# Patient Record
Sex: Female | Born: 1955
Health system: Southern US, Community
[De-identification: ages and names within clinical notes are randomized; demographics above are authoritative.]

## PROBLEM LIST (undated history)

## (undated) DIAGNOSIS — F329 Major depressive disorder, single episode, unspecified: Secondary | ICD-10-CM

## (undated) DIAGNOSIS — M797 Fibromyalgia: Secondary | ICD-10-CM

## (undated) DIAGNOSIS — M549 Dorsalgia, unspecified: Secondary | ICD-10-CM

## (undated) DIAGNOSIS — I341 Nonrheumatic mitral (valve) prolapse: Secondary | ICD-10-CM

## (undated) DIAGNOSIS — F32A Depression, unspecified: Secondary | ICD-10-CM

## (undated) DIAGNOSIS — H18509 Unspecified hereditary corneal dystrophies, unspecified eye: Secondary | ICD-10-CM

## (undated) DIAGNOSIS — M1990 Unspecified osteoarthritis, unspecified site: Secondary | ICD-10-CM

## (undated) DIAGNOSIS — K449 Diaphragmatic hernia without obstruction or gangrene: Secondary | ICD-10-CM

## (undated) DIAGNOSIS — K219 Gastro-esophageal reflux disease without esophagitis: Secondary | ICD-10-CM

## (undated) DIAGNOSIS — R011 Cardiac murmur, unspecified: Secondary | ICD-10-CM

## (undated) DIAGNOSIS — N3090 Cystitis, unspecified without hematuria: Secondary | ICD-10-CM

## (undated) DIAGNOSIS — H9191 Unspecified hearing loss, right ear: Secondary | ICD-10-CM

## (undated) HISTORY — DX: Dorsalgia, unspecified: M54.9

## (undated) HISTORY — PX: TONSILLECTOMY: SUR1361

## (undated) HISTORY — PX: WISDOM TOOTH EXTRACTION: SHX21

## (undated) HISTORY — DX: Nonrheumatic mitral (valve) prolapse: I34.1

## (undated) HISTORY — DX: Cystitis, unspecified without hematuria: N30.90

## (undated) HISTORY — DX: Diaphragmatic hernia without obstruction or gangrene: K44.9

## (undated) HISTORY — DX: Gastro-esophageal reflux disease without esophagitis: K21.9

## (undated) HISTORY — DX: Unspecified hereditary corneal dystrophies, unspecified eye: H18.509

## (undated) HISTORY — PX: COLONOSCOPY: SHX174

## (undated) HISTORY — DX: Unspecified hearing loss, right ear: H91.91

## (undated) HISTORY — PX: EXTERNAL EAR SURGERY: SHX627

---

## 1998-12-01 ENCOUNTER — Emergency Department (HOSPITAL_COMMUNITY): Admission: EM | Admit: 1998-12-01 | Discharge: 1998-12-01 | Payer: Self-pay

## 1998-12-01 ENCOUNTER — Encounter: Payer: Self-pay | Admitting: Emergency Medicine

## 1998-12-01 DIAGNOSIS — M549 Dorsalgia, unspecified: Secondary | ICD-10-CM

## 1998-12-01 HISTORY — DX: Dorsalgia, unspecified: M54.9

## 2002-05-31 ENCOUNTER — Encounter: Admission: RE | Admit: 2002-05-31 | Discharge: 2002-05-31 | Payer: Self-pay | Admitting: Sports Medicine

## 2002-06-28 ENCOUNTER — Encounter: Admission: RE | Admit: 2002-06-28 | Discharge: 2002-06-28 | Payer: Self-pay | Admitting: Sports Medicine

## 2002-07-09 ENCOUNTER — Encounter: Admission: RE | Admit: 2002-07-09 | Discharge: 2002-07-09 | Payer: Self-pay | Admitting: Family Medicine

## 2002-08-02 ENCOUNTER — Encounter: Admission: RE | Admit: 2002-08-02 | Discharge: 2002-08-02 | Payer: Self-pay | Admitting: Sports Medicine

## 2002-08-30 ENCOUNTER — Encounter: Payer: Self-pay | Admitting: Sports Medicine

## 2002-08-30 ENCOUNTER — Encounter: Admission: RE | Admit: 2002-08-30 | Discharge: 2002-08-30 | Payer: Self-pay | Admitting: Sports Medicine

## 2002-09-04 ENCOUNTER — Encounter: Payer: Self-pay | Admitting: Sports Medicine

## 2002-09-04 ENCOUNTER — Encounter: Admission: RE | Admit: 2002-09-04 | Discharge: 2002-09-04 | Payer: Self-pay | Admitting: Sports Medicine

## 2002-09-06 ENCOUNTER — Encounter: Admission: RE | Admit: 2002-09-06 | Discharge: 2002-09-06 | Payer: Self-pay | Admitting: Sports Medicine

## 2002-10-11 ENCOUNTER — Encounter: Admission: RE | Admit: 2002-10-11 | Discharge: 2002-10-11 | Payer: Self-pay | Admitting: Sports Medicine

## 2003-02-12 ENCOUNTER — Other Ambulatory Visit: Admission: RE | Admit: 2003-02-12 | Discharge: 2003-02-12 | Payer: Self-pay | Admitting: Obstetrics and Gynecology

## 2003-06-20 ENCOUNTER — Encounter: Admission: RE | Admit: 2003-06-20 | Discharge: 2003-06-20 | Payer: Self-pay | Admitting: Sports Medicine

## 2004-02-19 ENCOUNTER — Other Ambulatory Visit: Admission: RE | Admit: 2004-02-19 | Discharge: 2004-02-19 | Payer: Self-pay | Admitting: *Deleted

## 2005-06-23 ENCOUNTER — Encounter: Admission: RE | Admit: 2005-06-23 | Discharge: 2005-06-23 | Payer: Self-pay | Admitting: *Deleted

## 2005-06-28 ENCOUNTER — Ambulatory Visit: Payer: Self-pay | Admitting: Sports Medicine

## 2005-07-19 ENCOUNTER — Other Ambulatory Visit: Admission: RE | Admit: 2005-07-19 | Discharge: 2005-07-19 | Payer: Self-pay | Admitting: Obstetrics and Gynecology

## 2006-10-19 ENCOUNTER — Other Ambulatory Visit: Admission: RE | Admit: 2006-10-19 | Discharge: 2006-10-19 | Payer: Self-pay | Admitting: Obstetrics & Gynecology

## 2006-11-22 ENCOUNTER — Encounter: Payer: Self-pay | Admitting: Sports Medicine

## 2006-11-22 ENCOUNTER — Encounter: Admission: RE | Admit: 2006-11-22 | Discharge: 2006-11-22 | Payer: Self-pay | Admitting: Obstetrics and Gynecology

## 2007-08-08 ENCOUNTER — Ambulatory Visit: Payer: Self-pay | Admitting: Sports Medicine

## 2007-08-08 ENCOUNTER — Encounter: Admission: RE | Admit: 2007-08-08 | Discharge: 2007-08-08 | Payer: Self-pay | Admitting: Sports Medicine

## 2007-08-08 DIAGNOSIS — M79609 Pain in unspecified limb: Secondary | ICD-10-CM | POA: Insufficient documentation

## 2007-08-08 DIAGNOSIS — M8580 Other specified disorders of bone density and structure, unspecified site: Secondary | ICD-10-CM | POA: Insufficient documentation

## 2007-08-14 ENCOUNTER — Ambulatory Visit: Payer: Self-pay | Admitting: Sports Medicine

## 2007-08-14 DIAGNOSIS — M7750 Other enthesopathy of unspecified foot: Secondary | ICD-10-CM | POA: Insufficient documentation

## 2007-08-14 DIAGNOSIS — M2170 Unequal limb length (acquired), unspecified site: Secondary | ICD-10-CM | POA: Insufficient documentation

## 2007-08-14 DIAGNOSIS — M21619 Bunion of unspecified foot: Secondary | ICD-10-CM | POA: Insufficient documentation

## 2007-08-17 ENCOUNTER — Encounter (INDEPENDENT_AMBULATORY_CARE_PROVIDER_SITE_OTHER): Payer: Self-pay | Admitting: *Deleted

## 2007-08-28 ENCOUNTER — Encounter: Payer: Self-pay | Admitting: Sports Medicine

## 2007-10-25 ENCOUNTER — Other Ambulatory Visit: Admission: RE | Admit: 2007-10-25 | Discharge: 2007-10-25 | Payer: Self-pay | Admitting: Obstetrics and Gynecology

## 2007-11-28 ENCOUNTER — Encounter: Admission: RE | Admit: 2007-11-28 | Discharge: 2007-11-28 | Payer: Self-pay | Admitting: Obstetrics and Gynecology

## 2008-10-27 ENCOUNTER — Other Ambulatory Visit: Admission: RE | Admit: 2008-10-27 | Discharge: 2008-10-27 | Payer: Self-pay | Admitting: Obstetrics & Gynecology

## 2008-12-16 ENCOUNTER — Encounter: Admission: RE | Admit: 2008-12-16 | Discharge: 2008-12-16 | Payer: Self-pay | Admitting: Obstetrics & Gynecology

## 2008-12-19 ENCOUNTER — Encounter: Admission: RE | Admit: 2008-12-19 | Discharge: 2008-12-19 | Payer: Self-pay | Admitting: Obstetrics & Gynecology

## 2009-10-07 ENCOUNTER — Ambulatory Visit: Payer: Self-pay | Admitting: Cardiology

## 2009-10-07 ENCOUNTER — Observation Stay (HOSPITAL_COMMUNITY): Admission: EM | Admit: 2009-10-07 | Discharge: 2009-10-08 | Payer: Self-pay | Admitting: Emergency Medicine

## 2009-10-08 ENCOUNTER — Encounter (INDEPENDENT_AMBULATORY_CARE_PROVIDER_SITE_OTHER): Payer: Self-pay | Admitting: Emergency Medicine

## 2010-11-16 ENCOUNTER — Encounter
Admission: RE | Admit: 2010-11-16 | Discharge: 2010-11-16 | Payer: Self-pay | Source: Home / Self Care | Attending: Obstetrics & Gynecology | Admitting: Obstetrics & Gynecology

## 2010-11-28 ENCOUNTER — Encounter: Payer: Self-pay | Admitting: Obstetrics & Gynecology

## 2011-02-08 LAB — BASIC METABOLIC PANEL WITH GFR
BUN: 10 mg/dL (ref 6–23)
CO2: 24 meq/L (ref 19–32)
Calcium: 8.5 mg/dL (ref 8.4–10.5)
Chloride: 110 meq/L (ref 96–112)
Creatinine, Ser: 1.03 mg/dL (ref 0.4–1.2)
GFR calc non Af Amer: 56 mL/min — ABNORMAL LOW
Glucose, Bld: 107 mg/dL — ABNORMAL HIGH (ref 70–99)
Potassium: 3.8 meq/L (ref 3.5–5.1)
Sodium: 141 meq/L (ref 135–145)

## 2011-02-08 LAB — POCT CARDIAC MARKERS
CKMB, poc: 1 ng/mL (ref 1.0–8.0)
CKMB, poc: 1.2 ng/mL (ref 1.0–8.0)
CKMB, poc: <1 ng/mL — ABNORMAL LOW (ref 1.0–8.0)
Myoglobin, poc: 36.8 ng/mL (ref 12–200)
Myoglobin, poc: 43.9 ng/mL (ref 12–200)
Myoglobin, poc: 44.1 ng/mL (ref 12–200)
Troponin i, poc: <0.05 ng/mL (ref 0.00–0.09)
Troponin i, poc: <0.05 ng/mL (ref 0.00–0.09)

## 2011-02-08 LAB — CBC
Hemoglobin: 11.7 g/dL — ABNORMAL LOW (ref 12.0–15.0)
MCHC: 33.7 g/dL (ref 30.0–36.0)

## 2011-02-08 LAB — CK TOTAL AND CKMB (NOT AT ARMC)
CK, MB: 1 ng/mL (ref 0.3–4.0)
Total CK: 83 U/L (ref 7–177)

## 2011-02-08 LAB — TROPONIN I

## 2011-02-08 LAB — D-DIMER, QUANTITATIVE

## 2011-11-11 ENCOUNTER — Other Ambulatory Visit: Payer: Self-pay | Admitting: Obstetrics & Gynecology

## 2011-11-11 DIAGNOSIS — Z1231 Encounter for screening mammogram for malignant neoplasm of breast: Secondary | ICD-10-CM

## 2011-11-29 ENCOUNTER — Ambulatory Visit
Admission: RE | Admit: 2011-11-29 | Discharge: 2011-11-29 | Disposition: A | Discharge status: Home / Self Care | Payer: BC Managed Care – PPO | Source: Ambulatory Visit | Attending: Obstetrics & Gynecology | Admitting: Obstetrics & Gynecology

## 2011-11-29 DIAGNOSIS — Z1231 Encounter for screening mammogram for malignant neoplasm of breast: Secondary | ICD-10-CM

## 2012-12-13 ENCOUNTER — Other Ambulatory Visit: Payer: Self-pay | Admitting: Nurse Practitioner

## 2012-12-13 DIAGNOSIS — Z1231 Encounter for screening mammogram for malignant neoplasm of breast: Secondary | ICD-10-CM

## 2012-12-13 DIAGNOSIS — M8580 Other specified disorders of bone density and structure, unspecified site: Secondary | ICD-10-CM

## 2013-01-17 ENCOUNTER — Ambulatory Visit
Admission: RE | Admit: 2013-01-17 | Discharge: 2013-01-17 | Disposition: A | Discharge status: Home / Self Care | Payer: BC Managed Care – PPO | Source: Ambulatory Visit | Attending: Nurse Practitioner | Admitting: Nurse Practitioner

## 2013-01-17 ENCOUNTER — Other Ambulatory Visit: Payer: Self-pay | Admitting: Nurse Practitioner

## 2013-01-17 DIAGNOSIS — M8580 Other specified disorders of bone density and structure, unspecified site: Secondary | ICD-10-CM

## 2013-01-17 DIAGNOSIS — Z1231 Encounter for screening mammogram for malignant neoplasm of breast: Secondary | ICD-10-CM

## 2013-12-10 ENCOUNTER — Other Ambulatory Visit: Payer: Self-pay

## 2013-12-10 DIAGNOSIS — Z1231 Encounter for screening mammogram for malignant neoplasm of breast: Secondary | ICD-10-CM

## 2013-12-19 ENCOUNTER — Encounter: Payer: Self-pay | Admitting: Nurse Practitioner

## 2013-12-19 ENCOUNTER — Ambulatory Visit (INDEPENDENT_AMBULATORY_CARE_PROVIDER_SITE_OTHER): Payer: BC Managed Care – PPO | Admitting: Nurse Practitioner

## 2013-12-19 VITALS — BP 124/76 | HR 84 | Ht 67.25 in | Wt 147.0 lb

## 2013-12-19 DIAGNOSIS — Z01419 Encounter for gynecological examination (general) (routine) without abnormal findings: Secondary | ICD-10-CM

## 2013-12-19 DIAGNOSIS — E559 Vitamin D deficiency, unspecified: Secondary | ICD-10-CM

## 2013-12-19 DIAGNOSIS — Z0000 Encounter for general adult medical examination without abnormal findings: Secondary | ICD-10-CM

## 2013-12-19 LAB — HEMOGLOBIN, FINGERSTICK: Hemoglobin, fingerstick: 12.4 g/dL (ref 12.0–16.0)

## 2013-12-19 MED ORDER — ESTRADIOL-NORETHINDRONE ACET 1-0.5 MG PO TABS: 1.0000 | ORAL_TABLET | Freq: Every day | ORAL | Status: DC

## 2013-12-19 NOTE — Progress Notes (Signed)
Patient ID: Courtney Waters, female   DOB: Oct 19, 1956, 58 y.o.   MRN: [id number] 58 y.o. [id number] Married Caucasian Fe here for annual exam.  Doing well on 1/2 tablet of HRT will now try to decrease dosage even more by the summer.  Mother age 72 with recent fall and in Rehab for a fracture of the cervical neck.  No LMP recorded. Patient is postmenopausal.          Sexually active: yes  The current method of family planning is post menopausal status.    Exercising: yes  [employer] class @ [employer] three times per week Smoker:  no  Health Maintenance: Pap:  12/13/11, WNL, neg HR HPV MMG:  01/17/13, normal Colonoscopy:  10/05/05 will schedule BMD:   01/2013, -0.6/1.0 TDaP:  01/24/07 Labs: HB:  12.4 Urine:  Negative   reports that she has never smoked. She has never used smokeless tobacco. She reports that she drinks about 1.0 ounces of alcohol per week. She reports that she does not use illicit drugs.  Past Medical History  Diagnosis Date  . Hearing loss in right ear   . MVP (mitral valve prolapse)   . Back pain 12/01/1998    compression fracture L1 from sledding accident    Past Surgical History  Procedure Laterality Date  . Tonsillectomy  age 30  . Wisdom tooth extraction  age 57  . 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 Biotin 5000 MCG CAPS Take 1 capsule by mouth daily.      . Cholecalciferol (VITAMIN D) 2000 UNITS CAPS Take 1 capsule by mouth daily.      . DULoxetine (CYMBALTA) 60 MG capsule Take 1 capsule by mouth daily.      Marland Kitchen estradiol-norethindrone (MIMVEY) 1-0.5 MG per tablet Take 1 tablet by mouth daily.  90 tablet  1  . methocarbamol (ROBAXIN) 500 MG tablet Take 1 tablet by mouth 3 (three) times daily.      Marland Kitchen oxyCODONE-acetaminophen (PERCOCET/ROXICET) 5-325 MG per tablet Take 1 tablet by mouth as needed.      . Turmeric 500 MG  CAPS Take 1 capsule by mouth daily.       No current facility-administered medications for this visit.    Family History  Problem Relation Age of Onset  . Diabetes Mother   . Hearing loss Mother   . Cancer Father     GI  . Breast cancer Maternal Aunt   . Heart disease Sister   . Hearing loss Brother     ROS:  Pertinent items are noted in HPI.  Otherwise, a comprehensive ROS was negative.  Exam:   BP 124/76  Pulse 84  Ht 5' 7.25" (1.708 m)  Wt 147 lb (66.679 kg)  BMI 22.86 kg/m2 Height: 5' 7.25" (170.8 cm)  Ht Readings from Last 3 Encounters:  12/19/13 5' 7.25" (1.708 m)    General appearance: alert, cooperative and appears stated age Head: Normocephalic, without obvious abnormality, atraumatic Neck: no adenopathy, supple, symmetrical, trachea midline and thyroid normal to inspection and palpation Lungs: clear to auscultation bilaterally Breasts: normal appearance, no masses or tenderness Heart: regular rate and rhythm Abdomen: soft, non-tender; no masses,  no organomegaly Extremities: extremities normal, atraumatic, no cyanosis or edema Skin: Skin color, texture, turgor normal. No rashes or lesions Lymph nodes: Cervical, supraclavicular, and  axillary nodes normal. No abnormal inguinal nodes palpated Neurologic: Grossly normal   Pelvic: External genitalia:  no lesions              Urethra:  normal appearing urethra with no masses, tenderness or lesions              Bartholin's and Skene's: normal                 Vagina: normal appearing vagina with normal color and discharge, no lesions              Cervix: anteverted              Pap taken: no Bimanual Exam:  Uterus:  normal size, contour, position, consistency, mobility, non-tender              Adnexa: no mass, fullness, tenderness               Rectovaginal: Confirms               Anus:  normal sphincter tone, no lesions  A:  Well Woman with normal exam  Postmenopausal on HRT  Tapering dose of HRT  History of  fibromyalgia and chronic back pain  Vit D deficiency  P:   Pap smear as per guidelines   Mammogram due 01/2014  Refill on HRT for a year, will hopefully be tapering off by the summer if tolerated.  Counseled on breast self exam, mammography screening, adequate intake of calcium and vitamin D, diet and exercise return annually or prn  An After Visit Summary was printed and given to the patient.

## 2013-12-19 NOTE — Patient Instructions (Signed)

## 2013-12-20 LAB — VITAMIN D 25 HYDROXY (VIT D DEFICIENCY, FRACTURES): VIT D 25 HYDROXY: 49 ng/mL (ref 30–89)

## 2013-12-24 ENCOUNTER — Telehealth: Payer: Self-pay | Admitting: *Deleted

## 2013-12-24 NOTE — Telephone Encounter (Signed)
Message copied by Graylon Good on Tue Dec 24, 2013 12:20 PM ------      Message from: Kem Boroughs R      Created: Mon Dec 23, 2013 10:16 AM       Let patient know that Vit D is in a good range @ her OTC dosing of VIt D ------

## 2013-12-24 NOTE — Progress Notes (Signed)
Encounter reviewed by Dr. Brook Silva.  

## 2013-12-24 NOTE — Telephone Encounter (Signed)
I have attempted to contact this patient by phone with the following results: no answer.

## 2014-01-09 NOTE — Telephone Encounter (Signed)
Pt notified in result note on 12/31/13.

## 2014-01-21 ENCOUNTER — Ambulatory Visit
Admission: RE | Admit: 2014-01-21 | Discharge: 2014-01-21 | Disposition: A | Discharge status: Home / Self Care | Payer: BC Managed Care – PPO | Source: Ambulatory Visit

## 2014-01-21 DIAGNOSIS — Z1231 Encounter for screening mammogram for malignant neoplasm of breast: Secondary | ICD-10-CM

## 2014-04-15 ENCOUNTER — Other Ambulatory Visit: Payer: Self-pay | Admitting: Internal Medicine

## 2014-04-15 DIAGNOSIS — IMO0002 Reserved for concepts with insufficient information to code with codable children: Secondary | ICD-10-CM

## 2014-04-15 DIAGNOSIS — M545 Low back pain, unspecified: Secondary | ICD-10-CM

## 2014-04-17 ENCOUNTER — Ambulatory Visit
Admission: RE | Admit: 2014-04-17 | Discharge: 2014-04-17 | Disposition: A | Discharge status: Home / Self Care | Payer: BC Managed Care – PPO | Source: Ambulatory Visit | Attending: Internal Medicine | Admitting: Internal Medicine

## 2014-04-17 DIAGNOSIS — M545 Low back pain, unspecified: Secondary | ICD-10-CM

## 2014-04-17 DIAGNOSIS — IMO0002 Reserved for concepts with insufficient information to code with codable children: Secondary | ICD-10-CM

## 2014-04-24 ENCOUNTER — Telehealth: Payer: Self-pay | Admitting: Obstetrics & Gynecology

## 2014-04-24 DIAGNOSIS — N83209 Unspecified ovarian cyst, unspecified side: Secondary | ICD-10-CM

## 2014-04-24 NOTE — Telephone Encounter (Signed)
Patient had MR lumbar spine wo contrast on 6/11. Results in EPIC. Was advised that PUS is recommended.   Routing to [place] for review. Okay to schedule PUS at this time?  CC: Milford Cage, [place]

## 2014-04-24 NOTE — Telephone Encounter (Signed)
Pt had a MRI done and was told she had an ovarian cyst. Pt was told she should have an ultrasound sound done.

## 2014-04-25 NOTE — Telephone Encounter (Signed)
Spoke with patient. Advised of message as seen below from [place]. Patient agreeable. PUS scheduled for 6/25 at 3:00pm and consult at 3:30 with Dr.Miller. Patient agreeable to date and time. Advised would be contacted by billing regarding benefits if anything more than office copay is required for visit. Patient is agreeable.  PUS order placed for precert.  Routing to provider for final review. Patient agreeable to disposition. Will close encounter

## 2014-04-25 NOTE — Telephone Encounter (Signed)
Yes.  Please schedule PUS.  This will most likely need removal so needs to be on my or Dr. Elza Rafter schedule.

## 2014-05-01 ENCOUNTER — Ambulatory Visit (INDEPENDENT_AMBULATORY_CARE_PROVIDER_SITE_OTHER): Payer: BC Managed Care – PPO

## 2014-05-01 ENCOUNTER — Other Ambulatory Visit: Payer: Self-pay | Admitting: Obstetrics & Gynecology

## 2014-05-01 ENCOUNTER — Ambulatory Visit (INDEPENDENT_AMBULATORY_CARE_PROVIDER_SITE_OTHER): Payer: BC Managed Care – PPO | Admitting: Obstetrics & Gynecology

## 2014-05-01 VITALS — BP 120/78 | Ht 67.25 in | Wt 150.4 lb

## 2014-05-01 DIAGNOSIS — N839 Noninflammatory disorder of ovary, fallopian tube and broad ligament, unspecified: Secondary | ICD-10-CM

## 2014-05-01 DIAGNOSIS — N83209 Unspecified ovarian cyst, unspecified side: Secondary | ICD-10-CM

## 2014-05-01 DIAGNOSIS — N838 Other noninflammatory disorders of ovary, fallopian tube and broad ligament: Secondary | ICD-10-CM

## 2014-05-01 NOTE — Progress Notes (Signed)
58 y.o.Marriedfemale G2P2 her for PUS to further evaluate an enlarged cystic ovarian mass found on MRI of the spine.  This was done due to increased pian of the back.  Pt has fibromyalgia and lives with chronic pain, however, the back has been much worse lately.  MRI was done 04/17/14.  No LMP recorded. Patient is postmenopausal.  Sexually active:  yes  Contraception: PMP status  FINDINGS: UTERUS: 5.9 x 3.2 x 2.8cm without fibroids EMS: 2.4 mm ADNEXA:   Left ovary cannot be clearly seen but a 12.5 x 8.9 x 7.5cm avascular, cystic, lesion is seen.  A few thin septations are noted. These are avascular as well.   Right ovary 1.7 x 1.0 x 1.0cm CUL DE SAC: no free fluid in upper or lower abdomen  Images reviewed with pt.  Feel this is either a serous or mucinous cystadenoma.  Will obtain a ca-125 first.  If normal, plan to proceed with laparoscopic LSO vs BSO.  I have encouraged pt to consider having both ovaries removed as this minimally increases risks of surgery and length of surgery.  She is agreeable.   Will want to come back wit spouse for discussion before surgery.  Reviewed all parts of ultrasound with pt, specifically what makes me think this is benign.  Surgery reviewed.  Hospital stay discussed.  Risks and benefits discussed including infection, bleeding, transfusion, bowel/bladder/ureteral injury, DVT.  Pt voices understanding.  All questions answered.  Assessment:  Large 12.5cm left cystic adnexal mass Plan: ca-125.  If normal, plan to proceed with surgical removal.  ~25 minutes spent with patient >50% of time was in face to face discussion of above.

## 2014-05-02 LAB — CA 125: CA 125: 19.3 U/mL (ref 0.0–30.2)

## 2014-05-05 ENCOUNTER — Telehealth: Payer: Self-pay | Admitting: Obstetrics & Gynecology

## 2014-05-05 NOTE — Telephone Encounter (Signed)
Spoke with pt personally.  Will try to proceed with getting surgery scheduled next week, if possible.

## 2014-05-05 NOTE — Telephone Encounter (Signed)
Routing to [place] for review and advise of lab from 6/25.

## 2014-05-05 NOTE — Telephone Encounter (Signed)
Patient is calling saying kelly was supposed to call her Friday with results from blood work and she hasn't gotten a call yet.

## 2014-05-05 NOTE — Telephone Encounter (Signed)
Patient calling regarding results. Advised result information is still pending Dr.Miller's review. Advised would call back as soon as Dr.Miller has had a chance to review. Patient would like call back before the end of the day today.

## 2014-05-06 ENCOUNTER — Telehealth: Payer: Self-pay | Admitting: *Deleted

## 2014-05-06 NOTE — Telephone Encounter (Signed)
Call to patient, notified of surgery scheudled for Tuesday 05-13-14 at 1300 at [facility]. Surgery instruction sheet reviewed and will mail.  Consult with Dr Sabra Heck scheduled for 05-12-14.  Routing to provider for final review. Patient agreeable to disposition. Will close encounter

## 2014-05-06 NOTE — Telephone Encounter (Signed)
Surgery is scheduled.  Encounter closed.

## 2014-05-07 ENCOUNTER — Telehealth: Payer: Self-pay | Admitting: Obstetrics & Gynecology

## 2014-05-07 NOTE — Telephone Encounter (Signed)
Spoke with patient. Advised that per benefit quote received, she will be responsible for $1610.70 for the surgeons portion of her surgery. Patient requested that I call her back this evening because she has fibromyalgia and starts kind of slow in the morning. Agreed to call patient back at 1530.

## 2014-05-08 ENCOUNTER — Encounter (HOSPITAL_COMMUNITY): Payer: Self-pay | Admitting: *Deleted

## 2014-05-08 NOTE — Telephone Encounter (Signed)
Spoke with patient. Reiterated out of pocket amount due for the surgeons portion of her surgery. Advised that payment is due in full prior to scheduled surgery. Patient states that her husband would like to speak with someone in the office about setting up a payment arrangement for this amount. Passed call to [place].

## 2014-05-08 NOTE — Telephone Encounter (Signed)
Per notes in EPIC Account Notes: Patient asked that I speak with husband to set up payment arrangement for surgery. Paid $610.72 today and will pay remainder balance of $1,000.00 within 2 months. [place]

## 2014-05-08 NOTE — Telephone Encounter (Signed)
Promissory note drafted for patient to sign at pre-op appt 05/12/2014:: PROMISSORY NOTE  This Promissory Note is entered into by:  Shelda Pal  of [place]. [place], [place] [address].  [place], [address], [place], [place], [place]  [address]  The patient or responsible party agrees to pay the sum of $1000.00  to [facility].  Payments are to be made on a monthly basis beginning: June 08, 2014 continuing (monthly payments will be $500.00 ) until the balance is paid in full.  Patient and/or responsible party understands that payment must be made every thirty (30) days regardless of receipt of a statement.  Patient and/or responsible party understands that any time payments are not made as required, account may be turned over to a collection agency with no further notice.  Any and all fees assessed due to collection agency referral will be the responsibility of the patient and/or responsible party.  Patient and/or responsible party understands that any Medicaid benefits are not a permissible form of payment now or in the future.

## 2014-05-08 NOTE — Patient Instructions (Addendum)
   Your procedure is scheduled on:  Tuesday, July 7   Enter through the [employer] [facility] at:  [place] up the phone at the desk and dial [phone] and inform us of your arrival.  Please call this number if you have any problems the morning of surgery: [phone]  Remember: Do not eat food after midnight:  Monday Do not drink clear liquids after:  9 AM Tuesday, day of surgery Take these medicines the morning of surgery with a SIP OF WATER:  Cymbalta, percocet and robaxin if needed.  Do not wear jewelry, make-up, or FINGER nail polish No metal in your hair or on your body. Do not wear lotions, powders, perfumes.  You may wear deodorant.  Do not bring valuables to the hospital. Contacts, dentures or bridgework may not be worn into surgery.  Leave suitcase in the car. After Surgery it may be brought to your room. For patients being admitted to the hospital, checkout time is 11:00am the day of discharge.    Patients discharged on the day of surgery will not be allowed to drive home.  Home with husband Rob cell [phone].

## 2014-05-09 ENCOUNTER — Encounter (HOSPITAL_COMMUNITY): Payer: Self-pay | Admitting: Pharmacist

## 2014-05-12 ENCOUNTER — Encounter (HOSPITAL_COMMUNITY)
Admission: RE | Admit: 2014-05-12 | Discharge: 2014-05-12 | Disposition: A | Discharge status: Home / Self Care | Payer: BC Managed Care – PPO | Source: Ambulatory Visit | Attending: Obstetrics & Gynecology | Admitting: Obstetrics & Gynecology

## 2014-05-12 ENCOUNTER — Encounter (HOSPITAL_COMMUNITY): Payer: Self-pay

## 2014-05-12 ENCOUNTER — Encounter: Payer: Self-pay | Admitting: Obstetrics & Gynecology

## 2014-05-12 ENCOUNTER — Ambulatory Visit (INDEPENDENT_AMBULATORY_CARE_PROVIDER_SITE_OTHER): Payer: BC Managed Care – PPO | Admitting: Obstetrics & Gynecology

## 2014-05-12 VITALS — BP 102/64 | HR 64 | Resp 16 | Wt 149.4 lb

## 2014-05-12 DIAGNOSIS — N838 Other noninflammatory disorders of ovary, fallopian tube and broad ligament: Secondary | ICD-10-CM

## 2014-05-12 DIAGNOSIS — N839 Noninflammatory disorder of ovary, fallopian tube and broad ligament, unspecified: Secondary | ICD-10-CM

## 2014-05-12 HISTORY — DX: Major depressive disorder, single episode, unspecified: F32.9

## 2014-05-12 HISTORY — DX: Unspecified osteoarthritis, unspecified site: M19.90

## 2014-05-12 HISTORY — DX: Depression, unspecified: F32.A

## 2014-05-12 LAB — CBC
HEMATOCRIT: 36.7 % (ref 36.0–46.0)
HEMOGLOBIN: 11.9 g/dL — AB (ref 12.0–15.0)
MCH: 27.9 pg (ref 26.0–34.0)
MCHC: 32.4 g/dL (ref 30.0–36.0)
MCV: 85.9 fL (ref 78.0–100.0)
Platelets: 312 10*3/uL (ref 150–400)
RBC: 4.27 MIL/uL (ref 3.87–5.11)
RDW: 12.6 % (ref 11.5–15.5)
WBC: 5.9 10*3/uL (ref 4.0–10.5)

## 2014-05-12 MED ORDER — OXYCODONE-ACETAMINOPHEN 5-325 MG PO TABS: 1.0000 | ORAL_TABLET | ORAL | Status: DC | PRN

## 2014-05-12 NOTE — Addendum Note (Signed)
Addended by: Megan Salon on: 05/12/2014 04:58 PM   Modules accepted: Orders

## 2014-05-12 NOTE — Progress Notes (Signed)
58 y.o. [id number] MarriedCaucasian female here for discussion of upcoming procedure.  Laparoscopic bilateral salpingo oophorectomy planned due to 12 cm cystic lesion seen initially on MRI done due to low back pain.  Lesion was not completely seen.  She was advised to follow up with gynecologist.  Ultrasound performed last week showed 12.5 x 9 x 7.5cm lesion with septations.  Lesion is avascular and smooth walled.  No free fluid was noted.  Pt wishes for quick removal due to some plans at the end of the month.  Procedure discussed with patient.  Hospital stay, recovery and pain management all discussed.  Risks discussed including but not limited to bleeding, 1% risk of receiving a  transfusion, infection, 1-2% risk of bowel/bladder/ureteral/vascular injury discussed as well as possible need for additional surgery if injury does occur discussed.  DVT/PE and rare risk of death discussed.  My actual complications with prior surgeries discussed.  Hernia formation discussed.  Positioning and incision locations discussed.  Patient aware if pathology abnormal she may need additional treatment.  All questions answered.    Spouse accompanied patient and also had many questions which were addressed.  Ob Hx:   No LMP recorded. Patient is postmenopausal.          Sexually active: Yes.   Birth control: PMP state Last pap: 12/13/11 WNL/negative HR HPV Last MMG: 01/21/14 3D-normal  Tobacco: none  Past Surgical History  Procedure Laterality Date  . Tonsillectomy  age 38  . Wisdom tooth extraction  age 58  . 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 Colonoscopy      Past Medical History  Diagnosis Date  . Hearing loss in right ear   . MVP (mitral valve prolapse)     Hx  . Back pain 12/01/1998    compression fracture L1 from sledding accident  . SVD (spontaneous vaginal delivery)     x 2  . Depression   . Arthritis     hands  and neck  . Fibromyalgia     Allergies: Review of patient's allergies indicates no known allergies.  Current Outpatient Prescriptions  Medication Sig Dispense Refill  . Biotin 5000 MCG CAPS Take 1 capsule by mouth daily.      . Calcium Citrate-Vitamin D (CALCIUM CITRATE + D3 MAXIMUM) 315-250 MG-UNIT TABS Take 1 tablet by mouth daily.      . Cholecalciferol (VITAMIN D) 2000 UNITS CAPS Take 1 capsule by mouth daily.      . DULoxetine (CYMBALTA) 60 MG capsule Take 1 capsule by mouth daily.      Marland Kitchen estradiol-norethindrone (ACTIVELLA) 1-0.5 MG per tablet Take 0.5 tablets by mouth every other day. Takes on even days      . methocarbamol (ROBAXIN) 500 MG tablet Take 1 tablet by mouth 3 (three) times daily as needed for muscle spasms.       Marland Kitchen oxyCODONE-acetaminophen (PERCOCET/ROXICET) 5-325 MG per tablet Take 1 tablet by mouth every 6 (six) hours as needed for moderate pain.        No current facility-administered medications for this visit.    ROS: A comprehensive review of systems was negative.  Exam:    BP 102/64  Pulse 64  Resp 16  Wt 149 lb 6.4 oz (67.767 kg)  General appearance: alert and cooperative Head: Normocephalic, without obvious abnormality, atraumatic Neck: no adenopathy, supple, symmetrical, trachea midline and thyroid not enlarged, symmetric,  no tenderness/mass/nodules Lungs: clear to auscultation bilaterally Heart: regular rate and rhythm, S1, S2 normal, no murmur, click, rub or gallop Abdomen: soft, non-tender; bowel sounds normal; no masses,  no organomegaly Extremities: extremities normal, atraumatic, no cyanosis or edema Skin: Skin color, texture, turgor normal. No rashes or lesions Lymph nodes: Cervical, supraclavicular, and axillary nodes normal. no inguinal nodes palpated Neurologic: Grossly normal  Pelvic: External genitalia:  no lesions              Urethra: normal appearing urethra with no masses, tenderness or lesions              Bartholins and Skenes:  normal                 Vagina: normal appearing vagina with normal color and discharge, no lesions              Cervix: normal appearance              Pap taken: No.        Bimanual Exam:  Uterus:  uterus is normal size, shape, consistency and nontender                                      Adnexa:    Left fullness, non tender, no right sided mass                                      Rectovaginal: Deferred                                      Anus:  defer exam  A: Left adnexal mass, 12 cm    P:  Bilateral salpingoophrectomy planned as pt wishes both ovaries to be removed Rx for Percocet given. Medications/Vitamins reviewed. Pt has stopped her vitamins. Orders placed.  ~30 minutes spent with patient >50% of time was in face to face discussion of above.

## 2014-05-13 ENCOUNTER — Encounter (HOSPITAL_COMMUNITY): Payer: Self-pay | Admitting: Anesthesiology

## 2014-05-13 ENCOUNTER — Ambulatory Visit (HOSPITAL_COMMUNITY)
Admission: RE | Admit: 2014-05-13 | Discharge: 2014-05-13 | Disposition: A | Discharge status: Home / Self Care | Payer: BC Managed Care – PPO | Source: Ambulatory Visit | Attending: Obstetrics & Gynecology | Admitting: Obstetrics & Gynecology

## 2014-05-13 ENCOUNTER — Encounter (HOSPITAL_COMMUNITY)
Admission: RE | Disposition: A | Discharge status: Home / Self Care | Payer: Self-pay | Source: Ambulatory Visit | Attending: Obstetrics & Gynecology

## 2014-05-13 ENCOUNTER — Ambulatory Visit (HOSPITAL_COMMUNITY): Payer: BC Managed Care – PPO | Admitting: Anesthesiology

## 2014-05-13 ENCOUNTER — Encounter (HOSPITAL_COMMUNITY): Payer: BC Managed Care – PPO | Admitting: Anesthesiology

## 2014-05-13 DIAGNOSIS — N838 Other noninflammatory disorders of ovary, fallopian tube and broad ligament: Secondary | ICD-10-CM

## 2014-05-13 DIAGNOSIS — F3289 Other specified depressive episodes: Secondary | ICD-10-CM | POA: Insufficient documentation

## 2014-05-13 DIAGNOSIS — M545 Low back pain, unspecified: Secondary | ICD-10-CM

## 2014-05-13 DIAGNOSIS — Z833 Family history of diabetes mellitus: Secondary | ICD-10-CM | POA: Insufficient documentation

## 2014-05-13 DIAGNOSIS — I059 Rheumatic mitral valve disease, unspecified: Secondary | ICD-10-CM | POA: Insufficient documentation

## 2014-05-13 DIAGNOSIS — D3910 Neoplasm of uncertain behavior of unspecified ovary: Secondary | ICD-10-CM

## 2014-05-13 DIAGNOSIS — F329 Major depressive disorder, single episode, unspecified: Secondary | ICD-10-CM | POA: Insufficient documentation

## 2014-05-13 DIAGNOSIS — IMO0001 Reserved for inherently not codable concepts without codable children: Secondary | ICD-10-CM | POA: Insufficient documentation

## 2014-05-13 DIAGNOSIS — D279 Benign neoplasm of unspecified ovary: Secondary | ICD-10-CM | POA: Insufficient documentation

## 2014-05-13 HISTORY — DX: Fibromyalgia: M79.7

## 2014-05-13 HISTORY — PX: LAPAROSCOPIC BILATERAL SALPINGO OOPHERECTOMY: SHX5890

## 2014-05-13 SURGERY — SALPINGO-OOPHORECTOMY, BILATERAL, LAPAROSCOPIC
Anesthesia: General | Site: Abdomen | Laterality: Bilateral

## 2014-05-13 MED ORDER — ROCURONIUM BROMIDE 100 MG/10ML IV SOLN
INTRAVENOUS | Status: AC
  Filled 2014-05-13: qty 1

## 2014-05-13 MED ORDER — ONDANSETRON HCL 4 MG/2ML IJ SOLN
INTRAMUSCULAR | Status: AC
  Filled 2014-05-13: qty 2

## 2014-05-13 MED ORDER — OXYCODONE HCL 5 MG PO TABS: 5.0000 mg | ORAL_TABLET | Freq: Once | ORAL | Status: DC | PRN

## 2014-05-13 MED ORDER — GLYCOPYRROLATE 0.2 MG/ML IJ SOLN
INTRAMUSCULAR | Status: AC
  Filled 2014-05-13: qty 1

## 2014-05-13 MED ORDER — FENTANYL CITRATE 0.05 MG/ML IJ SOLN
INTRAMUSCULAR | Status: DC | PRN
  Administered 2014-05-13: 100 ug via INTRAVENOUS
  Administered 2014-05-13: 50 ug via INTRAVENOUS
  Administered 2014-05-13 (×2): 25 ug via INTRAVENOUS

## 2014-05-13 MED ORDER — MIDAZOLAM HCL 2 MG/2ML IJ SOLN
INTRAMUSCULAR | Status: AC
  Filled 2014-05-13: qty 2

## 2014-05-13 MED ORDER — DEXAMETHASONE SODIUM PHOSPHATE 10 MG/ML IJ SOLN
INTRAMUSCULAR | Status: AC
  Filled 2014-05-13: qty 1

## 2014-05-13 MED ORDER — LIDOCAINE HCL (CARDIAC) 20 MG/ML IV SOLN
INTRAVENOUS | Status: DC | PRN
  Administered 2014-05-13: 80 mg via INTRAVENOUS

## 2014-05-13 MED ORDER — DIPHENHYDRAMINE HCL 50 MG/ML IJ SOLN
INTRAMUSCULAR | Status: AC
Start: 2014-05-13 — End: 2014-05-13
  Filled 2014-05-13: qty 1

## 2014-05-13 MED ORDER — EPHEDRINE SULFATE 50 MG/ML IJ SOLN
INTRAMUSCULAR | Status: DC | PRN
  Administered 2014-05-13: 5 mg via INTRAVENOUS
  Administered 2014-05-13: 10 mg via INTRAVENOUS

## 2014-05-13 MED ORDER — NEOSTIGMINE METHYLSULFATE 10 MG/10ML IV SOLN
INTRAVENOUS | Status: AC
  Filled 2014-05-13: qty 1

## 2014-05-13 MED ORDER — DIPHENHYDRAMINE HCL 50 MG/ML IJ SOLN
INTRAMUSCULAR | Status: DC | PRN
  Administered 2014-05-13: 12.5 mg via INTRAVENOUS

## 2014-05-13 MED ORDER — BUPIVACAINE HCL (PF) 0.25 % IJ SOLN
INTRAMUSCULAR | Status: AC
  Filled 2014-05-13: qty 30

## 2014-05-13 MED ORDER — LACTATED RINGERS IR SOLN
Status: DC | PRN
  Administered 2014-05-13: 3000 mL

## 2014-05-13 MED ORDER — PROPOFOL 10 MG/ML IV BOLUS
INTRAVENOUS | Status: DC | PRN
  Administered 2014-05-13: 200 mg via INTRAVENOUS

## 2014-05-13 MED ORDER — EPHEDRINE 5 MG/ML INJ
INTRAVENOUS | Status: AC
  Filled 2014-05-13: qty 10

## 2014-05-13 MED ORDER — GLYCOPYRROLATE 0.2 MG/ML IJ SOLN
INTRAMUSCULAR | Status: AC
  Filled 2014-05-13: qty 3

## 2014-05-13 MED ORDER — ROCURONIUM BROMIDE 100 MG/10ML IV SOLN
INTRAVENOUS | Status: DC | PRN
  Administered 2014-05-13: 30 mg via INTRAVENOUS

## 2014-05-13 MED ORDER — BUPIVACAINE HCL (PF) 0.25 % IJ SOLN
INTRAMUSCULAR | Status: DC | PRN
  Administered 2014-05-13: 9 mL

## 2014-05-13 MED ORDER — LIDOCAINE HCL (CARDIAC) 20 MG/ML IV SOLN
INTRAVENOUS | Status: AC
Start: 2014-05-13 — End: 2014-05-13
  Filled 2014-05-13: qty 5

## 2014-05-13 MED ORDER — FENTANYL CITRATE 0.05 MG/ML IJ SOLN
25.0000 ug | INTRAMUSCULAR | Status: DC | PRN
  Administered 2014-05-13 (×4): 25 ug via INTRAVENOUS

## 2014-05-13 MED ORDER — DEXTROSE 5 % IV SOLN
2.0000 g | INTRAVENOUS | Status: AC
  Administered 2014-05-13: 2 g via INTRAVENOUS
  Filled 2014-05-13: qty 2

## 2014-05-13 MED ORDER — KETOROLAC TROMETHAMINE 30 MG/ML IJ SOLN
INTRAMUSCULAR | Status: AC
  Filled 2014-05-13: qty 1

## 2014-05-13 MED ORDER — LACTATED RINGERS IV SOLN
INTRAVENOUS | Status: DC
  Administered 2014-05-13 (×2): via INTRAVENOUS

## 2014-05-13 MED ORDER — FENTANYL CITRATE 0.05 MG/ML IJ SOLN
INTRAMUSCULAR | Status: AC
  Filled 2014-05-13: qty 5

## 2014-05-13 MED ORDER — NEOSTIGMINE METHYLSULFATE 10 MG/10ML IV SOLN
INTRAVENOUS | Status: DC | PRN
  Administered 2014-05-13: 1 mg via INTRAVENOUS

## 2014-05-13 MED ORDER — OXYCODONE HCL 5 MG/5ML PO SOLN: 5.0000 mg | Freq: Once | ORAL | Status: DC | PRN

## 2014-05-13 MED ORDER — MIDAZOLAM HCL 2 MG/2ML IJ SOLN
INTRAMUSCULAR | Status: DC | PRN
  Administered 2014-05-13: 2 mg via INTRAVENOUS

## 2014-05-13 MED ORDER — FENTANYL CITRATE 0.05 MG/ML IJ SOLN
INTRAMUSCULAR | Status: AC
  Administered 2014-05-13: 25 ug via INTRAVENOUS
  Filled 2014-05-13: qty 2

## 2014-05-13 MED ORDER — METOCLOPRAMIDE HCL 5 MG/ML IJ SOLN: 10.0000 mg | Freq: Once | INTRAMUSCULAR | Status: DC | PRN

## 2014-05-13 MED ORDER — MEPERIDINE HCL 25 MG/ML IJ SOLN: 6.2500 mg | INTRAMUSCULAR | Status: DC | PRN

## 2014-05-13 MED ORDER — KETOROLAC TROMETHAMINE 30 MG/ML IJ SOLN
INTRAMUSCULAR | Status: DC | PRN
  Administered 2014-05-13: 15 mg via INTRAVENOUS

## 2014-05-13 MED ORDER — ONDANSETRON HCL 4 MG/2ML IJ SOLN
INTRAMUSCULAR | Status: DC | PRN
  Administered 2014-05-13: 4 mg via INTRAVENOUS

## 2014-05-13 MED ORDER — GLYCOPYRROLATE 0.2 MG/ML IJ SOLN
INTRAMUSCULAR | Status: DC | PRN
  Administered 2014-05-13 (×2): 0.2 mg via INTRAVENOUS

## 2014-05-13 MED ORDER — PROPOFOL 10 MG/ML IV EMUL
INTRAVENOUS | Status: AC
  Filled 2014-05-13: qty 20

## 2014-05-13 MED ORDER — DEXAMETHASONE SODIUM PHOSPHATE 10 MG/ML IJ SOLN
INTRAMUSCULAR | Status: DC | PRN
  Administered 2014-05-13: 10 mg via INTRAVENOUS

## 2014-05-13 SURGICAL SUPPLY — 40 items
ADH SKN CLS APL DERMABOND .7 (GAUZE/BANDAGES/DRESSINGS) ×1
APL SKNCLS STERI-STRIP NONHPOA (GAUZE/BANDAGES/DRESSINGS)
BAG SPEC RTRVL LRG 6X4 10 (ENDOMECHANICALS)
BENZOIN TINCTURE PRP APPL 2/3 (GAUZE/BANDAGES/DRESSINGS) IMPLANT
CABLE HIGH FREQUENCY MONO STRZ (ELECTRODE) ×3 IMPLANT
CATH ROBINSON RED A/P 16FR (CATHETERS) IMPLANT
CHLORAPREP W/TINT 26ML (MISCELLANEOUS) ×2 IMPLANT
CLOTH BEACON ORANGE TIMEOUT ST (SAFETY) ×2 IMPLANT
DERMABOND ADVANCED (GAUZE/BANDAGES/DRESSINGS) ×1
DERMABOND ADVANCED .7 DNX12 (GAUZE/BANDAGES/DRESSINGS) IMPLANT
DILATOR CANAL MILEX (MISCELLANEOUS) ×1 IMPLANT
DRSG COVADERM PLUS 2X2 (GAUZE/BANDAGES/DRESSINGS) ×3 IMPLANT
DRSG OPSITE POSTOP 3X4 (GAUZE/BANDAGES/DRESSINGS) ×1 IMPLANT
EVACUATOR SMOKE 8.L (FILTER) ×1 IMPLANT
FORCEPS CUTTING 33CM 5MM (CUTTING FORCEPS) ×1 IMPLANT
GLOVE BIOGEL PI IND STRL 7.0 (GLOVE) ×2 IMPLANT
GLOVE BIOGEL PI INDICATOR 7.0 (GLOVE) ×2
GLOVE ECLIPSE 6.5 STRL STRAW (GLOVE) ×4 IMPLANT
GOWN STRL REUS W/TWL LRG LVL3 (GOWN DISPOSABLE) ×2 IMPLANT
NEEDLE INSUFFLATION 120MM (ENDOMECHANICALS) ×2 IMPLANT
PACK LAPAROSCOPY BASIN (CUSTOM PROCEDURE TRAY) ×2 IMPLANT
POUCH ENDO CATCH II 15MM (MISCELLANEOUS) ×1 IMPLANT
POUCH SPECIMEN RETRIEVAL 10MM (ENDOMECHANICALS) IMPLANT
PROTECTOR NERVE ULNAR (MISCELLANEOUS) ×2 IMPLANT
SEALER TISSUE G2 CVD JAW 35 (ENDOMECHANICALS) IMPLANT
SEALER TISSUE G2 CVD JAW 45CM (ENDOMECHANICALS)
SET IRRIG TUBING LAPAROSCOPIC (IRRIGATION / IRRIGATOR) ×1 IMPLANT
STRIP CLOSURE SKIN 1/2X4 (GAUZE/BANDAGES/DRESSINGS) IMPLANT
STRIP CLOSURE SKIN 1/4X4 (GAUZE/BANDAGES/DRESSINGS) ×2 IMPLANT
SUT VICRYL 0 UR6 27IN ABS (SUTURE) IMPLANT
SUT VICRYL 4-0 PS2 18IN ABS (SUTURE) ×2 IMPLANT
SYR 30ML LL (SYRINGE) IMPLANT
TOWEL OR 17X24 6PK STRL BLUE (TOWEL DISPOSABLE) ×4 IMPLANT
TRAY FOLEY CATH 14FR (SET/KITS/TRAYS/PACK) ×2 IMPLANT
TROCAR BALLN 12MMX100 BLUNT (TROCAR) IMPLANT
TROCAR BLADELESS 15MM (ENDOMECHANICALS) ×1 IMPLANT
TROCAR XCEL NON-BLD 11X100MML (ENDOMECHANICALS) ×1 IMPLANT
TROCAR XCEL NON-BLD 5MMX100MML (ENDOMECHANICALS) ×3 IMPLANT
WARMER LAPAROSCOPE (MISCELLANEOUS) ×2 IMPLANT
WATER STERILE IRR 1000ML POUR (IV SOLUTION) ×2 IMPLANT

## 2014-05-13 NOTE — Op Note (Signed)
05/13/2014  3:42 PM  PATIENT:  Courtney Waters  58 y.o. female  PRE-OPERATIVE DIAGNOSIS:  Left ovarian cyst, 12cm on ultrasound, low back pain  POST-OPERATIVE DIAGNOSIS:  Same  PROCEDURE:  Procedure(s): LAPAROSCOPIC BILATERAL SALPINGO OOPHORECTOMY  SURGEON:  Lindey Renzulli SUZANNE  ASSISTANTS: Josefa Half   ANESTHESIA:   general  ESTIMATED BLOOD LOSS: 5cc  BLOOD ADMINISTERED:none   FLUIDS: 1500ccLR  UOP: 100cc clear  SPECIMEN:  Bilateral tubes and ovaries  DISPOSITION OF SPECIMEN:  PATHOLOGY  FINDINGS: large 12cm right ovarian cyst, mobile and smooth, normal right atrophic ovary, normal upper abdomen, normal pelvis once pathology was removed  DESCRIPTION OF OPERATION: Patient is taken to the operating room. She is placed in the supine position. She is a running IV in place. Informed consent was present on the chart. SCDs on her lower extremities and functioning properly. General endotracheal anesthesia was administered by the anesthesia staff without difficulty.  Legs are placed in the low lithotomy position in [place] before anesthesia was administered as well. . Her arms were tucked by her side.    Chlor prep was then used to prep the abdomen and Betadine was used to prep the inner thighs, perineum and vagina. Once 3 minutes had past the patient was draped in a normal standard fashion. The legs were lifted to the high lithotomy position. A bivalved speculum was placed in the vagina.  Anterior lip of the cervix was grasped with a single toothed tenaculum.  Os finder was used to dilate the cervix.  Then a Hulka clamp was advanced through the cervical os and attached to the anterior lip of the uterus.  The tenaculum was removed.  Foley was placed to straight drain. Clear urine was noted. Legs were lowered to the low lithotomy position and attention was turned the abdomen.   0.25% Marcaine was used anesthetize the skin in the the midclavicular line, two cm beneath the costal  margin on the left.  A 81mm non bladed trochar and port were passed through direct entry and without difficulty.  Low flow CO2 gas was attached the needle and the pneumoperitoneum was achieved without difficulty. Once 3.5 liters of gas was in the abdomen the pt was placed in Trendelenburg positioning to see the ovaries adequately.  The left ovary was large and but was mobile and smooth.  Decision was made to place to 16mm ports in the right and left quadrants.  Due to the size of the ovary, these were placed more lateral to the umbilicus than inferiorly.  Skin was transilluminated before skin was anesthetized with 0.25% Marcaine.  46mm skin incisions were made and 38mm non bladed trochar and ports were placed under direct visualization.  The upper abdomen was surveyed. Liver, gall bladder, and stomach edge appeared normal. T  Ureters were noted bilaterally. Attention was turned to the left side. With the ovary on stretch the left IP ligament was serially clamped, cauterized, and incised with the Gyrus. Once the IP was incised, the utero-ovarian pedicle was clamped, cauterized and incised.  Finally the mesosalpinx was clamped, cauterized and incised freeing the left ovary.    Attention was than turned the right side and in a similar fashion, the IP ligament was serially clamped, cauterized, and incised. The utero-ovarian pedicle and mesosalpinx were clamped, cauterized, and incised in a similar fashion freeing the right ovary and tube.   The 24mm port on the right side was removed and the skin incision was extended.  A 57mm endocatch bag  was passed into the pelvis.  The ovary was placed in the bag and the port removed.  The bag was brought to the surface.  The ovary was opened in the bag and the fluid was suctioned out with the suction irrigator.  No spill of fluid occurred.  Once the ovary was small enough, the ovary was delivered through the incision.  Then the right ovary was grasped and brought out as well.   Ovaries were labeled and sent to pathology.    Pelvis was irrigated. No bleeding was noted. Pedicles were hemostatic. Instruments were removed.  Pt was taken out of Trendelenburg positioning.  The 52mm ports were removed under direct visualization.  The pneumoperitoneum was relieved and the 15 mm port was removed.  This incision was closed at the fascial layer with a running, interlocking stitch of #0 Vicyrl.   Procedure was ended.   The skin was closed with a subcuticular stitch of #3.0 Vicryl. Dermabond was applied to each incision.   There was an erythematous rash that occurred around each dermabond site so 12.5mg  of IV benadryl was given.  This reversed the reaction.    The foley catheter and vaginal instruments were removed. Pt was back in supine position at this point. Sponge, lap, needle, and instrument counts were correct x 2. Pt tolerated procedure well and was taken to the recovery room in stable condition.   COUNTS:  YES  PLAN OF CARE: Transfer to PACU

## 2014-05-13 NOTE — H&P (Signed)
Courtney Waters is an 58 y.o. femaleG2P2 here for laparoscopic removal of left 12 cm ovarian mass.  This is simple in appearance and smooth and avascular.  Ca-125 is normal.  She has a few thin simple septations within the cyst.  Otherwise, it is completely benign in appearance.  Pt has decided to have both ovaries removed with this surgery.  Risks and benefits have been dicussed with pt and spouse yesterday when they were in my office.  She is here and ready to proceed.  Pertinent Gynecological History: Menses: none Bleeding: none Contraception: post menopausal status DES exposure: denies Blood transfusions: none Sexually transmitted diseases: no past history Previous GYN Procedures: none  Last mammogram: normal Date: 3/14 Last pap: normal Date: 2/13 OB History: G2, P2   Menstrual History: No LMP recorded. Patient is postmenopausal.    Past Medical History  Diagnosis Date  . Hearing loss in right ear   . MVP (mitral valve prolapse)     Hx  . Back pain 12/01/1998    compression fracture L1 from sledding accident  . SVD (spontaneous vaginal delivery)     x 2  . Depression   . Arthritis     hands and neck  . Fibromyalgia     Past Surgical History  Procedure Laterality Date  . Tonsillectomy  age 43  . Wisdom tooth extraction  age 67  . 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 Colonoscopy      Family History  Problem Relation Age of Onset  . Diabetes Mother   . Hearing loss Mother   . Cancer Father     GI  . Breast cancer Maternal Aunt   . Heart disease Sister   . Hearing loss Brother   . Ovarian cancer Other     MGM, mid 57's    Social History:  reports that she has never smoked. She has never used smokeless tobacco. She reports that she drinks about 1 - 1.5 ounces of alcohol per week. She reports that she does not use illicit drugs.  Allergies: No Known Allergies  Prescriptions  prior to admission  Medication Sig Dispense Refill  . Biotin 5000 MCG CAPS Take 1 capsule by mouth daily.      . Calcium Citrate-Vitamin D (CALCIUM CITRATE + D3 MAXIMUM) 315-250 MG-UNIT TABS Take 1 tablet by mouth daily.      . Cholecalciferol (VITAMIN D) 2000 UNITS CAPS Take 1 capsule by mouth daily.      . DULoxetine (CYMBALTA) 60 MG capsule Take 1 capsule by mouth daily.      Marland Kitchen estradiol-norethindrone (ACTIVELLA) 1-0.5 MG per tablet Take 0.5 tablets by mouth every other day. Takes on even days      . methocarbamol (ROBAXIN) 500 MG tablet Take 1 tablet by mouth 3 (three) times daily as needed for muscle spasms.       Marland Kitchen oxyCODONE-acetaminophen (PERCOCET/ROXICET) 5-325 MG per tablet Take 1-2 tablets by mouth every 4 (four) hours as needed for moderate pain.  30 tablet  0    Review of Systems  All other systems reviewed and are negative.   Blood pressure 159/84, pulse 71, temperature 98.1 F (36.7 C), temperature source Oral, resp. rate 18, SpO2 100.00%. Physical Exam  Constitutional: She is oriented to person, place, and time. She appears well-developed and well-nourished.  Cardiovascular: Normal rate and regular rhythm.   Respiratory: Effort  normal and breath sounds normal.  Neurological: She is alert and oriented to person, place, and time.  Skin: Skin is warm and dry.  Psychiatric: She has a normal mood and affect.    No results found for this or any previous visit (from the past 24 hour(s)).  No results found.  Assessment/Plan: 58 yo G2P2 with 12 cm left ovarian mass here for laparoscopic BSO.  All questions answered.  She is ready to proceed.  Courtney Waters SUZANNE 05/13/2014, 12:40 PM

## 2014-05-13 NOTE — Anesthesia Preprocedure Evaluation (Signed)
Anesthesia Evaluation  Patient identified by MRN, date of birth, ID band Patient awake    Reviewed: Allergy & Precautions, H&P , NPO status , Patient's Chart, lab work & pertinent test results  Airway Mallampati: II TM Distance: >3 FB Neck ROM: Full    Dental no notable dental hx. (+) Teeth Intact   Pulmonary neg pulmonary ROS,  breath sounds clear to auscultation  Pulmonary exam normal       Cardiovascular negative cardio ROS  + Valvular Problems/Murmurs MVP Rhythm:Regular Rate:Normal     Neuro/Psych PSYCHIATRIC DISORDERS Depression  Neuromuscular disease    GI/Hepatic negative GI ROS, Neg liver ROS,   Endo/Other  negative endocrine ROS  Renal/GU negative Renal ROS  negative genitourinary   Musculoskeletal  (+) Fibromyalgia -, narcotic dependent  Abdominal (+) - obese,   Peds  Hematology negative hematology ROS (+)   Anesthesia Other Findings   Reproductive/Obstetrics Left ovarian cyst                           Anesthesia Physical Anesthesia Plan  ASA: II  Anesthesia Plan: General   Post-op Pain Management:    Induction: Intravenous  Airway Management Planned: Oral ETT  Additional Equipment:   Intra-op Plan:   Post-operative Plan: Extubation in OR  Informed Consent: I have reviewed the patients History and Physical, chart, labs and discussed the procedure including the risks, benefits and alternatives for the proposed anesthesia with the patient or authorized representative who has indicated his/her understanding and acceptance.   Dental advisory given  Plan Discussed with: CRNA, Anesthesiologist and Surgeon  Anesthesia Plan Comments:         Anesthesia Quick Evaluation

## 2014-05-13 NOTE — Anesthesia Postprocedure Evaluation (Signed)
  Anesthesia Post-op Note  Patient: Courtney Waters  Procedure(s) Performed: Procedure(s): LAPAROSCOPIC BILATERAL SALPINGO OOPHORECTOMY (Bilateral)  Patient Location: PACU  Anesthesia Type:General  Level of Consciousness: awake, alert  and oriented  Airway and Oxygen Therapy: Patient Spontanous Breathing  Post-op Pain: mild  Post-op Assessment: Post-op Vital signs reviewed, Patient's Cardiovascular Status Stable, Respiratory Function Stable, Patent Airway, No signs of Nausea or vomiting and Pain level controlled  Post-op Vital Signs: Reviewed and stable  Last Vitals:  Filed Vitals:   05/13/14 1600  BP: 120/68  Pulse: 70  Temp:   Resp: 20    Complications: No apparent anesthesia complications

## 2014-05-13 NOTE — Transfer of Care (Signed)
Immediate Anesthesia Transfer of Care Note  Patient: Courtney Waters  Procedure(s) Performed: Procedure(s): LAPAROSCOPIC BILATERAL SALPINGO OOPHORECTOMY (Bilateral)  Patient Location: PACU  Anesthesia Type:General  Level of Consciousness: awake  Airway & Oxygen Therapy: Patient Spontanous Breathing  Post-op Assessment: Report given to PACU RN  Post vital signs: stable  Filed Vitals:   05/13/14 1138  BP: 159/84  Pulse: 71  Temp: 36.7 C  Resp: 18    Complications: No apparent anesthesia complications

## 2014-05-13 NOTE — Discharge Instructions (Signed)
Post-surgical Instructions, Outpatient Surgery  You may expect to feel dizzy, weak, and drowsy for as long as 24 hours after receiving the medicine that made you sleep (anesthetic). For the first 24 hours after your surgery:    Do not drive a car, ride a bicycle, participate in physical activities, or take public transportation until you are done taking narcotic pain medicines or as directed by Dr. Sabra Heck.   Do not drink alcohol or take tranquilizers.   Do not take medicine that has not been prescribed by your physicians.   Do not sign important papers or make important decisions while on narcotic pain medicines.   Have a responsible person with you.   CARE OF INCISION  If you have a bandage, you may remove it in one day.  If there are steri-strips or dermabond, just let this loosen on its own.   You may shower on the first day after your surgery.  Do not sit in a tub bath for one week.  Avoid heavy lifting (more than 10 pounds/4.5 kilograms), pushing, or pulling.   Avoid activities that may risk injury to your incisions.   PAIN MANAGEMENT  Motrin 800mg .  (This is the same as 4-200mg  over the counter tablets of Motrin or ibuprofen.)  You may take this every eight hours or as needed for mild pain.    Percocet 5/235.  For more severe pain, take one or two tablets every four to six hours as needed for pain control.  (Remember that narcotic pain medications increase your risk of constipation.  If this becomes a problem, you may take an over the counter stool softener like Colace 100mg  up to four times a day.)  DO'S AND DON'T'S  Do not take a tub bath for one week.  You may shower on the first day after your surgery  Do not do any heavy lifting for one to two weeks.  This increases the chance of bleeding.  Do move around as you feel able.  Stairs are fine.  You may begin to exercise again as you feel able.  Do not lift any weights for two weeks.  Do not put anything in the vagina for  two weeks--no tampons, intercourse, or douching.    REGULAR MEDIATIONS/VITAMINS:  You may restart all of your regular medications as prescribed.  You may restart all of your vitamins as you normally take them.    PLEASE CALL OR SEEK MEDICAL CARE IF:  You have persistent nausea and vomiting.   You have trouble eating or drinking.   You have an oral temperature above 100.5.   You have constipation that is not helped by adjusting diet or increasing fluid intake. Pain medicines are a common cause of constipation.   You have heavy vaginal bleeding  You have redness or drainage from your incision(s) or there is increasing pain or tenderness near or in the surgical site.

## 2014-05-14 ENCOUNTER — Encounter (HOSPITAL_COMMUNITY): Payer: Self-pay | Admitting: Obstetrics & Gynecology

## 2014-05-16 ENCOUNTER — Encounter: Payer: Self-pay | Admitting: Obstetrics & Gynecology

## 2014-06-03 ENCOUNTER — Encounter: Payer: Self-pay | Admitting: Obstetrics & Gynecology

## 2014-06-03 ENCOUNTER — Ambulatory Visit (INDEPENDENT_AMBULATORY_CARE_PROVIDER_SITE_OTHER): Payer: BC Managed Care – PPO | Admitting: Obstetrics & Gynecology

## 2014-06-03 VITALS — BP 106/60 | HR 64 | Resp 16 | Ht 67.25 in | Wt 146.2 lb

## 2014-06-03 DIAGNOSIS — N83209 Unspecified ovarian cyst, unspecified side: Secondary | ICD-10-CM

## 2014-06-03 NOTE — Progress Notes (Signed)
Post Operative Visit  Procedure:Laparoscopic BSO Days Post-op:  22 days  Subjective: Doing well from surgery.  Reports she has had more hot flashes since having the surgery.  This was a surprise to her.  Bowel movements were harder and more constipation when on the pain medication.  Still using some for her back but not having any "post operative" pain.  She is back on her "normal" one Percocet in the morning.  Now she reports her bowel movements are more "paste like" in consistency.  Never had diarrhea.  No fevers.    Reviewed pathology and pictures with pt.  Serous cystadenoma present without any evidence of malignant changes.    Objective: BP 106/60  Pulse 64  Resp 16  Ht 5' 7.25" (1.708 m)  Wt 146 lb 3.2 oz (66.316 kg)  BMI 22.73 kg/m2  EXAM General: alert and cooperative GI: soft, non-tender; bowel sounds normal; no masses,  no organomegaly and incision: clean, dry and intact Extremities: extremities normal, atraumatic, no cyanosis or edema Vaginal Bleeding: none  Assessment: s/p laparoscopic BSO  Plan: Pt will try Align and give me an update in 2 weeks O/W AEX scheduled for 2/16

## 2014-06-16 ENCOUNTER — Telehealth: Payer: Self-pay | Admitting: Obstetrics & Gynecology

## 2014-06-16 DIAGNOSIS — R197 Diarrhea, unspecified: Secondary | ICD-10-CM

## 2014-06-16 NOTE — Telephone Encounter (Signed)
Spoke with patient. Patient states that she started taking the align on 7/28. Took it the entire week of 7/28 minus one day. Patient states that her stomach started "gurgling and popping." States that her bowel movements used to be sticky and pastey but are now diarrhea. Patient has not had a formed bowel movement in two weeks. Patient has had a decrease in appetite as well with low energy. Had Giardia 25 years ago and was hospitalized. Patient's husband is worried this might be what is causing this. Has been nauseated Thursday-Saturday of last week but has been "a little better" since Sunday. "I can feel my incision places a little more." Patient denies fevers or vomiting. Patient stopped taking the align 4 days ago and states she feels "a little better but still not good." Patient has not had a formed bowel movement since stopping medication. Advised would send a message over to [place] with update and give patient a call back with further instructions and recommendations. Patient agreeable.

## 2014-06-16 NOTE — Telephone Encounter (Signed)
Spoke with patient. Advised patient that I spoke with Dr.Miller and she recommends that patient drop off stool sample at office for testing. Advised patient will leave two specimen cups at the front with a biohazard bag. Advised patient to have someone pick up for stool collection. Advised to return to office with stool in the biohazard bag and let the front know she is dropping off a stool sample. Patient is agreeable and verbalizes understanding. Bag and cups left at front with patient's name and DOB.  Routing to Barnett Elzey for final review. Patient agreeable to disposition. Will close encounter

## 2014-06-16 NOTE — Telephone Encounter (Signed)
Attempted to reach patient at number provided [phone]. Phone rang and rang with no answer or voicemail. Will try to reach patient at other number listed. Left message at other number provided [phone] to return call to office and ask for [facility].

## 2014-06-16 NOTE — Telephone Encounter (Signed)
Have patient come and get sterile cup/cups to give stool sample.  I will test for Giardia and several other things.  Orders placed.

## 2014-06-16 NOTE — Telephone Encounter (Signed)
Two weeks after taking Align, patient is calling to report she is not having regular stools but "mostly liquid." Patient thinks the Align may be the cause and she has stopped taking it. Patient states she was told to call in with a report.

## 2014-06-17 ENCOUNTER — Other Ambulatory Visit: Payer: Self-pay | Admitting: Obstetrics & Gynecology

## 2014-06-17 ENCOUNTER — Other Ambulatory Visit: Payer: BC Managed Care – PPO

## 2014-06-17 DIAGNOSIS — R197 Diarrhea, unspecified: Secondary | ICD-10-CM

## 2014-06-18 ENCOUNTER — Ambulatory Visit: Payer: BC Managed Care – PPO

## 2014-06-18 ENCOUNTER — Other Ambulatory Visit: Payer: Self-pay | Admitting: Obstetrics & Gynecology

## 2014-06-18 ENCOUNTER — Other Ambulatory Visit: Payer: BC Managed Care – PPO

## 2014-06-19 LAB — GIARDIA/CRYPTOSPORIDIUM (EIA)
CRYPTOSPORIDIUM SCREEN (EIA) (SOL): NEGATIVE
Giardia Screen (EIA): NEGATIVE

## 2014-06-19 LAB — C. DIFFICILE GDH AND TOXIN A/B
C. difficile GDH: NOT DETECTED
C. difficile Toxin A/B: NOT DETECTED

## 2014-06-19 LAB — FECAL LACTOFERRIN, QUANT: Lactoferrin: NEGATIVE

## 2014-06-20 ENCOUNTER — Telehealth: Payer: Self-pay | Admitting: Obstetrics & Gynecology

## 2014-06-20 NOTE — Telephone Encounter (Signed)
Spoke with patient. Patient states that she received results on mychart and would like to know if that is all she is expecting. Advised that it looks like they have all come back and are negative. Patient states that she still has no appetite. Patient is trying to stay hydrate with drinking a lot of water but states that she has no interest in eating. "I haven't felt right." Patient's bowel movements are still diarrhea. States that she has lost 8 pounds. "I am uncomfortable when I go to the restroom."  Patient denies nausea, fever or vomiting. Patient is overdue for colonoscopy. Advised patient would send a message to Dr.Miller and give her a call back with further instructions and recommendations. Patient agreeable and would like a call back before the end of today.

## 2014-06-20 NOTE — Telephone Encounter (Signed)
Restart probiotic.  All tests negative except culture "preliminarily negative".  Will get final read next week.  She should plan to see her GI.  Who is that so all records can be faxed.

## 2014-06-20 NOTE — Telephone Encounter (Signed)
Spoke with patient. Advised of message from [place]. Patient is agreeable and will begin to take align again. Patient states that she is hesitant because that is what started her stomach gurgling but she will do it. Patient also states that she was going to go see Dr.Mann again but her previous insurance was not going to cover her colonoscopy so they put it off. Patient will check with new coverage and see what will be covered and get scheduled. Advised patient to call back with scheduling information so that we can fax over results from cultures to the office for her visit. Patient is agreeable.  Routing to provider for final review. Patient agreeable to disposition. Will close encounter

## 2014-06-20 NOTE — Telephone Encounter (Signed)
Patient is calling about her results from her stool sample she brought in the other day said she got some results from [place] and had some questions and wanted to know if all the results were back. Also wanted to give Courtney Waters and update on what was going on with her

## 2014-06-20 NOTE — Telephone Encounter (Signed)
Left message to call Kaitlyn at [phone]. 

## 2014-06-22 LAB — STOOL CULTURE

## 2014-06-27 ENCOUNTER — Telehealth: Payer: Self-pay | Admitting: Obstetrics & Gynecology

## 2014-06-27 NOTE — Telephone Encounter (Signed)
I recommended over a week ago that she follow up with GI due to the continued loose stool but all tests being negative.  I agree with recommendations made.

## 2014-06-27 NOTE — Telephone Encounter (Signed)
Spoke with patient. Patient states that she has been taking the Align for one week and is still have diarrhea and lack of appetite. States that diarrhea is mainly in the morning. Patient would like to know if she should continue taking the Align and if she is able to take immodium if she needs it. Advised patient to continue taking the Align and that she is able to take immodium if she would like as well. Advised patient most important thing is to be seen with GI. Advised they will be able to really give her answers and get to the bottom of what is going on. Patient is agreeable and states that she will call on Monday to schedule an appointment. Advised patient if she needs any help scheduling to give our office a call. Advised once appointment is made to call so that we can send over the results from the stool cultures we did. Patient is agreeable and verbalizes understanding.  Routing to provider for final review. Patient agreeable to disposition. Will close encounter

## 2014-06-27 NOTE — Telephone Encounter (Signed)
Returning a call Courtney Waters

## 2014-06-27 NOTE — Telephone Encounter (Signed)
Please call Dr. Lorie Apley office and make appt for patient since she hasn't done it yet.

## 2014-06-30 NOTE — Telephone Encounter (Signed)
Spoke with patient. Patient is scheduled to see Dr.Mann tomorrow at 11:15am and plans to schedule colonoscopy after consult. Advised would let Dr.Miller know. Patient agreeable.  Routing to provider for final review. Patient agreeable to disposition. Will close encounter.

## 2014-07-29 ENCOUNTER — Other Ambulatory Visit: Payer: BC Managed Care – PPO | Admitting: Obstetrics & Gynecology

## 2014-07-29 ENCOUNTER — Telehealth: Payer: Self-pay | Admitting: Obstetrics & Gynecology

## 2014-07-29 DIAGNOSIS — N950 Postmenopausal bleeding: Secondary | ICD-10-CM

## 2014-07-29 NOTE — Telephone Encounter (Signed)
Spoke with patient. She has had one episode this morning of noticing blood in toilet after urinating only. Patient also noticed one darker piece of blood in toilet (clot?) as well. Did not notice with wiping.  Patient post menopausal, on Activella 1/2 tablet daily, has not missed any doses.  Patient is unsure if blood came from vagina or rectum.  S/P Laparoscopic bilateral salpingo oophorectomy with Dr. Sabra Heck on 05/13/14, patient states she did well post op other than diarrhea. Patient has not been taking align as she felt it caused gas and appetite suppression. Patient feels she has not had a good bowel movement for a long time, advised she should discuss this today with Dr. Collene Mares.  Patient has appointment for follow up from colonsocopy today with Dr. Collene Mares at 1330. Patient reports small ulcer was found on coloncopy. Patient denies any fevers and abdominal or flank pain. Patient reports chronic back pain.  Advised patient to keep appointment today with Dr. Collene Mares.  Advised will need evaluation by Dr. Sabra Heck for potential post menopausal bleeding. Scheduled office visit for 07/30/14 at 1045 and placed order for endometrial bx for precert.  Advised patient to call back or seek immediate medical care if bleeding worsens or soaking through 1 pad/tampon per hour for two hours or if becomes symptomatic with sob, chest pain, fatigued, lightheaded, or weakness.  Patient verbalized understanding.    Advised would discuss with Dr. Sabra Heck and would return her call with any further instructions from Dr. Sabra Heck.Gabriel Cirri can you hold  endometrial biopsy  insurance pre certification for appointment and does not need to be collected.

## 2014-07-29 NOTE — Telephone Encounter (Signed)
Orders from Dr. Sabra Heck for Pelvic ultrasound first to check endometrial lining.  Order placed for Pelvic ultrasound. Spoke with patient. She is agreeable to this plan. Cancelled appointment for 07/30/14 and scheduled Pelvic ultrasound with Dr. Sabra Heck for 07/31/14 at 1230.  She is again agreeable to instructions and will call back with any worsening in symptoms.   Routing to provider for final review. Patient agreeable to disposition. Will close encounter  Gabriel Cirri can you check insurance pre certification for Pelvic ultrasound, link order after precert?

## 2014-07-29 NOTE — Telephone Encounter (Signed)
PR: $15

## 2014-07-29 NOTE — Telephone Encounter (Signed)
Pt says she got up to go to the bathroom and there was a lot of blood when she wiped and in the toliet. Pt says she doesn't think that she is still bleeding.

## 2014-07-29 NOTE — Telephone Encounter (Signed)
Thanks for information.  Encounter closed.

## 2014-07-30 ENCOUNTER — Ambulatory Visit: Payer: BC Managed Care – PPO | Admitting: Obstetrics & Gynecology

## 2014-07-31 ENCOUNTER — Ambulatory Visit (INDEPENDENT_AMBULATORY_CARE_PROVIDER_SITE_OTHER): Payer: BC Managed Care – PPO

## 2014-07-31 ENCOUNTER — Ambulatory Visit (INDEPENDENT_AMBULATORY_CARE_PROVIDER_SITE_OTHER): Payer: BC Managed Care – PPO | Admitting: Obstetrics & Gynecology

## 2014-07-31 ENCOUNTER — Telehealth: Payer: Self-pay | Admitting: Emergency Medicine

## 2014-07-31 ENCOUNTER — Other Ambulatory Visit: Payer: BC Managed Care – PPO

## 2014-07-31 VITALS — BP 122/82 | Ht 67.25 in | Wt 135.0 lb

## 2014-07-31 DIAGNOSIS — R319 Hematuria, unspecified: Secondary | ICD-10-CM

## 2014-07-31 DIAGNOSIS — N950 Postmenopausal bleeding: Secondary | ICD-10-CM

## 2014-07-31 LAB — POCT URINALYSIS DIPSTICK
Bilirubin, UA: NEGATIVE
Glucose, UA: NEGATIVE
Ketones, UA: NEGATIVE
Nitrite, UA: NEGATIVE
Protein, UA: NEGATIVE
UROBILINOGEN UA: NEGATIVE
pH, UA: 5

## 2014-07-31 NOTE — Telephone Encounter (Signed)
Patient is scheduled for Dr. Janice Norrie tomorrow, 08/01/14 at 1245 at [place].  Patient not available at this time, message given to husband, Rob, okay per designated party release form. Records faxed to Referral Coordinator at [facility].  Advised to call our office with any concerns as needed.   cc Felipa Emory, records sent and appointment made.

## 2014-07-31 NOTE — Progress Notes (Signed)
58 y.o. Marriedfemale here for a pelvic ultrasound due to questionable PMP bleeding.  Pt reports with episode of urination, she saw blood in the toilet.  She wasn't sure if this was rectal, vaginal, or urinary.  She has recently had a colonoscopy.  Also, pt had both ovaries and tubes removed this year.  I recommended she be seen for ultrasound to assess endometrium.  I was doubtful this was vaginal.  Denies any cramping or pain.    No LMP recorded. Patient is postmenopausal.  FINDINGS: UTERUS: 6.6 x 3.8 x 2.6cm with no fibroids or masses EMS: symmetric and 3.13mm ADNEXA:   Left ovary surgically absent   Right ovary surgically absent CUL DE SAC: no free fluid  Pelvic exam then performed.  NAEFG.  Vaginal normal.  No evidence of blood.  Cervix normal.  Uterus normal and non tender.   Endometrial biopsy not recommended.  Urine with gross hematuria in it and dip 4+ RBCs.  Feel pt needs urologic evaluation.  Will refer.  Assessment:  Gross hematuria Normal ultrasound with thin endometrium and no evidence of vaginal bleeding on physical exam.  Plan: Refer to urology Urine culture pending.  ~15 minutes spent with patient >50% of time was in face to face discussion of above.

## 2014-08-01 LAB — URINE CULTURE: Colony Count: 30000

## 2014-08-01 NOTE — Telephone Encounter (Signed)
Thank you for the information.  Encounter closed. 

## 2014-08-03 ENCOUNTER — Encounter: Payer: Self-pay | Admitting: Obstetrics & Gynecology

## 2014-08-06 ENCOUNTER — Telehealth: Payer: Self-pay | Admitting: Obstetrics & Gynecology

## 2014-08-06 DIAGNOSIS — N950 Postmenopausal bleeding: Secondary | ICD-10-CM

## 2014-08-06 NOTE — Telephone Encounter (Signed)
Pt says that she had thought she had blood in her urine and was seen by dr Sabra Heck and referred to a urologist. She saw them on Friday and every thing came back okay. Pt states this morning she had a lot of blood when she went to the bathroom. It seems to be only when she goes to the bathroom. She doesn't think it is coming from her urine anymore but from her uterus.

## 2014-08-06 NOTE — Telephone Encounter (Signed)
Spoke with patient at time of incoming call. Patient states that she was seen last Thursday for ultrasound with Dr.Miller and everything came back great. Was seen with Dr.Nessi on Friday and had Ct scan which was negative. Today patient went to the restroom and urine was clear but when she placed tissue up to her vagina states "There was a lot of blood. I do not think it is from my urine. I think it is from my vagina." Patient is not having to wear a pad at this time. "I am worried because it is more than before." Advised patient will speak with Dr.Silva who is covering for Dr.Miller and return call regarding scheduling and further instructions. Patient is agreeable and verbalizes understanding. Requests call to husband if she does not answer. ROI on file to speak with husband.

## 2014-08-06 NOTE — Telephone Encounter (Signed)
Spoke with patient. Advised spoke with Dr.Silva. Will need to see patient for evaluation tomorrow at 1pm for EMB. Patient is agreeable and verbalizes understanding. Advised to take 800mg  of ibuprofen/motrin 1 hours before appointment and to eat a good lunch and drink plenty of fluids. Patient is agreeable. Patient states that she also had surgery on July 7th with Dr.Miller and has one incision on her right side and two on her left. States that bottom left incision has been causing her some discomfort. "Could it be from the staples she had to use and something came undone?" Advised patient will need to let Dr.Silva look at this area tomorrow as well. Patient is agreeable. Will call back if bleeding increases, discomfort increases, or anything changes.   Routing to Dr.Silva as covering Cc: Dr.Miller Cc: Felipa Emory for precert of EMB  Routing to provider for final review. Patient agreeable to disposition. Will close encounter

## 2014-08-07 ENCOUNTER — Ambulatory Visit (INDEPENDENT_AMBULATORY_CARE_PROVIDER_SITE_OTHER): Payer: BC Managed Care – PPO | Admitting: Obstetrics and Gynecology

## 2014-08-07 ENCOUNTER — Encounter: Payer: Self-pay | Admitting: Obstetrics and Gynecology

## 2014-08-07 VITALS — BP 110/60 | HR 80 | Ht 67.25 in | Wt 137.4 lb

## 2014-08-07 DIAGNOSIS — N950 Postmenopausal bleeding: Secondary | ICD-10-CM | POA: Insufficient documentation

## 2014-08-07 DIAGNOSIS — Z780 Asymptomatic menopausal state: Secondary | ICD-10-CM | POA: Insufficient documentation

## 2014-08-07 NOTE — Patient Instructions (Signed)
Endometrial Biopsy, Care After Refer to this sheet in the next few weeks. These instructions provide you with information on caring for yourself after your procedure. Your health care provider may also give you more specific instructions. Your treatment has been planned according to current medical practices, but problems sometimes occur. Call your health care provider if you have any problems or questions after your procedure. WHAT TO EXPECT AFTER THE PROCEDURE After your procedure, it is typical to have the following:  You may have mild cramping and a small amount of vaginal bleeding for a few days after the procedure. This is normal. HOME CARE INSTRUCTIONS  Only take over-the-counter or prescription medicine as directed by your health care provider.  Do not douche, use tampons, or have sexual intercourse until your health care provider approves.  Follow your health care provider's instructions regarding any activity restrictions, such as strenuous exercise or heavy lifting. SEEK MEDICAL CARE IF:  You have heavy bleeding or bleeding longer than 2 days after the procedure.  You have bad smelling drainage from your vagina.  You have a fever and chills.  Youhave severe lower stomach (abdominal) pain. SEEK IMMEDIATE MEDICAL CARE IF:  You have severe cramps in your stomach or back.  You pass large blood clots.  Your bleeding increases.  You become weak or lightheaded, or you pass out. Document Released: 08/14/2013 Document Reviewed: 08/14/2013 ExitCare Patient Information 2015 [employer]. This information is not intended to replace advice given to you by your health care provider. Make sure you discuss any questions you have with your health care provider.  

## 2014-08-07 NOTE — Progress Notes (Signed)
Patient ID: Courtney Waters, female   DOB: 1956-06-04, 58 y.o.   MRN: [id number] GYNECOLOGY VISIT  PCP:   Referring provider:   HPI: 58 y.o.   Married  Caucasian  female   G2P2002 with Patient's last menstrual period was 11/07/2009.   here for  Postmenopausal bleeding.  Dr. Collene Mares did colonoscopy end of August and had follow up last Tuesday and had blood in the urine that same day.  Bleeding was not rectal at that visit.   Dr. Sabra Heck saw patient one week ago and had a normal pelvic ultrasound.  Uterus - 6.6 x 3.8 x 2.6cm with no fibroids or masses. Absent ovaries.  Was noted to have gross blood on urine specimen at that time.  Referred to Dr. Kellie Simmering at [facility].  This week had a CT scan which was normal per reports by phone to patient's husband. Particulate matter noted in ?bladder but otherwise was normal.  No cystoscopy performed.  Has follow up with him next week.   Yesterday had blood on tissue paper and patient believes it is uterine in source.  On Activella 1/2 tablet every other day.  Has been doing for over one year.  None today.   No vaginal treatments of any kind.  No recent intercourse.  None in over 1 month.   Has some left lower quadrant discomfort following laparoscopic BSO.  Also has chronic back pain.   GYNECOLOGIC HISTORY: Patient's last menstrual period was 11/07/2009. Sexually active:  yes Partner preference: female Contraception:   postmenopausal Menopausal hormone therapy: Activella1/0.5mg --takes 1/2 tab qod. DES exposure:  no  Blood transfusions:  no  Sexually transmitted diseases:  no  GYN procedures and prior surgeries:  Laparoscopic BSO 05-13-14, Last mammogram: 01-21-14 dense/normal: [facility]               Last pap and high risk HPV testing:  12-13-11 wnl:neg HR HPV  History of abnormal pap smear: no    OB History   Grav Para Term Preterm Abortions TAB SAB Ect Mult Living   2 2 2       2        LIFESTYLE: Exercise:    swim            Tobacco:   no Alcohol:     Occ.  Drug use:   no  Patient Active Problem List   Diagnosis Date Noted  . METATARSALGIA 08/14/2007  . BUNIONS, BILATERAL 08/14/2007  . UNEQUAL LEG LENGTH, ACQUIRED 08/14/2007  . HEEL PAIN, LEFT 08/08/2007  . OSTEOPENIA 08/08/2007    Past Medical History  Diagnosis Date  . Hearing loss in right ear   . MVP (mitral valve prolapse)     Hx  . Back pain 12/01/1998    compression fracture L1 from sledding accident  . SVD (spontaneous vaginal delivery)     x 2  . Depression   . Arthritis     hands and neck  . Fibromyalgia     Past Surgical History  Procedure Laterality Date  . Tonsillectomy  age 67  . Wisdom tooth extraction  age 51  . 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 Colonoscopy    . Laparoscopic bilateral salpingo oopherectomy Bilateral 05/13/2014    Procedure: LAPAROSCOPIC BILATERAL SALPINGO OOPHORECTOMY;  Surgeon: Lyman Speller, MD;  Location: [facility];  Service: Gynecology;  Laterality: Bilateral;  Current Outpatient Prescriptions  Medication Sig Dispense Refill  . Biotin 5000 MCG CAPS Take 1 capsule by mouth daily.      . Calcium Citrate-Vitamin D (CALCIUM CITRATE + D3 MAXIMUM) 315-250 MG-UNIT TABS Take 1 tablet by mouth daily.      . Cholecalciferol (VITAMIN D) 2000 UNITS CAPS Take 1 capsule by mouth daily.      Marland Kitchen dexlansoprazole (DEXILANT) 60 MG capsule Take 60 mg by mouth daily.      . DULoxetine (CYMBALTA) 60 MG capsule Take 1 capsule by mouth daily.      Marland Kitchen estradiol-norethindrone (ACTIVELLA) 1-0.5 MG per tablet Take 0.5 tablets by mouth every other day. Takes on even days      . methocarbamol (ROBAXIN) 500 MG tablet Take 1 tablet by mouth 3 (three) times daily as needed for muscle spasms.       Marland Kitchen oxyCODONE-acetaminophen (PERCOCET/ROXICET) 5-325 MG per tablet Take 1-2 tablets by mouth every 4 (four) hours as needed for moderate pain.  30  tablet  0   No current facility-administered medications for this visit.     ALLERGIES: Review of patient's allergies indicates no known allergies.  Family History  Problem Relation Age of Onset  . Diabetes Mother   . Hearing loss Mother   . Cancer Father     GI  . Breast cancer Maternal Aunt   . Heart disease Sister   . Hearing loss Brother   . Ovarian cancer Other     MGM, mid 99's    History   Social History  . Marital Status: Married    Spouse Name: N/A    Number of Children: 2  . Years of Education: N/A   Occupational History  . Not on file.   Social History Main Topics  . Smoking status: Never Smoker   . Smokeless tobacco: Never Used  . Alcohol Use: 1.0 - 1.5 oz/week    2-3 drink(s) per week     Comment: socially  . Drug Use: No  . Sexual Activity: Yes    Partners: Male    Birth Control/ Protection: Post-menopausal   Other Topics Concern  . Not on file   Social History Narrative  . No narrative on file    ROS:  Pertinent items are noted in HPI.  PHYSICAL EXAMINATION:    BP 110/60  Pulse 80  Ht 5' 7.25" (1.708 m)  Wt 137 lb 6.4 oz (62.324 kg)  BMI 21.36 kg/m2  LMP 11/07/2009   Wt Readings from Last 3 Encounters:  08/07/14 137 lb 6.4 oz (62.324 kg)  07/31/14 135 lb (61.236 kg)  06/03/14 146 lb 3.2 oz (66.316 kg)     Ht Readings from Last 3 Encounters:  08/07/14 5' 7.25" (1.708 m)  07/31/14 5' 7.25" (1.708 m)  06/03/14 5' 7.25" (1.708 m)    General appearance: alert, cooperative and appears stated age  Pelvic: External genitalia:  no lesions              Urethra:  normal appearing urethra with no masses, tenderness or lesions              Bartholins and Skenes: normal                 Vagina: normal appearing vagina with normal color and discharge, no lesions              Cervix: normal appearance  Bimanual Exam:  Uterus:  uterus is normal size, shape, consistency and nontender                                      Adnexa:  normal adnexa in size, nontender and no masses                               Procedure - endometrial biopsy Consent performed. Speculum place in vagina.  Sterile prep of cervix with Hibiclens.  Paracervical block with 10 cc 1% lidocaine - lot number [id number], Expiration 04/08/15. Pipelle placed to    6   cm without difficulty twice. Tissue obtained and sent to pathology. Speculum removed.  No complications.  ASSESSMENT  Postmenopausal bleeding versus urologic source. Patient taking Activella 1/2 tab every other day.   PLAN  Follow up endometrial biopsy.  Return to Dr. Kellie Simmering to complete evaluation and cystoscopy.  Discussed potential discontinuation of HRT regimen to remove all possible etiologies for the bleeding episodes.  An After Visit Summary was printed and given to the patient.  15 minutes face to face time of which over 50% was spent in counseling.

## 2014-08-11 ENCOUNTER — Telehealth: Payer: Self-pay | Admitting: Obstetrics and Gynecology

## 2014-08-11 NOTE — Telephone Encounter (Signed)
Phone call to patient regarding endometrial biopsy result - benign endometrium.   Discussion regarding Activella HRT.  Has been on 1/2 tablet every other day.   I recommended that patient take either 1/2 tablet every day or stop all together.  Patient will plan to take Activella 1/2 tab daily for the next 3 months and then stop.  Has AEX in February.   Will see urology in follow up for cystoscopy.

## 2014-09-08 ENCOUNTER — Encounter: Payer: Self-pay | Admitting: Obstetrics and Gynecology

## 2014-10-16 HISTORY — PX: BUNIONECTOMY: SHX129

## 2014-12-23 ENCOUNTER — Ambulatory Visit: Payer: BC Managed Care – PPO | Admitting: Nurse Practitioner

## 2014-12-30 ENCOUNTER — Ambulatory Visit: Payer: BC Managed Care – PPO | Admitting: Obstetrics & Gynecology

## 2015-01-19 ENCOUNTER — Ambulatory Visit (INDEPENDENT_AMBULATORY_CARE_PROVIDER_SITE_OTHER): Payer: BLUE CROSS/BLUE SHIELD | Admitting: Obstetrics & Gynecology

## 2015-01-19 ENCOUNTER — Encounter: Payer: Self-pay | Admitting: Obstetrics & Gynecology

## 2015-01-19 VITALS — BP 134/60 | HR 68 | Resp 16 | Ht 67.25 in | Wt 134.4 lb

## 2015-01-19 DIAGNOSIS — N950 Postmenopausal bleeding: Secondary | ICD-10-CM

## 2015-01-19 DIAGNOSIS — Z0000 Encounter for general adult medical examination without abnormal findings: Secondary | ICD-10-CM

## 2015-01-19 DIAGNOSIS — Z01419 Encounter for gynecological examination (general) (routine) without abnormal findings: Secondary | ICD-10-CM

## 2015-01-19 DIAGNOSIS — Z124 Encounter for screening for malignant neoplasm of cervix: Secondary | ICD-10-CM

## 2015-01-19 LAB — POCT URINALYSIS DIPSTICK
Bilirubin, UA: NEGATIVE
Blood, UA: NEGATIVE
GLUCOSE UA: NEGATIVE
Ketones, UA: NEGATIVE
NITRITE UA: NEGATIVE
Protein, UA: NEGATIVE
UROBILINOGEN UA: NEGATIVE
pH, UA: 5

## 2015-01-19 MED ORDER — ESTRADIOL-NORETHINDRONE ACET 1-0.5 MG PO TABS: 1.0000 | ORAL_TABLET | Freq: Every day | ORAL | Status: DC

## 2015-01-19 NOTE — Progress Notes (Signed)
59 y.o. Courtney Waters MarriedCaucasianF here for annual exam.  Pt reports she is still spotting some.  This is only with wiping.  Denies cramping.  Is using her Activella every other day now.  Reports bleeding is minimal but still these.  Also saw Dr. Janice Norrie and cystoscopy was negative.    Reports also had bunionectomy done with Dr. Durward Fortes in December.  PCP:  Dr. Sharlett Iles.  Hasn't seen him in several years.  Patient's last menstrual period was 11/07/2009.          Sexually active: Yes.    The current method of family planning is post menopausal status.    Exercising: Yes.    water aerobics Smoker:  no  Health Maintenance: Pap:  12/13/11 WNL/negative HR HPV  History of abnormal Pap:  no MMG:  01/21/14 3D-normal Colonoscopy:  8/15-Dr Collene Mares BMD:   3/14, 1.0, -0.6 TDaP:  01/24/07  Screening Labs: today, Hb today: Recent surgery, Urine today: WBC-trace   reports that she has never smoked. She has never used smokeless tobacco. She reports that she drinks about 0.6 - 1.2 oz of alcohol per week. She reports that she does not use illicit drugs.  Past Medical History  Diagnosis Date  . Hearing loss in right ear   . MVP (mitral valve prolapse)     Hx  . Back pain 12/01/1998    compression fracture L1 from sledding accident  . SVD (spontaneous vaginal delivery)     x 2  . Depression   . Arthritis     hands and neck  . Fibromyalgia     Past Surgical History  Procedure Laterality Date  . Tonsillectomy  age 66  . Wisdom tooth extraction  age 1  . 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 Colonoscopy    . Laparoscopic bilateral salpingo oopherectomy Bilateral 05/13/2014    Procedure: LAPAROSCOPIC BILATERAL SALPINGO OOPHORECTOMY;  Surgeon: Lyman Speller, MD;  Location: [facility];  Service: Gynecology;  Laterality: Bilateral;  . Bunionectomy  10/16/14    left foot    Current Outpatient Prescriptions   Medication Sig Dispense Refill  . Biotin 5000 MCG CAPS Take 1 capsule by mouth daily.    Marland Kitchen CALCIUM PO Take by mouth. With magnesium and zinc    . Cholecalciferol (VITAMIN D) 2000 UNITS CAPS Take 1 capsule by mouth daily.    Marland Kitchen dexlansoprazole (DEXILANT) 60 MG capsule Take 60 mg by mouth daily.    . DULoxetine (CYMBALTA) 60 MG capsule Take 1 capsule by mouth daily.    Marland Kitchen estradiol-norethindrone (ACTIVELLA) 1-0.5 MG per tablet Take 0.5 tablets by mouth every other day. Takes on even days    . methocarbamol (ROBAXIN) 500 MG tablet Take 1 tablet by mouth 3 (three) times daily as needed for muscle spasms.     Marland Kitchen oxyCODONE-acetaminophen (PERCOCET/ROXICET) 5-325 MG per tablet Take 1-2 tablets by mouth every 4 (four) hours as needed for moderate pain. 30 tablet 0   No current facility-administered medications for this visit.    Family History  Problem Relation Age of Onset  . Diabetes Mother   . Hearing loss Mother   . Cancer Father     GI  . Breast cancer Maternal Aunt   . Heart disease Sister   . Hearing loss Brother   . Ovarian cancer Other     MGM, mid 60's    ROS:  Pertinent items are noted in HPI.  Otherwise, a comprehensive ROS was negative.  Exam:   BP 134/60 mmHg  Pulse 68  Resp 16  Ht 5' 7.25" (1.708 m)  Wt 134 lb 6.4 oz (60.963 kg)  BMI 20.90 kg/m2  LMP 11/07/2009   Height: 5' 7.25" (170.8 cm)  Ht Readings from Last 3 Encounters:  01/19/15 5' 7.25" (1.708 m)  08/07/14 5' 7.25" (1.708 m)  07/31/14 5' 7.25" (1.708 m)    General appearance: alert, cooperative and appears stated age Head: Normocephalic, without obvious abnormality, atraumatic Neck: no adenopathy, supple, symmetrical, trachea midline and thyroid normal to inspection and palpation Lungs: clear to auscultation bilaterally Breasts: normal appearance, no masses or tenderness Heart: regular rate and rhythm Abdomen: soft, non-tender; bowel sounds normal; no masses,  no organomegaly Extremities: extremities  normal, atraumatic, no cyanosis or edema Skin: Skin color, texture, turgor normal. No rashes or lesions Lymph nodes: Cervical, supraclavicular, and axillary nodes normal. No abnormal inguinal nodes palpated Neurologic: Grossly normal   Pelvic: External genitalia:  no lesions              Urethra:  normal appearing urethra with no masses, tenderness or lesions              Bartholins and Skenes: normal                 Vagina: normal appearing vagina with normal color and discharge, no lesions              Cervix: no lesions              Pap taken: Yes.   Bimanual Exam:  Uterus:  normal size, contour, position, consistency, mobility, non-tender              Adnexa: no mass, fullness, tenderness               Rectovaginal: Confirms               Anus:  normal sphincter tone, no lesions  Chaperone was present for exam.  A:  Well Woman with normal exam Postmenopausal on HRT Tapering dose of HRT  History of fibromyalgia and chronic back pain Vit D deficiency  H/O BSO 7/15  P: Pap smear today.  Neg Hr HPV 2/13 Mammogram due 01/2014 Refill on HRT for a year.  She will continue to taper off. CMP, TSH, Vit D, Lipids (not fasting)  Return for [facility] due to continued PMP bleeding

## 2015-01-20 LAB — COMPREHENSIVE METABOLIC PANEL
ALK PHOS: 54 U/L (ref 39–117)
ALT: 11 U/L (ref 0–35)
AST: 15 U/L (ref 0–37)
Albumin: 4.4 g/dL (ref 3.5–5.2)
BILIRUBIN TOTAL: 0.4 mg/dL (ref 0.2–1.2)
BUN: 12 mg/dL (ref 6–23)
CO2: 29 mEq/L (ref 19–32)
CREATININE: 0.84 mg/dL (ref 0.50–1.10)
Calcium: 9.7 mg/dL (ref 8.4–10.5)
Chloride: 104 mEq/L (ref 96–112)
Glucose, Bld: 86 mg/dL (ref 70–99)
Potassium: 4.4 mEq/L (ref 3.5–5.3)
Sodium: 140 mEq/L (ref 135–145)
Total Protein: 7.1 g/dL (ref 6.0–8.3)

## 2015-01-20 LAB — LIPID PANEL
CHOL/HDL RATIO: 2.5 ratio
Cholesterol: 189 mg/dL (ref 0–200)
HDL: 77 mg/dL (ref >=46)
LDL CALC: 87 mg/dL (ref 0–99)
TRIGLYCERIDES: 123 mg/dL (ref <150)
VLDL: 25 mg/dL (ref 0–40)

## 2015-01-20 LAB — TSH: TSH: 0.895 u[IU]/mL (ref 0.350–4.500)

## 2015-01-20 LAB — VITAMIN D 25 HYDROXY (VIT D DEFICIENCY, FRACTURES): VIT D 25 HYDROXY: 43 ng/mL (ref 30–100)

## 2015-01-21 ENCOUNTER — Telehealth: Payer: Self-pay | Admitting: Obstetrics & Gynecology

## 2015-01-21 LAB — IPS PAP TEST WITH REFLEX TO HPV

## 2015-01-21 NOTE — Telephone Encounter (Signed)
Left message for patient to call back. Need to go over benefits and schedule [facility]

## 2015-01-23 NOTE — Telephone Encounter (Signed)
Spoke with patient. Advised of benefit quote received for [facility].  Patient agreeable. Scheduled [facility]. Advised patient of 72 hour cancellation policy and $767 cancellation fee. Patient agreeable.

## 2015-01-27 ENCOUNTER — Other Ambulatory Visit: Payer: Self-pay

## 2015-01-27 DIAGNOSIS — Z1231 Encounter for screening mammogram for malignant neoplasm of breast: Secondary | ICD-10-CM

## 2015-01-29 ENCOUNTER — Ambulatory Visit
Admission: RE | Admit: 2015-01-29 | Discharge: 2015-01-29 | Disposition: A | Discharge status: Home / Self Care | Payer: BLUE CROSS/BLUE SHIELD | Source: Ambulatory Visit

## 2015-01-29 DIAGNOSIS — Z1231 Encounter for screening mammogram for malignant neoplasm of breast: Secondary | ICD-10-CM

## 2015-02-05 ENCOUNTER — Ambulatory Visit (INDEPENDENT_AMBULATORY_CARE_PROVIDER_SITE_OTHER): Payer: BLUE CROSS/BLUE SHIELD | Admitting: Obstetrics & Gynecology

## 2015-02-05 ENCOUNTER — Ambulatory Visit (INDEPENDENT_AMBULATORY_CARE_PROVIDER_SITE_OTHER): Payer: BLUE CROSS/BLUE SHIELD

## 2015-02-05 ENCOUNTER — Other Ambulatory Visit: Payer: Self-pay | Admitting: Obstetrics & Gynecology

## 2015-02-05 VITALS — BP 102/64 | Wt 135.0 lb

## 2015-02-05 DIAGNOSIS — N950 Postmenopausal bleeding: Secondary | ICD-10-CM

## 2015-02-05 DIAGNOSIS — Z79890 Hormone replacement therapy (postmenopausal): Secondary | ICD-10-CM | POA: Diagnosis not present

## 2015-02-05 DIAGNOSIS — M797 Fibromyalgia: Secondary | ICD-10-CM

## 2015-02-05 NOTE — Progress Notes (Signed)
59 y.o. Courtney Waters here for a pelvic ultrasound with sonohystogram due to continued PMP bleeding on HRT.  Pt taking 1/2 tab of Activella every other day.  Reports she hasn't had any bleeding now in about 10 days.  This is longest stretch without bleeding in several months.  Last endometrial biopsy was 08/07/14.  Patient's last menstrual period was 11/07/2009.  Technique:  Both transabdominal and transvaginal ultrasound examinations of the pelvis were performed. Transabdominal technique was performed for global imaging of the pelvis including uterus, ovaries, adnexal regions, and pelvic cul-de-sac.  It was necessary to proceed with endovaginal exam following the abdominal ultrasound transabdominal exam to visualize the endometrium and adnexa.  Color and duplex Doppler ultrasound was utilized to evaluate blood flow to the ovaries.    FINDINGS: Uterus: 6.7 x 4.0 x 2.4cm Endometrium: 1.24mm Adnexa:  Left: surgically absent     Right: surgically absent Cul de sac: no free fluid  SHSG:  After obtaining appropriate verbal consent from patient, the cervix was visualized using a speculum, and prepped with betadine.  A tenaculum  was not applied to the cervix.  Dilation of the cervix was not necessary. The catheter was passed into the uterus and sterile saline introduced, with the following findings: no intracavitary lesions/findings.  D/W pt findings.  D/w pt complete cessation of HRT vs adding additional progesterone.  She struggles so much with her fibromyalgia and is apprehensive about completely stopping HRT.  She spent several minutes describing her morning routine which is very slow and limited due to her early morning pain.  Has questions about increasing her Cymbalta to 90 mg daily and/or adding Lyrica.  I am not comfortable with either of these questions.  Pt's rheumatologist is Dr. Estanislado Pandy.  Advised to ask these questions to her.  Pt states she will wait for next appt.    For now, pt will  keep menstrual calendar.  No abnormal findings noted today and negative biopsy obtained in October, 2015, so I do not feel I am missing anything.  Pt in agreement with plan.  Will follow up with her in one month.  Assessment:  PMP bleeding, normal ultrasound today S/p laparoscopic BSO 05/13/14 On low dose HRT  Plan:  Keep menstrual calendar Follow up in one month Encouraged pt to follow up with or call Dr. Estanislado Pandy  ~30 minutes spent with patient >50% of time was in face to face discussion of above.

## 2015-05-14 ENCOUNTER — Telehealth: Payer: Self-pay | Admitting: Obstetrics & Gynecology

## 2015-05-14 NOTE — Telephone Encounter (Signed)
Spoke with patient. Patient states that she has three pills left of her Activella and is requesting a refill. Advised on 01/19/2015 Dr.Miller sent in Rx with 12 refills. Advised will need to notify pharmacy that she would like to refill medication and they can proceed. Patient is agreeable. Patient would like to move aex up to March 2017. Last aex was 01/19/2015. Next aex scheduled for 01/25/2016 at 2:45pm with Dr.Miller. Patient is agreeable to date and time.  Routing to provider for final review. Patient agreeable to disposition. Will close encounter.

## 2015-05-14 NOTE — Telephone Encounter (Signed)
Patient has a question regarding her birth control.

## 2015-12-31 ENCOUNTER — Other Ambulatory Visit: Payer: Self-pay

## 2015-12-31 DIAGNOSIS — Z1231 Encounter for screening mammogram for malignant neoplasm of breast: Secondary | ICD-10-CM

## 2016-01-25 ENCOUNTER — Encounter: Payer: Self-pay | Admitting: Obstetrics & Gynecology

## 2016-01-25 ENCOUNTER — Ambulatory Visit (INDEPENDENT_AMBULATORY_CARE_PROVIDER_SITE_OTHER): Payer: BLUE CROSS/BLUE SHIELD | Admitting: Obstetrics & Gynecology

## 2016-01-25 VITALS — BP 120/70 | HR 90 | Resp 16 | Ht 67.0 in | Wt 144.0 lb

## 2016-01-25 DIAGNOSIS — Z01419 Encounter for gynecological examination (general) (routine) without abnormal findings: Secondary | ICD-10-CM

## 2016-01-25 DIAGNOSIS — Z205 Contact with and (suspected) exposure to viral hepatitis: Secondary | ICD-10-CM | POA: Diagnosis not present

## 2016-01-25 DIAGNOSIS — Z0000 Encounter for general adult medical examination without abnormal findings: Secondary | ICD-10-CM | POA: Diagnosis not present

## 2016-01-25 LAB — POCT URINALYSIS DIPSTICK
Bilirubin, UA: NEGATIVE
Blood, UA: NEGATIVE
GLUCOSE UA: NEGATIVE
KETONES UA: NEGATIVE
Nitrite, UA: NEGATIVE
Protein, UA: NEGATIVE
UROBILINOGEN UA: NEGATIVE
pH, UA: 7

## 2016-01-25 LAB — CBC
HCT: 35.3 % — ABNORMAL LOW (ref 36.0–46.0)
HEMOGLOBIN: 11.5 g/dL — AB (ref 12.0–15.0)
MCH: 27.8 pg (ref 26.0–34.0)
MCHC: 32.6 g/dL (ref 30.0–36.0)
MCV: 85.3 fL (ref 78.0–100.0)
MPV: 9.6 fL (ref 8.6–12.4)
PLATELETS: 317 10*3/uL (ref 150–400)
RBC: 4.14 MIL/uL (ref 3.87–5.11)
RDW: 13.3 % (ref 11.5–15.5)
WBC: 6.1 10*3/uL (ref 4.0–10.5)

## 2016-01-25 MED ORDER — ZOSTER VACCINE LIVE 19400 UNT/0.65ML ~~LOC~~ SOLR: 0.6500 mL | Freq: Once | SUBCUTANEOUS | Status: DC

## 2016-01-25 NOTE — Progress Notes (Signed)
60 y.o. [id number] MarriedCaucasianF here for annual exam.  Doing well but reports her husband is having some issues with dizziness.  Having an MRI/MRA scheduled this week for additional evaluation.  Reports son just had a varicocele removed.  He had a lot of post-op swelling and it took a month to get better.  H/O laparoscopic BSO due to cystadenoma 7/15.  Denies vaginal bleeding.  Wants to get the Zostavax.  Did have chicken pox as a child.  PCP:  Dr. Sharlett Iles.      Patient's last menstrual period was 11/07/2009.          Sexually active: Yes.    The current method of family planning is post menopausal status.    Exercising: Yes.  water fibro swim class Smoker:  no  Health Maintenance: Pap:  01/19/15 Neg. 12/13/11 Neg. HR HPV:neg History of abnormal Pap:  no MMG:  01/30/15 BIRADS1:neg. Scheduled 02/02/16 Colonoscopy:  07/07/14 Normal  BMD:   01/17/13 Normal, 1.0/-0.4 TDaP:  01/2007 Screening Labs: here, Hb today: 11.1, Urine today: WBC=Trace   reports that she has never smoked. She has never used smokeless tobacco. She reports that she drinks about 0.6 - 1.2 oz of alcohol per week. She reports that she does not use illicit drugs.  Past Medical History  Diagnosis Date  . Hearing loss in right ear   . MVP (mitral valve prolapse)     Hx  . Back pain 12/01/1998    compression fracture L1 from sledding accident  . SVD (spontaneous vaginal delivery)     x 2  . Depression   . Arthritis     hands and neck  . Fibromyalgia     Past Surgical History  Procedure Laterality Date  . Tonsillectomy  age 26  . Wisdom tooth extraction  age 5  . 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 Colonoscopy    . Laparoscopic bilateral salpingo oopherectomy Bilateral 05/13/2014    Procedure: LAPAROSCOPIC BILATERAL SALPINGO OOPHORECTOMY;  Surgeon: Lyman Speller, MD;  Location: [facility];  Service: Gynecology;  Laterality:  Bilateral;  . Bunionectomy Left 10/16/14    Dr. Durward Fortes    Current Outpatient Prescriptions  Medication Sig Dispense Refill  . Biotin 5000 MCG CAPS Take 1 capsule by mouth daily.    Marland Kitchen CALCIUM PO Take by mouth. With magnesium and zinc    . Cholecalciferol (VITAMIN D) 2000 UNITS CAPS Take 1 capsule by mouth daily.    . DULoxetine (CYMBALTA) 60 MG capsule Take 1 capsule by mouth daily.    . methocarbamol (ROBAXIN) 500 MG tablet Take 1 tablet by mouth 3 (three) times daily as needed for muscle spasms.     Marland Kitchen oxyCODONE-acetaminophen (PERCOCET/ROXICET) 5-325 MG per tablet Take 1-2 tablets by mouth every 4 (four) hours as needed for moderate pain. 30 tablet 0  . NON FORMULARY Reported on 01/25/2016     No current facility-administered medications for this visit.    Family History  Problem Relation Age of Onset  . Diabetes Mother   . Hearing loss Mother   . Cancer Father     GI  . Breast cancer Maternal Aunt   . Heart disease Sister   . Hearing loss Brother   . Ovarian cancer Other     MGM, mid 72's    ROS:  Pertinent items are noted in HPI.  Otherwise, a comprehensive ROS was  negative.  Exam:   BP 120/70 mmHg  Pulse 90  Resp 16  Ht 5\' 7"  (1.702 m)  Wt 144 lb (65.318 kg)  BMI 22.55 kg/m2  LMP 11/07/2009  Weight change: -4#   Height: 5\' 7"  (170.2 cm)  Ht Readings from Last 3 Encounters:  01/25/16 5\' 7"  (1.702 m)  01/19/15 5' 7.25" (1.708 m)  08/07/14 5' 7.25" (1.708 m)    General appearance: alert, cooperative and appears stated age Head: Normocephalic, without obvious abnormality, atraumatic Neck: no adenopathy, supple, symmetrical, trachea midline and thyroid normal to inspection and palpation Lungs: clear to auscultation bilaterally Breasts: normal appearance, no masses or tenderness Heart: regular rate and rhythm Abdomen: soft, non-tender; bowel sounds normal; no masses,  no organomegaly Extremities: extremities normal, atraumatic, no cyanosis or edema Skin: Skin  color, texture, turgor normal. No rashes or lesions Lymph nodes: Cervical, supraclavicular, and axillary nodes normal. No abnormal inguinal nodes palpated Neurologic: Grossly normal   Pelvic: External genitalia:  no lesions              Urethra:  normal appearing urethra with no masses, tenderness or lesions              Bartholins and Skenes: normal                 Vagina: normal appearing vagina with normal color and discharge, no lesions              Cervix: no lesions              Pap taken: No. Bimanual Exam:  Uterus:  normal size, contour, position, consistency, mobility, non-tender              Adnexa: normal adnexa and no mass, fullness, tenderness               Rectovaginal: Confirms               Anus:  normal sphincter tone, no lesions  Chaperone was present for exam.  A:  Well Woman with normal exam PMP, no HRT History of fibromyalgia and chronic back pain.  Followed at [facility] Vit D deficiency  P: Mammogram yearly.  Pt has appt scheduled. Pap neg 2016.  Pap with neg HR HPV 2/13. CBC and Vit D Hep C antibody Order for Zostavax to pharmacy.   Plan Tdap 3/18 return annually or prn

## 2016-01-26 LAB — HEPATITIS C ANTIBODY: HCV Ab: NEGATIVE

## 2016-01-26 LAB — VITAMIN D 25 HYDROXY (VIT D DEFICIENCY, FRACTURES): Vit D, 25-Hydroxy: 37 ng/mL (ref 30–100)

## 2016-01-29 LAB — HEMOGLOBIN, FINGERSTICK: HEMOGLOBIN, FINGERSTICK: 11.1 g/dL — AB (ref 12.0–16.0)

## 2016-02-02 ENCOUNTER — Ambulatory Visit
Admission: RE | Admit: 2016-02-02 | Discharge: 2016-02-02 | Disposition: A | Discharge status: Home / Self Care | Payer: BLUE CROSS/BLUE SHIELD | Source: Ambulatory Visit

## 2016-02-02 DIAGNOSIS — Z1231 Encounter for screening mammogram for malignant neoplasm of breast: Secondary | ICD-10-CM

## 2016-02-07 IMAGING — MR MR LUMBAR SPINE W/O CM
5 series · 41 of 48 positions shown · non-contrast
Comparison: None.

CLINICAL DATA: Remote injury with fracture of L1 2555. Mid to lower
back pain with right side pain for the past 2 weeks.

EXAM:
MRI LUMBAR SPINE WITHOUT CONTRAST
TECHNIQUE: Multiplanar, multisequence MR imaging of the lumbar spine was
performed. No intravenous contrast was administered.

[Series 3: T2 · sagittal · 4.0mm · 0.88mm/px · 6 of 15 slices shown (1 of 2)]
[im 1/15]
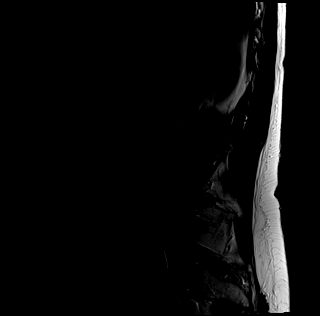
[im 3/15]
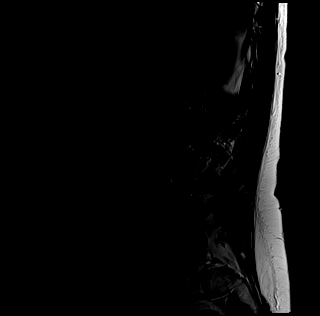
[im 6/15]
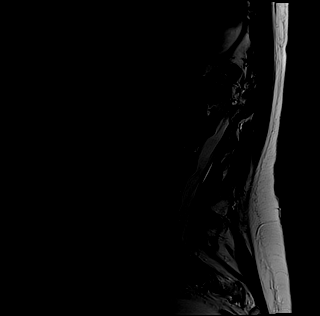
[im 9/15]
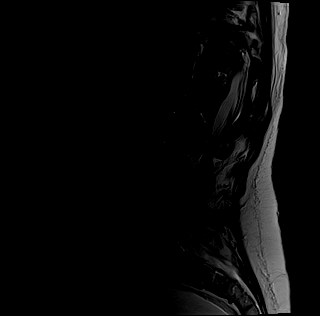
[im 12/15]
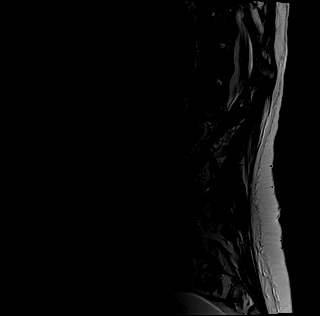
[im 15/15]
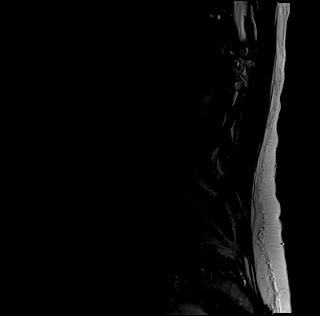

[Series 4: tirm sag · sagittal · 4.0mm · 0.55mm/px · 6 of 15 slices shown]
[im 1/15]
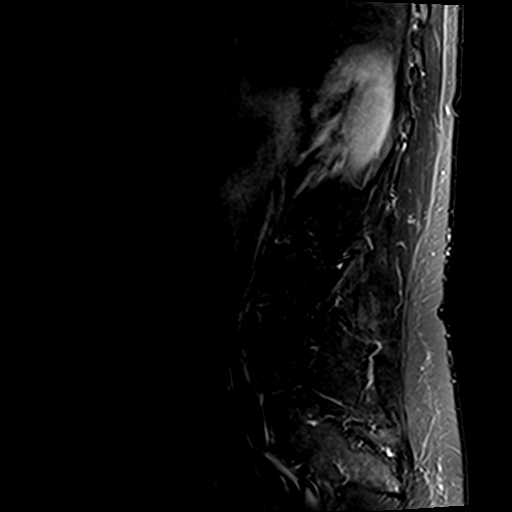
[im 3/15]
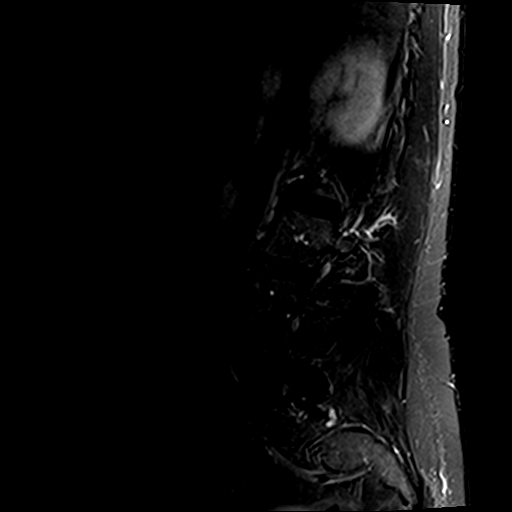
[im 6/15]
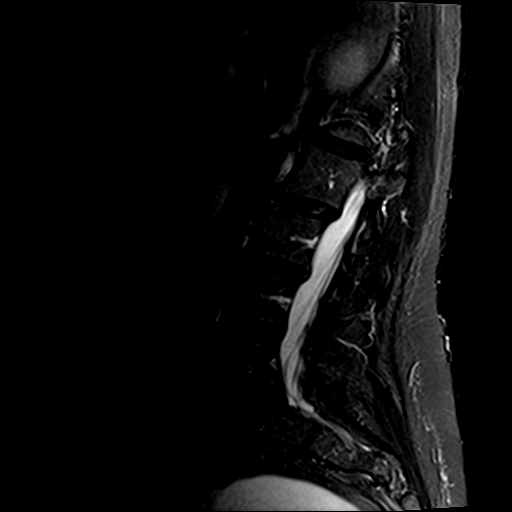
[im 9/15]
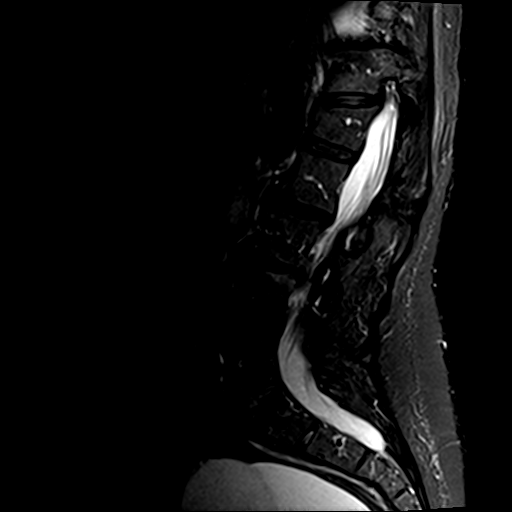
[im 12/15]
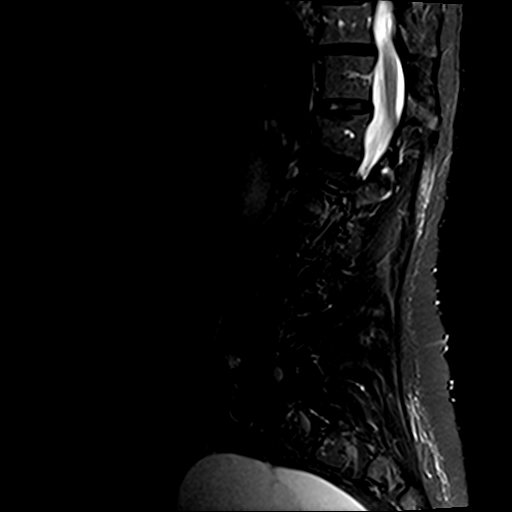
[im 15/15]
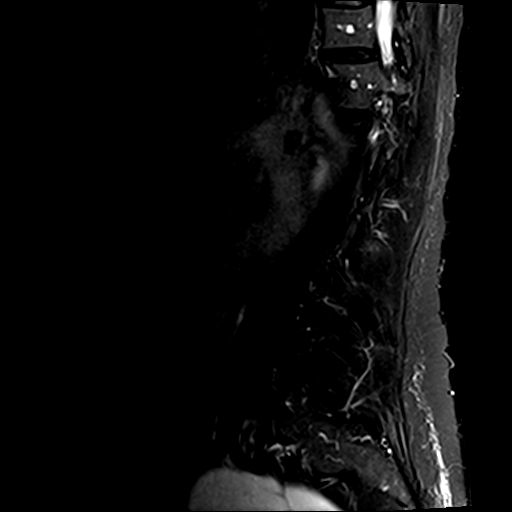

[Series 5: T1 · sagittal · 4.0mm · 0.88mm/px · 6 of 15 slices shown (1 of 2)]
[im 1/15]
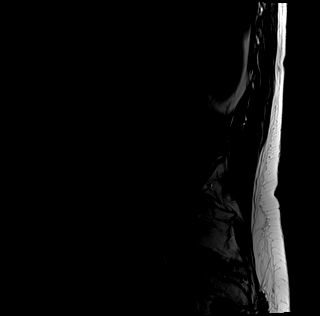
[im 3/15]
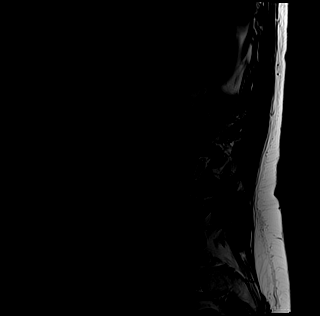
[im 6/15]
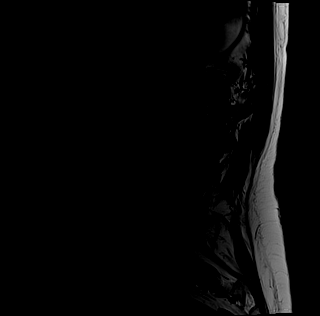
[im 9/15]
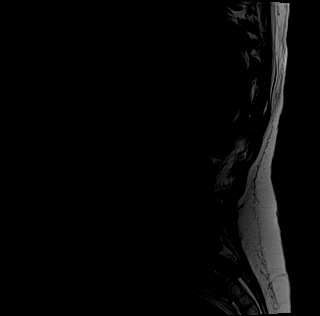
[im 12/15]
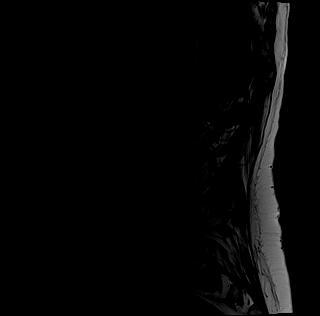
[im 15/15]
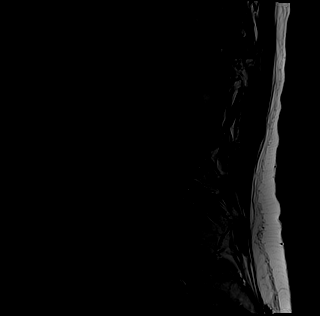

[Series 6: T1 · axial · 4.0mm · 0.70mm/px · z∈[-112,+89]mm · 9 of 39 slices shown (2 of 2)]
[im 1/39]
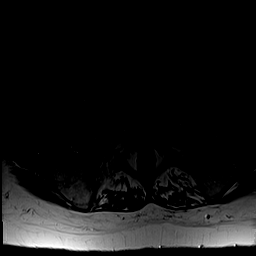
[im 6/39]
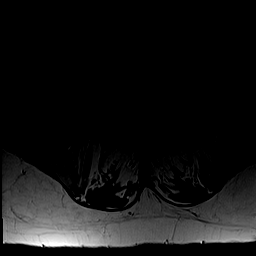
[im 11/39]
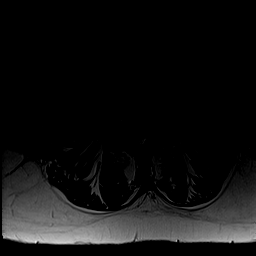
[im 17/39]
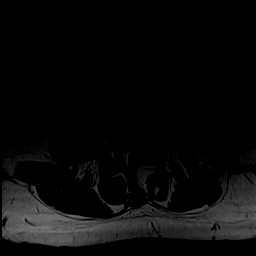
[im 20/39]
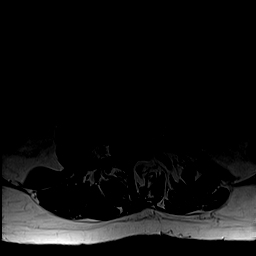
[im 22/39]
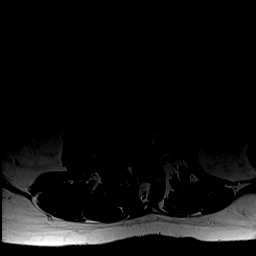
[im 28/39]
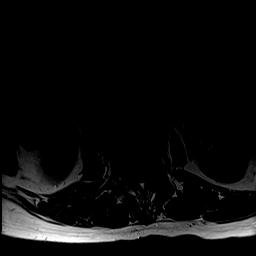
[im 33/39]
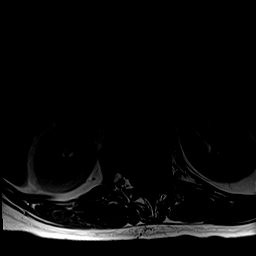
[im 39/39]
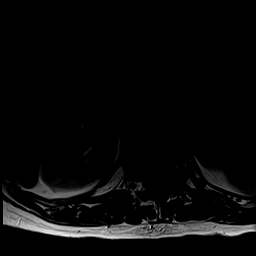

[Series 7: T2 · axial · 4.0mm · 0.70mm/px · z∈[-112,+89]mm · 14 of 39 slices shown (2 of 2)]
[im 1/39]
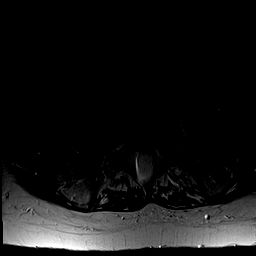
[im 3/39]
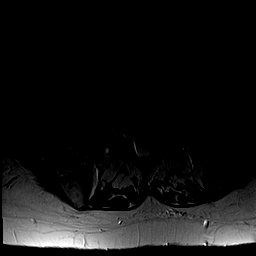
[im 6/39]
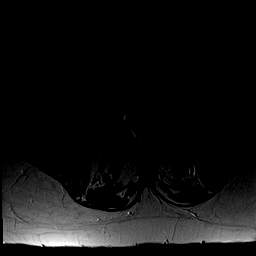
[im 9/39]
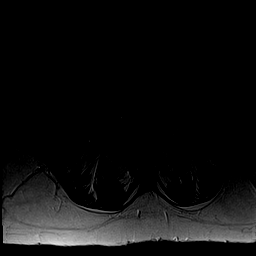
[im 11/39]
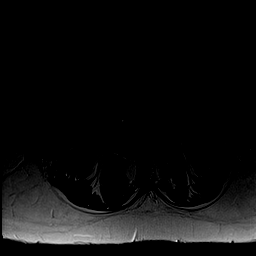
[im 14/39]
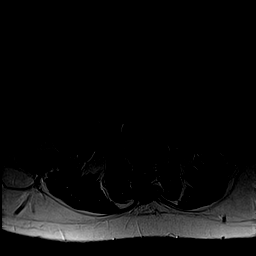
[im 17/39]
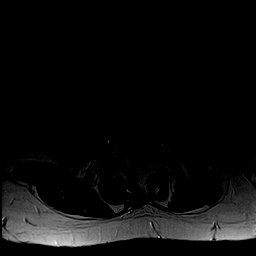
[im 20/39]
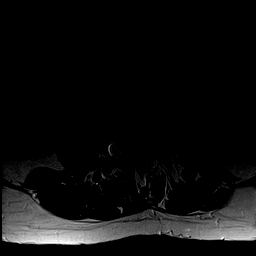
[im 22/39]
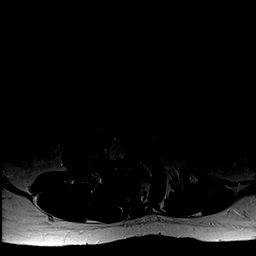
[im 25/39]
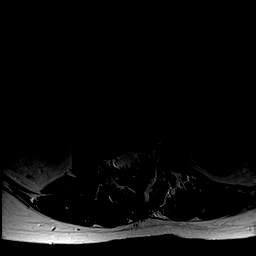
[im 28/39]
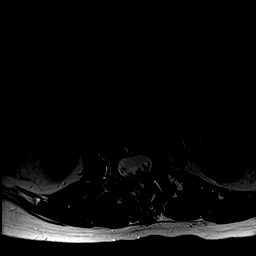
[im 30/39]
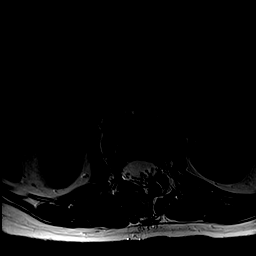
[im 33/39]
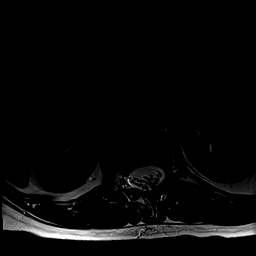
[im 39/39]
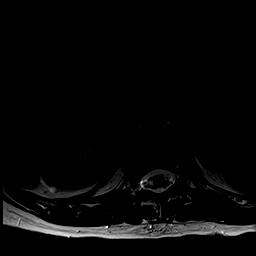

[41 of 48 positions shown; findings below may reference images not displayed]

FINDINGS: Last fully open disk space is labeled L5-S1. Present examination
incorporates from T11 through the lower sacrum.

Conus L1 level.

Fluid structure arises from the pelvis is a completely assessed on
present exam. Although typically one would assume that this
represents a distended bladder, there is a lobulated component with
2 different signal intensities and therefore primary pelvic cystic
mass such as ovarian lesion needs to be considered and pelvic
ultrasound recommended for further delineation.

Small renal lesions may be cysts although incompletely assessed.
There may also be a sub cm right lobe liver cyst.

Remote anterior wedge compression fracture of L1 involving the
superior endplate with 40% loss height anteriorly.

No acute osseous abnormality noted.

Scoliosis with superimposed degenerative changes including:

T11-12: Facet joint degenerative changes. Foraminal narrowing on the
right. Left foramen not imaged. Minimal anterior slip T11. Minimal
bulge. Axial images not performed. No significant spinal stenosis.

T12-L1: Mild facet joint degenerative changes greater on left.
Minimal posterior projection of the posterior superior aspect of the
compressed L1 vertebral body. No significant spinal stenosis or
foraminal narrowing.

L1-2:  Mild facet joint degenerative changes.  Minimal bulge.

L2-3: Facet joint degenerative changes. Mild rotation and minimal
retrolisthesis with bulge. No significant spinal stenosis or
foraminal narrowing.

L3-4: Facet joint degenerative changes. Bulge/spur greatest left
lateral position without compression of the exiting nerve root. Very
mild spinal stenosis.

L4-5: Facet joint degenerative changes. Bulge/ spur slightly greater
to the right without nerve root compression or significant spinal
stenosis.

L5-S1:  Mild facet joint degenerative changes.
IMPRESSION: Remote L1 compression fracture. Scoliosis with superimposed
degenerative changes but without significant spinal stenosis or
nerve root compression as detailed above.

Findings raise the possibility of large cystic ovarian mass as noted
above. Ultrasound recommended for further delineation.

These results will be called to the ordering clinician or
representative by the Radiologist Assistant, and communication
documented in the PACS or zVision Dashboard.

## 2016-03-29 ENCOUNTER — Ambulatory Visit: Payer: BLUE CROSS/BLUE SHIELD | Admitting: Obstetrics & Gynecology

## 2016-08-15 ENCOUNTER — Telehealth: Payer: Self-pay | Admitting: Obstetrics & Gynecology

## 2016-08-15 NOTE — Telephone Encounter (Signed)
Call to patient. Patient states she will try to drop lab work off later this afternoon for Dr. Sabra Heck to review. Patient appreciative of phone call.    Routing to provider for review.

## 2016-08-15 NOTE — Telephone Encounter (Signed)
Returned call to patient. Patient states that for the last 3 months she has been experiencing excessive sweating. She states, "so much so- that I am constantly having to wipe my face or go to the bathroom several times a day to wipe my back." Patient states that her daughter has even noticed saying, "mom your face is wet." Patient states she did not experience this symptom going through menopause and that this is different from the sweating associated with hot flashes. Patient states she saw a PA at her rheumatology clinic a week and a half ago, and they did not seemed concerned by it, but did do a lot of blood work that she has copies of. She states she then saw Dr. Greta Doom, at [facility] last week and she did seem to be concerned by the excessive sweating thinking it may be related to her thyroid. Office visit offered with Dr. Sabra Heck to discuss symptoms, but patient declined stating all she would be only able to tell her what the lab work showed. Patient would just like to drop off a copy of her recent labs for Dr. Sabra Heck to review. RN advised would review this message with Dr. Sabra Heck and our office would return call if any additional recommendations.    Routing to provider for review.

## 2016-08-15 NOTE — Telephone Encounter (Signed)
Patient requesting to speak with the nurse about "excessive sweating."

## 2016-08-15 NOTE — Telephone Encounter (Signed)
It's fine for her to drop off lab work if she's like for me to review it.  Thanks.

## 2016-08-25 ENCOUNTER — Telehealth: Payer: Self-pay

## 2016-08-25 NOTE — Telephone Encounter (Signed)
Spoke with patient. Patient states that she is unable to speak right now and would like to return call to the office tomorrow.   Need to advise patient that Dr.Miller has received her lab results from [facility] and all labs are normal including her TSH level. There are additional tests that can be done. Per Dr.Miller probably best to start with her PCP.

## 2016-09-16 NOTE — Telephone Encounter (Signed)
Left message to call Kaitlyn at [phone]. 

## 2016-09-21 NOTE — Telephone Encounter (Signed)
Dr.Miller, I have attempted to reach this patient x 2 regarding results and recommendations. Okay to close encounter?

## 2016-09-23 NOTE — Telephone Encounter (Signed)
Ok to close encounter.  Thanks. 

## 2017-01-04 ENCOUNTER — Telehealth: Payer: Self-pay | Admitting: Rheumatology

## 2017-01-04 NOTE — Telephone Encounter (Signed)
Patient husband Rob called regarding authorization for the Euflexxa/Synvisc injection that they was told back around September 2017 that someone would send in to get authorization from [place]. They have not heard from Dr. Estanislado Pandy office nor [facility] regarding the matter. Patient coming for an appointment on Thurday 02/02/17 @2 :00pm and wanted to have things in order before then. Pt request a call back asap.

## 2017-01-26 DIAGNOSIS — M4126 Other idiopathic scoliosis, lumbar region: Secondary | ICD-10-CM | POA: Insufficient documentation

## 2017-01-26 DIAGNOSIS — M19042 Primary osteoarthritis, left hand: Secondary | ICD-10-CM

## 2017-01-26 DIAGNOSIS — M47816 Spondylosis without myelopathy or radiculopathy, lumbar region: Secondary | ICD-10-CM | POA: Insufficient documentation

## 2017-01-26 DIAGNOSIS — M19072 Primary osteoarthritis, left ankle and foot: Secondary | ICD-10-CM

## 2017-01-26 DIAGNOSIS — M19041 Primary osteoarthritis, right hand: Secondary | ICD-10-CM | POA: Insufficient documentation

## 2017-01-26 DIAGNOSIS — M19071 Primary osteoarthritis, right ankle and foot: Secondary | ICD-10-CM | POA: Insufficient documentation

## 2017-01-26 NOTE — Progress Notes (Signed)
Office Visit Note  Patient: Courtney Waters             Date of Birth: 06-Nov-1956           MRN: [id number]             PCP: Donnajean Lopes, MD Referring: Leanna Battles, MD Visit Date: 02/02/2017 Occupation: @GUAROCC @    Subjective:  Knee pain.   History of Present Illness: Courtney Waters is a 61 y.o. female with history of fibromyalgia syndrome disc disease and osteoarthritis. She states she continues to have pain and stiffness in her bilateral knee joints. She failed a cortisone injection in the past and also physical therapy she is awaiting to get viscous supplement injections to her knee joints. She's been also having discomfort in her left [facility] joint she has appointment coming up with Dr. Amedeo Plenty next month. She's been having discomfort in her C-spine and trapezius area. She rates her pain from fibromyalgia and a scale of 0-10 anywhere from 2-8 and fatigue about 8.  Activities of Daily Living:  Patient reports morning stiffness for2  hours.   Patient Denies nocturnal pain.  Difficulty dressing/grooming: Denies Difficulty climbing stairs: Reports Difficulty getting out of chair: Reports Difficulty using hands for taps, buttons, cutlery, and/or writing: Denies   Review of Systems  Constitutional: Positive for fatigue. Negative for night sweats, weight gain, weight loss and weakness.  HENT: Negative for mouth sores, trouble swallowing, trouble swallowing, mouth dryness and nose dryness.   Eyes: Positive for dryness. Negative for pain, redness and visual disturbance.  Respiratory: Negative for cough, shortness of breath and difficulty breathing.   Cardiovascular: Negative for chest pain, palpitations, hypertension, irregular heartbeat and swelling in legs/feet.  Gastrointestinal: Negative for blood in stool, constipation and diarrhea.  Endocrine: Negative for increased urination.  Genitourinary: Negative for vaginal dryness.  Musculoskeletal: Positive for arthralgias, joint  pain, myalgias, morning stiffness and myalgias. Negative for joint swelling, muscle weakness and muscle tenderness.  Skin: Negative for color change, rash, hair loss, skin tightness, ulcers and sensitivity to sunlight.  Allergic/Immunologic: Negative for susceptible to infections.  Neurological: Negative for dizziness, memory loss and night sweats.  Hematological: Negative for swollen glands.  Psychiatric/Behavioral: Negative for depressed mood and sleep disturbance. The patient is not nervous/anxious.     PMFS History:  Patient Active Problem List   Diagnosis Date Noted  . Spondylosis of lumbar region without myelopathy or radiculopathy 01/26/2017  . Primary osteoarthritis of both hands 01/26/2017  . Primary osteoarthritis of both feet 01/26/2017  . Other idiopathic scoliosis, lumbar region 01/26/2017  . Fibromyalgia 02/05/2015  . METATARSALGIA 08/14/2007  . BUNIONS, BILATERAL 08/14/2007  . UNEQUAL LEG LENGTH, ACQUIRED 08/14/2007  . HEEL PAIN, LEFT 08/08/2007  . Osteopenia 08/08/2007    Past Medical History:  Diagnosis Date  . Arthritis    hands and neck  . Back pain 12/01/1998   compression fracture L1 from sledding accident  . Depression   . Fibromyalgia   . Hearing loss in right ear   . MVP (mitral valve prolapse)    Hx  . SVD (spontaneous vaginal delivery)    x 2    Family History  Problem Relation Age of Onset  . Diabetes Mother   . Hearing loss Mother   . Cancer Father     GI  . Heart disease Sister   . Hearing loss Brother   . Breast cancer Maternal Aunt   . Ovarian cancer Other  MGM, mid 45's   Past Surgical History:  Procedure Laterality Date  . BUNIONECTOMY Left 10/16/14   Dr. Durward Fortes  . COLONOSCOPY    . 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 LAPAROSCOPIC BILATERAL SALPINGO OOPHERECTOMY Bilateral 05/13/2014   Procedure: LAPAROSCOPIC BILATERAL SALPINGO OOPHORECTOMY;   Surgeon: Lyman Speller, MD;  Location: [facility];  Service: Gynecology;  Laterality: Bilateral;  . TONSILLECTOMY  age 49  . WISDOM TOOTH EXTRACTION  age 29   Social History   Social History Narrative  . No narrative on file     Objective: Vital Signs: BP 126/77   Pulse 83   Resp 14   Ht 5' 7.5" (1.715 m)   Wt 153 lb (69.4 kg)   LMP 11/07/2009 Comment: spotting x 2days  BMI 23.61 kg/m    Physical Exam  Constitutional: She is oriented to person, place, and time. She appears well-developed and well-nourished.  HENT:  Head: Normocephalic and atraumatic.  Eyes: Conjunctivae and EOM are normal.  Neck: Normal range of motion.  Cardiovascular: Normal rate, regular rhythm, normal heart sounds and intact distal pulses.   Pulmonary/Chest: Effort normal and breath sounds normal.  Abdominal: Soft. Bowel sounds are normal.  Lymphadenopathy:    She has no cervical adenopathy.  Neurological: She is alert and oriented to person, place, and time.  Skin: Skin is warm and dry. Capillary refill takes less than 2 seconds.  Psychiatric: She has a normal mood and affect. Her behavior is normal.  Nursing note and vitals reviewed.    Musculoskeletal Exam: C-spine and thoracic lumbar spine good range of motion. She had bilateral trapezius spasm. Shoulder joints elbow joints wrist joint MCPs PIPs DIPs with good range of motion with no synovitis. Hip joints knee joints ankles MTPs PIPs with good range of motion with no synovitis. Fibromyalgia tender points were 16 out of 18 positive with generalized hyperalgesia. She is crepitus with range of motion of bilateral knee joints.  CDAI Exam: No CDAI exam completed.    Investigation: Findings:  03-2011, her CBC, CMP, CK, TSH, RF, CCP, UA, SPEP, were negative.   08/04/2016 CBC CMP Thyroid panel , A1C, Vitamin D normal     Imaging: No results found.  Speciality Comments: No specialty comments available.    Procedures:  No procedures  performed Allergies: Patient has no known allergies.   Assessment / Plan:     Visit Diagnoses: Fibromyalgia -patient states that she's been having a flare of fibromyalgia and increased pain. She is going to pain clinic now. She also states that the pain management doctor wants to start her on Lyrica. She requested a prescription for muscle relaxer. After different options and their side effects were discussed she was given a prescription for Zanaflex 4 mg by mouth daily at bedtime. Total 30 tablets with 2 refills were given. Plan: Ambulatory referral to Physical Therapy  Spondylosis of lumbar region without myelopathy or radiculopathy: Chronic pain  Other idiopathic scoliosis, lumbar region  Primary osteoarthritis of both hands: She is will be seeing Dr. Amedeo Plenty next month for osteoarthritis in her hand. She's been having marked discomfort in her left CMC joint.  Primary osteoarthritis of both feet: Proper fitting shoes were discussed.  Osteoarthritis bilateral knee joints.: Patient has failed cortisone injection and has tried physical therapy without much results in the past. She wants to start to [place] supplement and injection. We'll check the  status today.  Neck pain - Plan: Ambulatory referral to Physical Therapy    Orders: Orders Placed This Encounter  Procedures  . Ambulatory referral to Physical Therapy   Meds ordered this encounter  Medications  . tiZANidine (ZANAFLEX) 4 MG tablet    Sig: Take 1 tablet (4 mg total) by mouth at bedtime as needed for muscle spasms.    Dispense:  30 tablet    Refill:  2    Face-to-face time spent with patient was 35 minutes. 50% of time was spent in counseling and coordination of care.  Follow-Up Instructions: Return in about 6 months (around 08/05/2017) for FMS, Osteoarthritis.   Bo Merino, MD  Note - This record has been created using Editor, commissioning.  Chart creation errors have been sought, but may not always  have been located.  Such creation errors do not reflect on  the standard of medical care.

## 2017-01-31 ENCOUNTER — Other Ambulatory Visit (HOSPITAL_COMMUNITY)
Admission: RE | Admit: 2017-01-31 | Discharge: 2017-01-31 | Disposition: A | Discharge status: Home / Self Care | Payer: BLUE CROSS/BLUE SHIELD | Source: Ambulatory Visit | Attending: Obstetrics & Gynecology | Admitting: Obstetrics & Gynecology

## 2017-01-31 ENCOUNTER — Encounter: Payer: Self-pay | Admitting: Obstetrics & Gynecology

## 2017-01-31 ENCOUNTER — Ambulatory Visit: Payer: BLUE CROSS/BLUE SHIELD | Admitting: Obstetrics & Gynecology

## 2017-01-31 VITALS — BP 126/82 | HR 82 | Resp 14 | Ht 67.0 in | Wt 152.0 lb

## 2017-01-31 DIAGNOSIS — Z124 Encounter for screening for malignant neoplasm of cervix: Secondary | ICD-10-CM | POA: Diagnosis not present

## 2017-01-31 DIAGNOSIS — Z01419 Encounter for gynecological examination (general) (routine) without abnormal findings: Secondary | ICD-10-CM | POA: Diagnosis not present

## 2017-01-31 DIAGNOSIS — Z0000 Encounter for general adult medical examination without abnormal findings: Secondary | ICD-10-CM

## 2017-01-31 DIAGNOSIS — Z23 Encounter for immunization: Secondary | ICD-10-CM

## 2017-01-31 DIAGNOSIS — R899 Unspecified abnormal finding in specimens from other organs, systems and tissues: Secondary | ICD-10-CM

## 2017-01-31 LAB — COMPREHENSIVE METABOLIC PANEL
ALBUMIN: 4.4 g/dL (ref 3.6–5.1)
ALK PHOS: 62 U/L (ref 33–130)
ALT: 12 U/L (ref 6–29)
AST: 17 U/L (ref 10–35)
BUN: 19 mg/dL (ref 7–25)
CO2: 28 mmol/L (ref 20–31)
CREATININE: 1.05 mg/dL — AB (ref 0.50–0.99)
Calcium: 10.6 mg/dL — ABNORMAL HIGH (ref 8.6–10.4)
Chloride: 102 mmol/L (ref 98–110)
Glucose, Bld: 90 mg/dL (ref 65–99)
Potassium: 4.7 mmol/L (ref 3.5–5.3)
SODIUM: 138 mmol/L (ref 135–146)
TOTAL PROTEIN: 7.5 g/dL (ref 6.1–8.1)
Total Bilirubin: 0.6 mg/dL (ref 0.2–1.2)

## 2017-01-31 LAB — CBC
HCT: 38 % (ref 35.0–45.0)
Hemoglobin: 12.1 g/dL (ref 11.7–15.5)
MCH: 26.9 pg — ABNORMAL LOW (ref 27.0–33.0)
MCHC: 31.8 g/dL — ABNORMAL LOW (ref 32.0–36.0)
MCV: 84.4 fL (ref 80.0–100.0)
MPV: 9.8 fL (ref 7.5–12.5)
PLATELETS: 390 10*3/uL (ref 140–400)
RBC: 4.5 MIL/uL (ref 3.80–5.10)
RDW: 13.8 % (ref 11.0–15.0)
WBC: 5.6 10*3/uL (ref 3.8–10.8)

## 2017-01-31 NOTE — Progress Notes (Signed)
61 y.o. G22P2002 Married Caucasian F here for annual exam.  Doing well.  No vaginal bleeding.  Daughter lives in [place].  Son is in [place].  He works with [employer] and will be moving to [place] as the whole division for the company is moving.    Denies vaginal bleeding.  Reports she did have some increased hot flashes this past fall.  This has improved and they are not as intense but still having them.    Has chronic back pain.  Seeing pain management.  Had nerve ablation last year.    Patient's last menstrual period was 11/07/2009.          Sexually active: Yes.    The current method of family planning is post menopausal status.    Exercising: Yes.    swim classes Smoker:  no  Health Maintenance: Pap:  01/19/15 negative  History of abnormal Pap:  no MMG:  02/02/16 BIRADS 1 negative.  Pt aware this is due. Colonoscopy:  07/07/14 normal  BMD:   01/17/13 normal  TDaP:  2008  Pneumonia vaccine(s):  never Zostavax:   Will do with PCP. Hep C testing: 01/25/16 negative Screening Labs: draw at end of appointment, Hb today: same   reports that she has never smoked. She has never used smokeless tobacco. She reports that she drinks about 0.6 - 1.2 oz of alcohol per week . She reports that she does not use drugs.  Past Medical History:  Diagnosis Date  . Arthritis    hands and neck  . Back pain 12/01/1998   compression fracture L1 from sledding accident  . Depression   . Fibromyalgia   . Hearing loss in right ear   . MVP (mitral valve prolapse)    Hx  . SVD (spontaneous vaginal delivery)    x 2    Past Surgical History:  Procedure Laterality Date  . BUNIONECTOMY Left 10/16/14   Dr. Durward Fortes  . COLONOSCOPY    . 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 LAPAROSCOPIC BILATERAL SALPINGO OOPHERECTOMY Bilateral 05/13/2014   Procedure: LAPAROSCOPIC BILATERAL SALPINGO OOPHORECTOMY;  Surgeon: Lyman Speller, MD;  Location: [facility];  Service: Gynecology;  Laterality: Bilateral;  . TONSILLECTOMY  age 35  . WISDOM TOOTH EXTRACTION  age 30    Current Outpatient Prescriptions on File Prior to Visit  Medication Sig Dispense Refill  . Biotin 5000 MCG CAPS Take 1 capsule by mouth daily.    Marland Kitchen CALCIUM PO Take by mouth. With magnesium and zinc    . Cholecalciferol (VITAMIN D) 2000 UNITS CAPS Take 1 capsule by mouth daily. 5,000 IU    . DULoxetine (CYMBALTA) 60 MG capsule Take 1 capsule by mouth daily.    . NON FORMULARY Reported on 01/25/2016    . oxyCODONE-acetaminophen (PERCOCET/ROXICET) 5-325 MG per tablet Take 1-2 tablets by mouth every 4 (four) hours as needed for moderate pain. 30 tablet 0   No current facility-administered medications on file prior to visit.      Family History  Problem Relation Age of Onset  . Diabetes Mother   . Hearing loss Mother   . Cancer Father     GI  . Breast cancer Maternal Aunt   . Heart disease Sister   . Hearing loss Brother   . Ovarian cancer Other     MGM, mid 37's    ROS:  Pertinent items  are noted in HPI.  Otherwise, a comprehensive ROS was negative.  Exam:   BP 126/82 (BP Location: Right Arm, Patient Position: Sitting, Cuff Size: Normal)   Pulse 82   Resp 14   Ht 5\' 7"  (1.702 m)   Wt 152 lb (68.9 kg)   LMP 11/07/2009 Comment: spotting x 2days  BMI 23.81 kg/m   Weight change: +8#  Height: 5\' 7"  (170.2 cm)  Ht Readings from Last 3 Encounters:  01/31/17 5\' 7"  (1.702 m)  01/25/16 5\' 7"  (1.702 m)  01/19/15 5' 7.25" (1.708 m)   General appearance: alert, cooperative and appears stated age Head: Normocephalic, without obvious abnormality, atraumatic Neck: no adenopathy, supple, symmetrical, trachea midline and thyroid normal to inspection and palpation Lungs: clear to auscultation bilaterally Breasts: normal appearance, no masses or tenderness Heart: regular rate and rhythm Abdomen: soft, non-tender; bowel sounds normal; no masses,  no  organomegaly Extremities: extremities normal, atraumatic, no cyanosis or edema Skin: Skin color, texture, turgor normal. No rashes or lesions Lymph nodes: Cervical, supraclavicular, and axillary nodes normal. No abnormal inguinal nodes palpated Neurologic: Grossly normal   Pelvic: External genitalia:  no lesions              Urethra:  normal appearing urethra with no masses, tenderness or lesions              Bartholins and Skenes: normal                 Vagina: normal appearing vagina with normal color and discharge, no lesions              Cervix: no lesions              Pap taken: Yes.   Bimanual Exam:  Uterus:  normal size, contour, position, consistency, mobility, non-tender              Adnexa: normal adnexa and no mass, fullness, tenderness               Rectovaginal: Confirms               Anus:  normal sphincter tone, no lesions  Chaperone was present for exam.  A:  Well Woman with normal exam PMP, no HRT Fibromyalgia Chronic back pain.  Followed at [facility]. Vit D deficiency s/p laparoscopic BSO 7/15  P:   Mammogram yearly.  Guidelines reviewed.  Pt aware this is due and states she will call and schedule it pap smear and HR HPV obtained today CBC, CMP, Thyroid panel with TSH Tdap will be update today New shingles vaccine discussed.  Prescription given to pt to get at pharmacy. return annually or prn

## 2017-02-01 LAB — THYROID PANEL WITH TSH
FREE THYROXINE INDEX: 2.4 (ref 1.4–3.8)
T3 Uptake: 26 % (ref 22–35)
T4, Total: 9.1 ug/dL (ref 4.5–12.0)
TSH: 0.89 m[IU]/L

## 2017-02-02 ENCOUNTER — Ambulatory Visit (INDEPENDENT_AMBULATORY_CARE_PROVIDER_SITE_OTHER): Payer: BLUE CROSS/BLUE SHIELD | Admitting: Rheumatology

## 2017-02-02 ENCOUNTER — Encounter: Payer: Self-pay | Admitting: Rheumatology

## 2017-02-02 VITALS — BP 126/77 | HR 83 | Resp 14 | Ht 67.5 in | Wt 153.0 lb

## 2017-02-02 DIAGNOSIS — M797 Fibromyalgia: Secondary | ICD-10-CM | POA: Diagnosis not present

## 2017-02-02 DIAGNOSIS — M19041 Primary osteoarthritis, right hand: Secondary | ICD-10-CM | POA: Diagnosis not present

## 2017-02-02 DIAGNOSIS — M19072 Primary osteoarthritis, left ankle and foot: Secondary | ICD-10-CM

## 2017-02-02 DIAGNOSIS — M19042 Primary osteoarthritis, left hand: Secondary | ICD-10-CM

## 2017-02-02 DIAGNOSIS — M19071 Primary osteoarthritis, right ankle and foot: Secondary | ICD-10-CM

## 2017-02-02 DIAGNOSIS — M4126 Other idiopathic scoliosis, lumbar region: Secondary | ICD-10-CM

## 2017-02-02 DIAGNOSIS — M170 Bilateral primary osteoarthritis of knee: Secondary | ICD-10-CM

## 2017-02-02 DIAGNOSIS — M47816 Spondylosis without myelopathy or radiculopathy, lumbar region: Secondary | ICD-10-CM | POA: Diagnosis not present

## 2017-02-02 DIAGNOSIS — M542 Cervicalgia: Secondary | ICD-10-CM

## 2017-02-02 LAB — CYTOLOGY - PAP
DIAGNOSIS: NEGATIVE
HPV (WINDOPATH): NOT DETECTED

## 2017-02-02 MED ORDER — TIZANIDINE HCL 4 MG PO TABS: 4.0000 mg | ORAL_TABLET | Freq: Every evening | ORAL | 2 refills | Status: DC | PRN

## 2017-02-02 NOTE — Telephone Encounter (Signed)
Nothing in patient's chart that I can see. Please advise. If approved, which knee?

## 2017-02-02 NOTE — Telephone Encounter (Signed)
Looking at [facility],07/28/2016 messages show Euflexxa was applied for and we were waiting for approval. Obi was looking into this

## 2017-02-03 ENCOUNTER — Other Ambulatory Visit: Payer: Self-pay | Admitting: Rheumatology

## 2017-02-06 ENCOUNTER — Telehealth: Payer: Self-pay | Admitting: *Deleted

## 2017-02-06 NOTE — Telephone Encounter (Signed)
-----   Message from Megan Salon, MD sent at 02/06/2017 12:10 PM EDT ----- Please inform pt her thyroid testing was all normal.  Her metabolic panel showed her calcium was mildly increased and her creatinine (kidney function) was mildly elevated.  Not sure if she was fasting the day she came for her visit.  I want her to hydrate well and return for a repeat CMP.  Order placed.  Her CBC was fine.  Her pap was normal and HR HPV was negative.  02 recall.

## 2017-02-06 NOTE — Telephone Encounter (Signed)
Call to patient. Patient notified of all results and verbalized understanding. Patient requests copy of results be mailed to her. Verbal release of records filled out and taken to [facility] for processing. Lab visit scheduled for Tuesday 02/07/17 at 1130. Patient agreeable to date and time of appointment. Patient states she can come to the appointment fasting because due to her fibromyalgia she doesn't wake up early.   Routing to provider for final review. Patient agreeable to disposition. Will close encounter.

## 2017-02-06 NOTE — Addendum Note (Signed)
Addended by: Megan Salon on: 02/06/2017 12:11 PM   Modules accepted: Orders

## 2017-02-07 ENCOUNTER — Telehealth: Payer: Self-pay | Admitting: Radiology

## 2017-02-07 ENCOUNTER — Other Ambulatory Visit: Payer: BLUE CROSS/BLUE SHIELD

## 2017-02-07 DIAGNOSIS — R899 Unspecified abnormal finding in specimens from other organs, systems and tissues: Secondary | ICD-10-CM

## 2017-02-07 LAB — COMPREHENSIVE METABOLIC PANEL
ALBUMIN: 3.9 g/dL (ref 3.6–5.1)
ALK PHOS: 55 U/L (ref 33–130)
ALT: 10 U/L (ref 6–29)
AST: 16 U/L (ref 10–35)
BUN: 15 mg/dL (ref 7–25)
CHLORIDE: 105 mmol/L (ref 98–110)
CO2: 23 mmol/L (ref 20–31)
CREATININE: 0.92 mg/dL (ref 0.50–0.99)
Calcium: 9.5 mg/dL (ref 8.6–10.4)
Glucose, Bld: 85 mg/dL (ref 65–99)
Potassium: 4.6 mmol/L (ref 3.5–5.3)
SODIUM: 139 mmol/L (ref 135–146)
TOTAL PROTEIN: 6.6 g/dL (ref 6.1–8.1)
Total Bilirubin: 0.5 mg/dL (ref 0.2–1.2)

## 2017-02-07 MED ORDER — SODIUM HYALURONATE (VISCOSUP) 20 MG/2ML IX SOSY: PREFILLED_SYRINGE | INTRA_ARTICULAR | 0 refills | Status: DC

## 2017-02-07 NOTE — Telephone Encounter (Signed)
Last Visit: 02/02/17 Next Visit: 08/08/17  Okay to refill Cymbalta?

## 2017-02-07 NOTE — Telephone Encounter (Signed)
ok 

## 2017-02-07 NOTE — Telephone Encounter (Signed)
I called patient to let her know Rx sent in for her for the Euflexxa, her insurance should cover. This was sent in for her to [facility]. Told her they will call her and we will let her know when the medication has arrived   To you FYI

## 2017-02-08 ENCOUNTER — Encounter: Payer: Self-pay | Admitting: *Deleted

## 2017-03-15 ENCOUNTER — Other Ambulatory Visit: Payer: Self-pay | Admitting: Internal Medicine

## 2017-03-15 DIAGNOSIS — Z1231 Encounter for screening mammogram for malignant neoplasm of breast: Secondary | ICD-10-CM

## 2017-03-21 ENCOUNTER — Telehealth: Payer: Self-pay | Admitting: *Deleted

## 2017-03-21 NOTE — Telephone Encounter (Signed)
Left message on machine for patient to call back to schedule injections.

## 2017-03-21 NOTE — Telephone Encounter (Signed)
Euflexxa, patient purchase, bilateral, euflexxa delivered to office 03/21/17, please call patient and schedule Euflexxa inj. X 3 ASAP. Thank you.

## 2017-04-06 ENCOUNTER — Ambulatory Visit
Admission: RE | Admit: 2017-04-06 | Discharge: 2017-04-06 | Disposition: A | Discharge status: Home / Self Care | Payer: BLUE CROSS/BLUE SHIELD | Source: Ambulatory Visit | Attending: Internal Medicine | Admitting: Internal Medicine

## 2017-04-06 DIAGNOSIS — Z1231 Encounter for screening mammogram for malignant neoplasm of breast: Secondary | ICD-10-CM

## 2017-04-06 NOTE — Telephone Encounter (Signed)
Attempted to contact patient to schedule her Euflexxa injections, voicemail is full and unable to leave a message, will try at another time.

## 2017-04-20 ENCOUNTER — Telehealth: Payer: Self-pay | Admitting: Radiology

## 2017-04-20 NOTE — Telephone Encounter (Signed)
Patient received a statement from [facility]  for $1900 for Euflexxa injections Has questions about cost. [phone] or [phone]

## 2017-04-28 NOTE — Telephone Encounter (Signed)
Left a voicemail message for patient to call the office back to schedule her injections and also to r/s her appointment with Mr. Carlyon Shadow.

## 2017-04-28 NOTE — Telephone Encounter (Signed)
I called patient, Euflexxa was delivered to office 03/21/17. Cost should have been discussed with patient before medication was shipped from [facility]. Patient will call to schedule Euflexxa appts after cost has been resolved.

## 2017-05-02 NOTE — Telephone Encounter (Signed)
Attempted to call the patient to schedule her Euflexxa injections.  I am unable to leave a message due to the voice mail is currently full.  Thank you.

## 2017-05-11 ENCOUNTER — Ambulatory Visit: Payer: BLUE CROSS/BLUE SHIELD | Admitting: Obstetrics & Gynecology

## 2017-05-18 NOTE — Telephone Encounter (Signed)
I contacted patient to set up her Euflexxa injections.  She stated that she spoke with you and have found out that her insurance is not going to pay for the injections.  She will call the insurance company and then will call us back to schedule once it is all straighten out.

## 2017-06-12 ENCOUNTER — Telehealth: Payer: Self-pay | Admitting: Rheumatology

## 2017-06-12 ENCOUNTER — Telehealth: Payer: Self-pay | Admitting: *Deleted

## 2017-06-12 NOTE — Telephone Encounter (Signed)
Patients husband calling requesting refill on rx given by Pain Management. Wants RX managed here. RX for Lyrica 75 mg 1 po BID. Patient uses [facility] on [address]. Please call if questions.

## 2017-06-12 NOTE — Telephone Encounter (Signed)
Patient's husband contacted the office to discuss the Eufleexa injection. Patient's husband states he has [employer] and his deductible is $2500. He states they have not met their deductible. Advised him to contact his insurance company as far his out of pocket cost for the Euflexxa injections. It may all be applied towards his deductible an they would be able to tell him that information,. He was also asking about cost of having the Euflexxa injected and advised him he would need to contact billing again or that information and to ask his insurance about the coverage. He verbalized understanding.

## 2017-06-12 NOTE — Telephone Encounter (Signed)
Advised Rob that Lyrica should come from PCP or pain management. He verbalized understanding.

## 2017-06-12 NOTE — Telephone Encounter (Signed)
Lyrica should come from pain management or through her PCP.

## 2017-07-17 ENCOUNTER — Telehealth: Payer: Self-pay | Admitting: Obstetrics & Gynecology

## 2017-07-17 NOTE — Telephone Encounter (Signed)
Patient is having some pain on her left side for about a month.

## 2017-07-17 NOTE — Telephone Encounter (Signed)
Unable to leave message,mailbox full. 

## 2017-07-18 NOTE — Telephone Encounter (Signed)
Unable to leave message, mailbox full, unable to accept messages.

## 2017-07-18 NOTE — Telephone Encounter (Signed)
I do know her very well but I don't think this is gyn related especially with both ovaries being gone.  I do think PCP is the better place to start for evaluation but if she insists, I am happy to see her with the knowledge that I might send her to PCP.  Thanks.

## 2017-07-18 NOTE — Telephone Encounter (Signed)
Spoke with patient. Patient will contact PCP for appointment for further evlauation. Patient states she plans to return call to office if unable to schedule with PCP. Patient aware she may be sent back to PCP for evaluation. Patient verbalizes understanding and is agreeable.  Patient is agreeable to disposition. Will close encounter.

## 2017-07-18 NOTE — Telephone Encounter (Signed)
Patient returning your call.

## 2017-07-18 NOTE — Telephone Encounter (Signed)
Spoke with patient. Reports intermittent left sided abdominal pain that started 1 month ago. Reports as a "sensation, itchy pain, need to rub it", 2/10 today. Has history of chronic back pain and fibromyalgia, states this is different. Takes percocet and zanaflex for pain.   Denies vaginal bleeding/discharge, pelvic pain, urinary complaints, N/V, fever/chills.   Hx of laparoscopic BSO 05/13/14.  Last BM 9/10, goes from constipation to soft stools. BM q1-2 days. Abdomen soft. Noticed blood in stool when straining several months ago. States seen by Dr. Collene Mares in 2015.  Last AEX 01/31/17  Recommended f/u with PCP for further evaluation. Patient states she has not seen PCP recently, felt like Dr. Sabra Heck knows her best. Advised will review with Dr. Sabra Heck and return call with any additional recommendations, patient is agreeable.  Dr. Sabra Heck -any additional recommendations?

## 2017-07-25 DIAGNOSIS — M170 Bilateral primary osteoarthritis of knee: Secondary | ICD-10-CM | POA: Insufficient documentation

## 2017-07-25 DIAGNOSIS — Z13820 Encounter for screening for osteoporosis: Secondary | ICD-10-CM | POA: Insufficient documentation

## 2017-07-25 DIAGNOSIS — M5136 Other intervertebral disc degeneration, lumbar region: Secondary | ICD-10-CM | POA: Insufficient documentation

## 2017-07-25 NOTE — Progress Notes (Deleted)
Office Visit Note  Patient: Courtney Waters             Date of Birth: 04/14/1956           MRN: [id number]             PCP: Leanna Battles, MD Referring: Leanna Battles, MD Visit Date: 08/08/2017 Occupation: @GUAROCC @    Subjective:  No chief complaint on file.   History of Present Illness: Courtney Waters is a 61 y.o. female ***   Activities of Daily Living:  Patient reports morning stiffness for *** {minute/hour:19697}.   Patient {ACTIONS;DENIES/REPORTS:21021675::"Denies"} nocturnal pain.  Difficulty dressing/grooming: {ACTIONS;DENIES/REPORTS:21021675::"Denies"} Difficulty climbing stairs: {ACTIONS;DENIES/REPORTS:21021675::"Denies"} Difficulty getting out of chair: {ACTIONS;DENIES/REPORTS:21021675::"Denies"} Difficulty using hands for taps, buttons, cutlery, and/or writing: {ACTIONS;DENIES/REPORTS:21021675::"Denies"}   No Rheumatology ROS completed.   PMFS History:  Patient Active Problem List   Diagnosis Date Noted  . Primary osteoarthritis of both knees 07/25/2017  . Screening for osteoporosis 07/25/2017  . DDD (degenerative disc disease), lumbar 07/25/2017  . Spondylosis of lumbar region without myelopathy or radiculopathy 01/26/2017  . Primary osteoarthritis of both hands 01/26/2017  . Primary osteoarthritis of both feet 01/26/2017  . Other idiopathic scoliosis, lumbar region 01/26/2017  . Fibromyalgia 02/05/2015  . METATARSALGIA 08/14/2007  . BUNIONS, BILATERAL 08/14/2007  . UNEQUAL LEG LENGTH, ACQUIRED 08/14/2007  . HEEL PAIN, LEFT 08/08/2007  . Osteopenia 08/08/2007    Past Medical History:  Diagnosis Date  . Arthritis    hands and neck  . Back pain 12/01/1998   compression fracture L1 from sledding accident  . Depression   . Fibromyalgia   . Hearing loss in right ear   . MVP (mitral valve prolapse)    Hx  . SVD (spontaneous vaginal delivery)    x 2    Family History  Problem Relation Age of Onset  . Diabetes Mother   . Hearing loss Mother     . Cancer Father        GI  . Heart disease Sister   . Hearing loss Brother   . Breast cancer Maternal Aunt   . Ovarian cancer Other        MGM, mid 27's   Past Surgical History:  Procedure Laterality Date  . BUNIONECTOMY Left 10/16/14   Dr. Durward Fortes  . COLONOSCOPY    . 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 LAPAROSCOPIC BILATERAL SALPINGO OOPHERECTOMY Bilateral 05/13/2014   Procedure: LAPAROSCOPIC BILATERAL SALPINGO OOPHORECTOMY;  Surgeon: Lyman Speller, MD;  Location: [facility];  Service: Gynecology;  Laterality: Bilateral;  . TONSILLECTOMY  age 3  . WISDOM TOOTH EXTRACTION  age 52   Social History   Social History Narrative  . No narrative on file     Objective: Vital Signs: LMP 11/07/2009 Comment: spotting x 2days   Physical Exam   Musculoskeletal Exam: ***  CDAI Exam: No CDAI exam completed.    Investigation: No additional findings.   Imaging: No results found.  Speciality Comments: No specialty comments available.    Procedures:  No procedures performed Allergies: Patient has no known allergies.   Assessment / Plan:     Visit Diagnoses: Fibromyalgia  Other fatigue  Primary osteoarthritis of both hands  Primary osteoarthritis of both knees  Primary osteoarthritis of both feet  Other idiopathic scoliosis, lumbar region  DDD (degenerative disc disease), lumbar  Screening for osteoporosis - DEXA PCP 2014 was  normal     Orders: No orders of the defined types were placed in this encounter.  No orders of the defined types were placed in this encounter.   Face-to-face time spent with patient was *** minutes. 50% of time was spent in counseling and coordination of care.  Follow-Up Instructions: No Follow-up on file.   Amy Littrell, RT  Note - This record has been created using [employer].  Chart creation errors have been sought, but may not  always  have been located. Such creation errors do not reflect on  the standard of medical care.

## 2017-08-06 NOTE — Progress Notes (Signed)
Office Visit Note  Patient: Courtney Waters             Date of Birth: Aug 05, 1956           MRN: [id number]             PCP: Leanna Battles, MD Referring: Leanna Battles, MD Visit Date: 08/08/2017 Occupation: @GUAROCC @    Subjective:  Generalized pain.   History of Present Illness: Courtney Waters is a 61 y.o. female with h/o FMS , OA and DDD lumbar spine. She tried Lyrica and April 2018. She had some response to Lyrica as regards to pain management but eventually stopped working. She also noticed some weight gain on Lyrica and she discontinued Lyrica after tapering off the medications in June. She's been going to a chiropractor since May 2018 which she founds helpful. She continues to have discomfort in her hands, bilateral knees, trochanteric area. She also reports neck and lower back pain.  Activities of Daily Living:  Patient reports morning stiffness for 1 hour.   Patient Denies nocturnal pain.  Difficulty dressing/grooming: Denies Difficulty climbing stairs: Reports Difficulty getting out of chair: Reports Difficulty using hands for taps, buttons, cutlery, and/or writing: Denies   Review of Systems  Constitutional: Positive for fatigue. Negative for night sweats, weight gain, weight loss and weakness.  HENT: Positive for mouth dryness. Negative for mouth sores, trouble swallowing, trouble swallowing and nose dryness.   Eyes: Positive for dryness. Negative for pain, redness and visual disturbance.  Respiratory: Negative.  Negative for cough, shortness of breath and difficulty breathing.   Cardiovascular: Negative.  Negative for chest pain, palpitations, hypertension, irregular heartbeat and swelling in legs/feet.  Gastrointestinal: Positive for constipation. Negative for blood in stool and diarrhea.  Endocrine: Negative for increased urination.  Genitourinary: Negative for vaginal dryness.  Musculoskeletal: Positive for arthralgias, joint pain, morning stiffness and muscle  tenderness. Negative for joint swelling, myalgias, muscle weakness and myalgias.  Skin: Negative.  Negative for color change, rash, hair loss, skin tightness, ulcers and sensitivity to sunlight.  Allergic/Immunologic: Negative for susceptible to infections.  Neurological: Negative.  Negative for dizziness, numbness, headaches, memory loss and night sweats.  Hematological: Negative for swollen glands.  Psychiatric/Behavioral: Negative.  Negative for depressed mood and sleep disturbance. The patient is not nervous/anxious.     PMFS History:  Patient Active Problem List   Diagnosis Date Noted  . Primary osteoarthritis of both knees 07/25/2017  . Screening for osteoporosis 07/25/2017  . DDD (degenerative disc disease), lumbar 07/25/2017  . Primary osteoarthritis of both hands 01/26/2017  . Primary osteoarthritis of both feet 01/26/2017  . Other idiopathic scoliosis, lumbar region 01/26/2017  . Fibromyalgia 02/05/2015  . METATARSALGIA 08/14/2007  . BUNIONS, BILATERAL 08/14/2007  . UNEQUAL LEG LENGTH, ACQUIRED 08/14/2007  . HEEL PAIN, LEFT 08/08/2007  . Osteopenia 08/08/2007    Past Medical History:  Diagnosis Date  . Arthritis    hands and neck  . Back pain 12/01/1998   compression fracture L1 from sledding accident  . Depression   . Fibromyalgia   . Hearing loss in right ear   . MVP (mitral valve prolapse)    Hx  . SVD (spontaneous vaginal delivery)    x 2    Family History  Problem Relation Age of Onset  . Diabetes Mother   . Hearing loss Mother   . Cancer Father        GI  . Heart disease Sister   . Hearing loss Brother   .  Breast cancer Maternal Aunt   . Ovarian cancer Other        MGM, mid 75's   Past Surgical History:  Procedure Laterality Date  . BUNIONECTOMY Left 10/16/14   Dr. Durward Fortes  . COLONOSCOPY    . 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 LAPAROSCOPIC BILATERAL  SALPINGO OOPHERECTOMY Bilateral 05/13/2014   Procedure: LAPAROSCOPIC BILATERAL SALPINGO OOPHORECTOMY;  Surgeon: Lyman Speller, MD;  Location: [facility];  Service: Gynecology;  Laterality: Bilateral;  . TONSILLECTOMY  age 30  . WISDOM TOOTH EXTRACTION  age 1   Social History   Social History Narrative  . No narrative on file     Objective: Vital Signs: BP 119/68 (BP Location: Left Arm, Patient Position: Sitting, Cuff Size: Normal)   Pulse 79   Ht 5\' 7"  (1.702 m)   Wt 155 lb (70.3 kg)   LMP 11/07/2009 Comment: spotting x 2days  BMI 24.28 kg/m    Physical Exam  Constitutional: She is oriented to person, place, and time. She appears well-developed and well-nourished.  HENT:  Head: Normocephalic and atraumatic.  Eyes: Conjunctivae and EOM are normal.  Neck: Normal range of motion.  Cardiovascular: Normal rate, regular rhythm, normal heart sounds and intact distal pulses.   Pulmonary/Chest: Effort normal and breath sounds normal.  Abdominal: Soft. Bowel sounds are normal.  Lymphadenopathy:    She has no cervical adenopathy.  Neurological: She is alert and oriented to person, place, and time.  Skin: Skin is warm and dry. Capillary refill takes less than 2 seconds.  Psychiatric: She has a normal mood and affect. Her behavior is normal.  Nursing note and vitals reviewed.    Musculoskeletal Exam: C-spine some stiffness with range of motion. She has lumbar scoliosis and discomfort range of motion of her lumbar spine. She has DIP PIP thickening in her hands and feet consistent with osteoarthritis. She is some crepitus in her knee joints with discomfort. There is no warmth swelling or effusion noted in her knee joints. She's generalized hyperalgesia due to fibromyalgia.  CDAI Exam: No CDAI exam completed.    Investigation: No additional findings.   Imaging: No results found.  Speciality Comments: No specialty comments available.    Procedures:  No procedures  performed Allergies: Patient has no known allergies.   Assessment / Plan:     Visit Diagnoses: Fibromyalgia - On Cymbalta 60 mg by mouth daily, Zanaflex 4 mg po q hs. She continues with some discomfort. She will continue current regimen for right now. She states she had some labs done by her PCP which she will forward to [place].  Primary osteoarthritis of both hands: Joint protection and muscle strengthening discussed.  Primary osteoarthritis of both feet: Proper fitting shoes were discussed.  Primary osteoarthritis of both knees -  She is awaiting to get her Visco supplement injections.  DDD (degenerative disc disease), lumbar [place] chronic pain she's been going to a chiropractor which is been helpful. Weight loss diet and exercise was discussed.  Other idiopathic scoliosis, lumbar region  Other chronic pain - On oxycodone by pain clinic    Orders: No orders of the defined types were placed in this encounter.  No orders of the defined types were placed in this encounter.   Face-to-face time spent with patient was 96minutes. Greater than 50% of time was spent in counseling and coordination of care.  Follow-Up Instructions: No Follow-up  on file.   Bo Merino, MD  Note - This record has been created using [employer], commissioning.  Chart creation errors have been sought, but may not always  have been located. Such creation errors do not reflect on  the standard of medical care.

## 2017-08-08 ENCOUNTER — Encounter: Payer: Self-pay | Admitting: Rheumatology

## 2017-08-08 ENCOUNTER — Ambulatory Visit: Payer: BLUE CROSS/BLUE SHIELD | Admitting: Rheumatology

## 2017-08-08 ENCOUNTER — Ambulatory Visit (INDEPENDENT_AMBULATORY_CARE_PROVIDER_SITE_OTHER): Payer: BLUE CROSS/BLUE SHIELD | Admitting: Rheumatology

## 2017-08-08 VITALS — BP 119/68 | HR 79 | Ht 67.0 in | Wt 155.0 lb

## 2017-08-08 DIAGNOSIS — M170 Bilateral primary osteoarthritis of knee: Secondary | ICD-10-CM | POA: Diagnosis not present

## 2017-08-08 DIAGNOSIS — M4126 Other idiopathic scoliosis, lumbar region: Secondary | ICD-10-CM | POA: Diagnosis not present

## 2017-08-08 DIAGNOSIS — M19041 Primary osteoarthritis, right hand: Secondary | ICD-10-CM | POA: Diagnosis not present

## 2017-08-08 DIAGNOSIS — M5136 Other intervertebral disc degeneration, lumbar region: Secondary | ICD-10-CM

## 2017-08-08 DIAGNOSIS — M51369 Other intervertebral disc degeneration, lumbar region without mention of lumbar back pain or lower extremity pain: Secondary | ICD-10-CM

## 2017-08-08 DIAGNOSIS — M797 Fibromyalgia: Secondary | ICD-10-CM

## 2017-08-08 DIAGNOSIS — M19042 Primary osteoarthritis, left hand: Secondary | ICD-10-CM

## 2017-08-08 DIAGNOSIS — G8929 Other chronic pain: Secondary | ICD-10-CM | POA: Diagnosis not present

## 2017-08-08 DIAGNOSIS — M19072 Primary osteoarthritis, left ankle and foot: Secondary | ICD-10-CM

## 2017-08-08 DIAGNOSIS — M19071 Primary osteoarthritis, right ankle and foot: Secondary | ICD-10-CM | POA: Diagnosis not present

## 2017-08-08 MED ORDER — DULOXETINE HCL 60 MG PO CPEP: 60.0000 mg | ORAL_CAPSULE | Freq: Every day | ORAL | 1 refills | Status: DC

## 2017-08-08 NOTE — Addendum Note (Signed)
Addended by: Carole Binning on: 08/08/2017 03:51 PM   Modules accepted: Orders

## 2017-08-08 NOTE — Patient Instructions (Signed)
Natural anti-inflammatories  You can purchase these at [employer], [employer] or online.  . Turmeric (capsules)  . Ginger (ginger root or capsules)  . Omega 3 (Fish, flax seeds, chia seeds, walnuts, almonds)  . Tart cherry (dried or extract)   Patient should be under the care of a physician while taking these supplements. This may not be reproduced without the permission of Dr. Linah Klapper.  

## 2017-11-07 DIAGNOSIS — K449 Diaphragmatic hernia without obstruction or gangrene: Secondary | ICD-10-CM

## 2017-11-07 HISTORY — DX: Diaphragmatic hernia without obstruction or gangrene: K44.9

## 2017-11-15 ENCOUNTER — Other Ambulatory Visit: Payer: Self-pay | Admitting: Internal Medicine

## 2017-11-20 ENCOUNTER — Other Ambulatory Visit: Payer: Self-pay | Admitting: Internal Medicine

## 2017-11-20 DIAGNOSIS — R12 Heartburn: Secondary | ICD-10-CM

## 2017-11-23 ENCOUNTER — Ambulatory Visit
Admission: RE | Admit: 2017-11-23 | Discharge: 2017-11-23 | Disposition: A | Discharge status: Home / Self Care | Payer: BLUE CROSS/BLUE SHIELD | Source: Ambulatory Visit | Attending: Internal Medicine | Admitting: Internal Medicine

## 2017-11-23 DIAGNOSIS — R12 Heartburn: Secondary | ICD-10-CM

## 2017-11-28 ENCOUNTER — Telehealth: Payer: Self-pay | Admitting: *Deleted

## 2017-11-28 NOTE — Telephone Encounter (Signed)
I called patient, Euflexxa delivered to office in May, 2018. Patient husband states the Euflexxa costs $1900 and they want to ship it back to the pharmacy. Euflexxa is expiring in 03/2018. Patient will call to have Euflexxa shipped back to pharmacy.

## 2018-01-18 ENCOUNTER — Telehealth: Payer: Self-pay | Admitting: *Deleted

## 2018-01-18 NOTE — Telephone Encounter (Signed)
LMOM, patient has purchased Euflexxa, expiring 03/12/18, patient needs to schedule appts or pick up Euflexxa to return to pharmacy?

## 2018-01-24 NOTE — Progress Notes (Signed)
Office Visit Note  Patient: Courtney Waters             Date of Birth: Apr 02, 1956           MRN: [id number]             PCP: Leanna Battles, MD Referring: Leanna Battles, MD Visit Date: 02/06/2018 Occupation: @GUAROCC @    Subjective:  Fatigue.   History of Present Illness: Courtney Waters is a 62 y.o. female with history of fibromyalgia and osteoarthritis.  She states she has been having pain and discomfort all over her body.  She has discomfort in the right arm at the deltoid insertion.  She also continues to have some stiffness and pain in her hands and feet due to osteoarthritis.  She is osteoarthritis in her knee joints which causes discomfort as well.  She states despite sleeping for 8 hours she never feels rested.  She is also noticed some blood in her stool.  She has been going for water aerobics 3 times a week.  Activities of Daily Living:  Patient reports morning stiffness for 10 minutes.   Patient Denies nocturnal pain.  Difficulty dressing/grooming: Denies Difficulty climbing stairs: Denies Difficulty getting out of chair: Denies Difficulty using hands for taps, buttons, cutlery, and/or writing: Denies   Review of Systems  Constitutional: Positive for fatigue. Negative for fever, night sweats, weight gain and weight loss.  HENT: Negative for ear pain, mouth sores, trouble swallowing, trouble swallowing, mouth dryness and nose dryness.   Eyes: Negative for pain, redness, visual disturbance and dryness.  Respiratory: Negative for cough, shortness of breath and difficulty breathing.   Cardiovascular: Negative for chest pain, palpitations, hypertension, irregular heartbeat and swelling in legs/feet.  Gastrointestinal: Positive for constipation. Negative for blood in stool and diarrhea.  Endocrine: Negative for increased urination.  Genitourinary: Negative for difficulty urinating and vaginal dryness.  Musculoskeletal: Positive for arthralgias, joint pain, myalgias,  muscle weakness and myalgias. Negative for joint swelling, morning stiffness and muscle tenderness.  Skin: Negative for color change, rash, hair loss, skin tightness, ulcers and sensitivity to sunlight.  Allergic/Immunologic: Negative for susceptible to infections.  Neurological: Negative for dizziness, light-headedness, numbness, headaches, memory loss, night sweats and weakness.  Hematological: Negative for bruising/bleeding tendency and swollen glands.  Psychiatric/Behavioral: Negative for depressed mood and sleep disturbance. The patient is not nervous/anxious.     PMFS History:  Patient Active Problem List   Diagnosis Date Noted  . Primary osteoarthritis of both knees 07/25/2017  . Screening for osteoporosis 07/25/2017  . DDD (degenerative disc disease), lumbar 07/25/2017  . Primary osteoarthritis of both hands 01/26/2017  . Primary osteoarthritis of both feet 01/26/2017  . Other idiopathic scoliosis, lumbar region 01/26/2017  . Fibromyalgia 02/05/2015  . METATARSALGIA 08/14/2007  . BUNIONS, BILATERAL 08/14/2007  . UNEQUAL LEG LENGTH, ACQUIRED 08/14/2007  . HEEL PAIN, LEFT 08/08/2007  . Osteopenia 08/08/2007    Past Medical History:  Diagnosis Date  . Arthritis    hands and neck  . Back pain 12/01/1998   compression fracture L1 from sledding accident  . Depression   . Fibromyalgia   . Hearing loss in right ear   . MVP (mitral valve prolapse)    Hx  . SVD (spontaneous vaginal delivery)    x 2    Family History  Problem Relation Age of Onset  . Diabetes Mother   . Hearing loss Mother   . Cancer Father        GI  .  Heart disease Sister   . Hearing loss Brother   . Breast cancer Maternal Aunt   . Ovarian cancer Other        MGM, mid 27's   Past Surgical History:  Procedure Laterality Date  . BUNIONECTOMY Left 10/16/14   Dr. Durward Fortes  . COLONOSCOPY    . EXTERNAL EAR SURGERY  1959  with several surgeries   reconstruction and making of right external ear from a  birth defect  . LAPAROSCOPIC BILATERAL SALPINGO OOPHERECTOMY Bilateral 05/13/2014   Procedure: LAPAROSCOPIC BILATERAL SALPINGO OOPHORECTOMY;  Surgeon: Lyman Speller, MD;  Location: [facility];  Service: Gynecology;  Laterality: Bilateral;  . TONSILLECTOMY  age 62  . WISDOM TOOTH EXTRACTION  age 89   Social History   Social History Narrative  . Not on file     Objective: Vital Signs: BP 124/76 (BP Location: Right Arm, Patient Position: Sitting, Cuff Size: Normal)   Pulse 86   Ht 5\' 7"  (1.702 m)   Wt 145 lb (65.8 kg)   LMP 11/07/2009 Comment: spotting x 2days  BMI 22.71 kg/m    Physical Exam  Constitutional: She is oriented to person, place, and time. She appears well-developed and well-nourished.  HENT:  Head: Normocephalic and atraumatic.  Eyes: Conjunctivae and EOM are normal.  Neck: Normal range of motion.  Cardiovascular: Normal rate, regular rhythm, normal heart sounds and intact distal pulses.  Pulmonary/Chest: Effort normal and breath sounds normal.  Abdominal: Soft. Bowel sounds are normal.  Lymphadenopathy:    She has no cervical adenopathy.  Neurological: She is alert and oriented to person, place, and time.  Skin: Skin is warm and dry. Capillary refill takes less than 2 seconds.  Psychiatric: She has a normal mood and affect. Her behavior is normal.  Nursing note and vitals reviewed.    Musculoskeletal Exam: C-spine good range of motion.  She has some postural kyphosis.  Lumbar spine good range of motion with some discomfort.  Shoulder joints elbow joints wrist joints are good range of motion.  She has DIP PIP and CMC thickening bilaterally.  Hip joints knee joints ankles MTPs were in good range of motion with no synovitis.  She has some hyperalgesia.  CDAI Exam: No CDAI exam completed.    Investigation: No additional findings.   Imaging: No results found.  Speciality Comments: No specialty comments available.    Procedures:  No procedures  performed Allergies: Patient has no known allergies.   Assessment / Plan:     Visit Diagnoses: Fibromyalgia -she continues to have generalized pain discomfort and positive tender points.  We had detailed discussion regarding regular exercise muscle strengthening.  Some of the exercises were demonstrated in the office.  She is on  Cymbalta 60 mg by mouth daily, Zanaflex 4 mg po q hs.  Primary osteoarthritis of both hands: Joint protection muscle strengthening was discussed.  Primary osteoarthritis of both feet: She has been using proper fitting shoes.  Primary osteoarthritis of both knees: She has ongoing discomfort in her knee joints.  She is waiting on approval for Visco supplement injections from her insurance.  DDD (degenerative disc disease), lumbar chronic pain  Other chronic pain - On oxycodone by pain clinic.  Patient states that she takes oxycodone only on as needed basis.  Fatigue: We had detailed discussion regarding daily exercise.  Good sleep hygiene was discussed at length.  I also discussed  if she does not notice any improvement then we can schedule a sleep study.  Other idiopathic scoliosis, lumbar region  Osteopenia of multiple sites on her DEXA has been monitored by her PCP.  Rectal bleeding: I have advised the patient to schedule an appointment with GI.  History of gastroesophageal reflux (GERD): Recent diagnosis.   Orders: No orders of the defined types were placed in this encounter.  No orders of the defined types were placed in this encounter.   Face-to-face time spent with patient was 40 minutes.  Greater than 50% of time was spent in counseling and coordination of care.  Follow-Up Instructions: Return in about 6 months (around 08/08/2018) for FMS OA.   Bo Merino, MD  Note - This record has been created using [employer].  Chart creation errors have been sought, but may not always  have been located. Such creation errors do not reflect on   the standard of medical care.

## 2018-01-26 ENCOUNTER — Telehealth: Payer: Self-pay | Admitting: Rheumatology

## 2018-01-26 NOTE — Telephone Encounter (Signed)
Patient's husband Herbie Baltimore called and requested to speak with you directly regarding his wife's injections.  He will be home this afternoon and requests that you call him on either his home phone [phone] or cell [phone]

## 2018-01-29 NOTE — Telephone Encounter (Signed)
I called patient's husband, Herbie Baltimore. He wanted me to call the pharmacy to ask for the Euflexxa x 2 boxes to be shipped back to [facility] with a credit. I called [facility] who stated the Euflexxa was non-refundable. [facility] stated that the patient needed to call and ask to speak with a supervisor to escalate the dispute. I relayed the message to the patient's husband. They will advise. Euflexxa expires in May, 2019 , patient is aware.

## 2018-02-05 ENCOUNTER — Other Ambulatory Visit: Payer: Self-pay | Admitting: Rheumatology

## 2018-02-05 NOTE — Telephone Encounter (Signed)
Last visit: 08/08/17 Next Visit: 02/06/18  Okay to refill per Dr. Estanislado Pandy

## 2018-02-06 ENCOUNTER — Encounter: Payer: Self-pay | Admitting: Rheumatology

## 2018-02-06 ENCOUNTER — Ambulatory Visit: Payer: BLUE CROSS/BLUE SHIELD | Admitting: Rheumatology

## 2018-02-06 VITALS — BP 124/76 | HR 86 | Ht 67.0 in | Wt 145.0 lb

## 2018-02-06 DIAGNOSIS — M170 Bilateral primary osteoarthritis of knee: Secondary | ICD-10-CM

## 2018-02-06 DIAGNOSIS — R5383 Other fatigue: Secondary | ICD-10-CM | POA: Diagnosis not present

## 2018-02-06 DIAGNOSIS — M797 Fibromyalgia: Secondary | ICD-10-CM

## 2018-02-06 DIAGNOSIS — M8589 Other specified disorders of bone density and structure, multiple sites: Secondary | ICD-10-CM

## 2018-02-06 DIAGNOSIS — M19042 Primary osteoarthritis, left hand: Secondary | ICD-10-CM

## 2018-02-06 DIAGNOSIS — Z8719 Personal history of other diseases of the digestive system: Secondary | ICD-10-CM | POA: Diagnosis not present

## 2018-02-06 DIAGNOSIS — M19071 Primary osteoarthritis, right ankle and foot: Secondary | ICD-10-CM

## 2018-02-06 DIAGNOSIS — G8929 Other chronic pain: Secondary | ICD-10-CM | POA: Diagnosis not present

## 2018-02-06 DIAGNOSIS — M4126 Other idiopathic scoliosis, lumbar region: Secondary | ICD-10-CM

## 2018-02-06 DIAGNOSIS — M5136 Other intervertebral disc degeneration, lumbar region: Secondary | ICD-10-CM

## 2018-02-06 DIAGNOSIS — K625 Hemorrhage of anus and rectum: Secondary | ICD-10-CM

## 2018-02-06 DIAGNOSIS — M19041 Primary osteoarthritis, right hand: Secondary | ICD-10-CM | POA: Diagnosis not present

## 2018-02-06 DIAGNOSIS — M19072 Primary osteoarthritis, left ankle and foot: Secondary | ICD-10-CM

## 2018-02-21 ENCOUNTER — Ambulatory Visit: Payer: BLUE CROSS/BLUE SHIELD | Admitting: Obstetrics & Gynecology

## 2018-02-21 ENCOUNTER — Encounter: Payer: Self-pay | Admitting: Obstetrics & Gynecology

## 2018-02-21 VITALS — BP 132/80 | HR 92 | Ht 66.75 in | Wt 152.0 lb

## 2018-02-21 DIAGNOSIS — Z01419 Encounter for gynecological examination (general) (routine) without abnormal findings: Secondary | ICD-10-CM

## 2018-02-21 NOTE — Progress Notes (Signed)
62 y.o. [id number] MarriedCaucasianF here for annual exam.  Doing well except for chronic fatigue.   Sees Dr. Estanislado Pandy.  Has been suggested to have a sleep study.  She has been considering this.  D/w pt today.    Denies vaginal bleeding.  Had some bleeding with bowel movement once a month for a few months.  Has been decreasing her Percocet to prn use.  Feels since using this prn, has not had any bleeding.  Constipation has improved.   Having increased issues with reflux.    Patient's last menstrual period was 11/07/2009.          Sexually active: Yes.    The current method of family planning is post menopausal status.    Exercising: Yes.    swimming Smoker:  no  Health Maintenance: Pap:  01/31/17 Neg. HR HPV:neg   01/19/15 Neg  History of abnormal Pap:  no MMG:  04/06/17 BIRADS1:Neg  Colonoscopy:  07/07/14 normal. BMD:   01/17/13 Normal TDaP:  2018 Pneumonia vaccine(s):  no Shingrix: no.  Pt planning on getting this. Hep C testing: 01/25/16 Neg  Screening Labs: PCP, Hb today: PCP   reports that she has never smoked. She has never used smokeless tobacco. She reports that she drinks about 0.6 - 1.2 oz of alcohol per week. She reports that she does not use drugs.  Past Medical History:  Diagnosis Date  . Acid reflux   . Arthritis    hands and neck  . Back pain 12/01/1998   compression fracture L1 from sledding accident  . Depression   . Fibromyalgia   . Hearing loss in right ear   . Hiatal hernia 11/2017  . MVP (mitral valve prolapse)    Hx  . SVD (spontaneous vaginal delivery)    x 2    Past Surgical History:  Procedure Laterality Date  . BUNIONECTOMY Left 10/16/14   Dr. Durward Fortes  . COLONOSCOPY    . 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 LAPAROSCOPIC BILATERAL SALPINGO OOPHERECTOMY Bilateral 05/13/2014   Procedure: LAPAROSCOPIC BILATERAL SALPINGO OOPHORECTOMY;  Surgeon: Lyman Speller,  MD;  Location: [facility];  Service: Gynecology;  Laterality: Bilateral;  . TONSILLECTOMY  age 78  . WISDOM TOOTH EXTRACTION  age 56    Current Outpatient Medications  Medication Sig Dispense Refill  . Biotin 5000 MCG CAPS Take 1 capsule by mouth daily.    Marland Kitchen CALCIUM PO Take by mouth. With magnesium and zinc    . Cholecalciferol (VITAMIN D) 2000 UNITS CAPS Take 1 capsule by mouth daily. 5,000 IU    . DULoxetine (CYMBALTA) 60 MG capsule TAKE 1 CAPSULE BY MOUTH EVERY DAY 90 capsule 1  . Magnesium 250 MG TABS Take by mouth.    . NON FORMULARY Reported on 01/25/2016    . omeprazole (PRILOSEC) 20 MG capsule Take 20 mg by mouth daily.    Marland Kitchen oxyCODONE-acetaminophen (PERCOCET/ROXICET) 5-325 MG per tablet Take 1-2 tablets by mouth every 4 (four) hours as needed for moderate pain. 30 tablet 0   No current facility-administered medications for this visit.     Family History  Problem Relation Age of Onset  . Diabetes Mother   . Hearing loss Mother   . Cancer Father        GI  . Heart disease Sister   . Hearing loss Brother   . Breast cancer Maternal Aunt   .  Ovarian cancer Other        MGM, mid 74's    Review of Systems  Constitutional: Negative.   HENT: Positive for hearing loss.   Eyes: Negative.   Respiratory: Negative.   Cardiovascular: Negative.   Gastrointestinal:       Bloating  Genitourinary: Negative.   Musculoskeletal: Positive for myalgias.  Skin: Negative.   Neurological: Negative.   Endo/Heme/Allergies: Negative.   Psychiatric/Behavioral: Negative.     Exam:   BP: 132/80;  R: 16; P: 92; Weight: 152, Ht: 5' 6 3/4"  General appearance: alert, cooperative and appears stated age Head: Normocephalic, without obvious abnormality, atraumatic Neck: no adenopathy, supple, symmetrical, trachea midline and thyroid normal to inspection and palpation Lungs: clear to auscultation bilaterally Breasts: normal appearance, no masses or tenderness Heart: regular rate and  rhythm Abdomen: soft, non-tender; bowel sounds normal; no masses,  no organomegaly Extremities: extremities normal, atraumatic, no cyanosis or edema Skin: Skin color, texture, turgor normal. No rashes or lesions Lymph nodes: Cervical, supraclavicular, and axillary nodes normal. No abnormal inguinal nodes palpated Neurologic: Grossly normal   Pelvic: External genitalia:  no lesions              Urethra:  normal appearing urethra with no masses, tenderness or lesions              Bartholins and Skenes: normal                 Vagina: normal appearing vagina with normal color and discharge, no lesions              Cervix: no lesions              Pap taken: No. Bimanual Exam:  Uterus:  normal size, contour, position, consistency, mobility, non-tender              Adnexa: normal adnexa and no mass, fullness, tenderness               Rectovaginal: Confirms               Anus:  normal sphincter tone, no lesions  Chaperone was present for exam.  A:  Well Woman with normal exam PMP, no HRT Fibromyalgia  Chronic back pain followed by [facility] S/p laparoscopic BSO 7/15  P:   Mammogram guidelines reviewed.   Pap and HR HPV neg 2018.  Not indicated. Lab work is UTD Aware of Shingrix vaccine.  She is planning on doing this. Plan to repeat BMD with MMG. Return annually or prn

## 2018-03-07 HISTORY — PX: OTHER SURGICAL HISTORY: SHX169

## 2018-03-15 ENCOUNTER — Ambulatory Visit: Payer: BLUE CROSS/BLUE SHIELD | Admitting: Podiatry

## 2018-03-15 ENCOUNTER — Encounter: Payer: Self-pay | Admitting: Podiatry

## 2018-03-15 DIAGNOSIS — L600 Ingrowing nail: Secondary | ICD-10-CM | POA: Diagnosis not present

## 2018-03-15 NOTE — Progress Notes (Signed)
Subjective:   Patient ID: Courtney Waters, female   DOB: 62 y.o.   MRN: [id number]   HPI Patient presents stating she has a very elongated thick nail left that is tender when pressed and make shoe gear difficult.  Patient states this is been going on for about a year and she does not remember specific injury had slight concerned also about the third.  Patient does not smoke and likes to be active   Review of Systems  All other systems reviewed and are negative.       Objective:  Physical Exam  Constitutional: She appears well-developed and well-nourished.  Cardiovascular: Intact distal pulses.  Pulmonary/Chest: Effort normal.  Musculoskeletal: Normal range of motion.  Neurological: She is alert.  Skin: Skin is warm.  Nursing note and vitals reviewed.   Neurovascular status found to be intact muscle strength is adequate with patient noted to have severely thickened elongated fifth nail left that is painful when pressed and mild elongation third nail left.  Patient is noted to have good digital perfusion and is well oriented x3     Assessment:  Severely damaged thickened fifth nail left is painful with third nail left is slightly elongated but not painful but not near as much deformity noted     Plan:  H&P and reviewed condition.  At this point due to the abnormal thickness and dystrophic changes of the fifth nail I recommended removal and that should be done permanently.  I educated her and caregiver on the process and they want procedure understanding risk and signed consent form.  I infiltrated the left fifth digit 60 mg Xylocaine Marcaine mixture remove the fifth nail exposed matrix and applied phenol 5 applications 30 seconds followed by alcohol lavage and sterile dressing.  Gave instructions on soaks and this was all done with sterile instrumentation under sterile technique.  Patient will be seen back for reevaluation

## 2018-03-15 NOTE — Patient Instructions (Signed)

## 2018-03-23 ENCOUNTER — Telehealth: Payer: Self-pay | Admitting: Podiatry

## 2018-03-23 NOTE — Telephone Encounter (Signed)
Pt states she noticed this morning the nailbed was red like a dried scab and the toe was a little red and then was tender when pressed, but had not drainage. I told pt the toe may be tender or red to varying degrees for 2-4 weeks, that as long as it was beginning to dry up and have decreased drainage, redness or swelling it was heading in the right direction.  I told pt that if she was able to take the ibuprofen she could take as the OTC package instructed. Pt states she has a small stomach ulcer and can not take ibuprofen or aleve, but can take tylenol. I told her that would be fine.

## 2018-03-23 NOTE — Telephone Encounter (Signed)
I saw Dr. Paulla Dolly last Thursday and he removed a toenail. I've been soaking it but I'm experiencing pain, not on the nail bed but above the nail bed. Its more red. I'd like to talk to [facility] who I think was the nurse that assisted Dr. Paulla Dolly in this appointment. My cell number is [phone]. Thank you. Bye bye.

## 2018-03-23 NOTE — Telephone Encounter (Signed)
Hello, this is Courtney Waters again. I'm so sorry I missed [name]'s call. I just couldn't get there in time. I'm the one that had a toenail removed by Dr. Paulla Dolly last Thursday. My toe is hurting now and its slightly red on the toe part, not the nail bed. Again, [name] I'm sorry I missed your call. The cell number is [phone]. Thank you.

## 2018-03-23 NOTE — Telephone Encounter (Signed)
Left message requesting callback to discuss her toenail procedure.

## 2018-03-27 ENCOUNTER — Ambulatory Visit: Payer: BLUE CROSS/BLUE SHIELD | Admitting: Obstetrics & Gynecology

## 2018-03-27 ENCOUNTER — Encounter

## 2018-03-29 ENCOUNTER — Other Ambulatory Visit: Payer: Self-pay | Admitting: Internal Medicine

## 2018-03-29 DIAGNOSIS — Z1231 Encounter for screening mammogram for malignant neoplasm of breast: Secondary | ICD-10-CM

## 2018-04-13 ENCOUNTER — Ambulatory Visit
Admission: RE | Admit: 2018-04-13 | Discharge: 2018-04-13 | Disposition: A | Discharge status: Home / Self Care | Payer: BLUE CROSS/BLUE SHIELD | Source: Ambulatory Visit | Attending: Internal Medicine | Admitting: Internal Medicine

## 2018-04-13 DIAGNOSIS — Z1231 Encounter for screening mammogram for malignant neoplasm of breast: Secondary | ICD-10-CM

## 2018-04-18 ENCOUNTER — Telehealth: Payer: Self-pay | Admitting: Podiatry

## 2018-04-18 NOTE — Telephone Encounter (Signed)
I had Dr. Paulla Dolly remove a ingrown toenail on my left little toe 09 May. It is still tender to the touch when I press down on the toe right above where the toenail would start. I'm soaking it everyday and using neosporin with a band-aid. I just feel that, just wondered if it should be healing better by now or not. You can reach me at [phone]. Thank you.

## 2018-04-18 NOTE — Telephone Encounter (Signed)
I told pt the little toe may be tender due to it has no choice but to touch the side of a shoe and rub, but I felt she would benefit from being seen and to call for an appt.

## 2018-05-04 ENCOUNTER — Telehealth: Payer: Self-pay | Admitting: Podiatry

## 2018-05-04 NOTE — Telephone Encounter (Signed)
I saw Dr. Paulla Dolly on 09 May and he removed my little toenail. I'm still having pain with this and I've been soaking my foot in epsom salts as well as doing what the nurse said. There was no follow up visit scheduled and he said everything would be fine. I just don't that I'm still having pain, and I just don't feel like its healing the way it should. So please call me back on my husband's cell number of [phone]. Thank you. Bye bye.

## 2018-05-04 NOTE — Telephone Encounter (Signed)
I states the toe is still sore, has had no drainage even after the procedure, but the toe is still sore and she has been soaking and not using the neoporin and is allowing to dry. I told pt to continue this and I would have her schedule to be evaluated next week. Transferred to scheduler.

## 2018-05-07 ENCOUNTER — Ambulatory Visit: Payer: BLUE CROSS/BLUE SHIELD | Admitting: Podiatry

## 2018-05-07 DIAGNOSIS — L600 Ingrowing nail: Secondary | ICD-10-CM

## 2018-05-07 MED ORDER — GENTAMICIN SULFATE 0.1 % EX CREA: 1.0000 "application " | TOPICAL_CREAM | Freq: Three times a day (TID) | CUTANEOUS | 1 refills | Status: DC

## 2018-05-13 NOTE — Progress Notes (Signed)
   Subjective: Patient presents today 2 weeks post total nail avulsion procedure of the left fifth toe. She reports continued burning pain. She has been applying gentamicin cream daily as directed. Patient is here for further evaluation and treatment.   Past Medical History:  Diagnosis Date  . Acid reflux   . Arthritis    hands and neck  . Back pain 12/01/1998   compression fracture L1 from sledding accident  . Depression   . Fibromyalgia   . Hearing loss in right ear   . Hiatal hernia 11/2017  . MVP (mitral valve prolapse)    Hx  . SVD (spontaneous vaginal delivery)    x 2    Objective: Skin is warm, dry and supple. Nail bed and respective nail fold appears to be healing appropriately. Open wound to the associated nail fold with a granular wound base and moderate amount of fibrotic tissue. Minimal drainage noted. Mild erythema around the periungual region likely due to phenol chemical matricectomy.  Assessment: #1 postop permanent total nail avulsion left fifth toe #2 open wound periungual nail fold and nail bed of respective digit.   Plan of care: #1 patient was evaluated  #2 light debridement of open wound was performed to the periungual border and nail fold of the respective toe using a tissue nipper. Antibiotic ointment and Band-Aid was applied. #3 Recommended gentamicin cream daily with a bandage.  #4 Return to clinic in 2 weeks.   Edrick Kins, DPM [facility]  Dr. Edrick Kins, [address]                                        [place], [place] [address]                Office [phone]  Fax [phone]

## 2018-05-21 ENCOUNTER — Ambulatory Visit: Payer: BLUE CROSS/BLUE SHIELD | Admitting: Podiatry

## 2018-06-06 ENCOUNTER — Ambulatory Visit: Payer: BLUE CROSS/BLUE SHIELD | Admitting: Podiatry

## 2018-06-20 ENCOUNTER — Encounter: Payer: Self-pay | Admitting: Podiatry

## 2018-06-20 ENCOUNTER — Encounter

## 2018-06-20 ENCOUNTER — Ambulatory Visit: Payer: BLUE CROSS/BLUE SHIELD | Admitting: Podiatry

## 2018-06-20 DIAGNOSIS — L600 Ingrowing nail: Secondary | ICD-10-CM

## 2018-06-22 NOTE — Progress Notes (Signed)
   Subjective: Patient presents today 2 weeks post total nail avulsion procedure of the left fifth toe. Patient states that the toe and nail fold is feeling much better. Patient is here for further evaluation and treatment.   Past Medical History:  Diagnosis Date  . Acid reflux   . Arthritis    hands and neck  . Back pain 12/01/1998   compression fracture L1 from sledding accident  . Depression   . Fibromyalgia   . Hearing loss in right ear   . Hiatal hernia 11/2017  . MVP (mitral valve prolapse)    Hx  . SVD (spontaneous vaginal delivery)    x 2    Objective: Skin is warm, dry and supple. Nail bed and respective nail fold appears to be healing appropriately. Open wound to the associated nail fold with a granular wound base and moderate amount of fibrotic tissue. Minimal drainage noted. Mild erythema around the periungual region likely due to phenol chemical matricectomy.  Assessment: #1 postop permanent total nail avulsion #2 open wound periungual nail fold and nail bed of respective digit.   Plan of care: #1 patient was evaluated  #2 Light debridement with tissue nipper.  #3 Recommended good shoe gear.  #4 Return to clinic as needed.    Edrick Kins, DPM [facility]  Dr. Edrick Kins, [address]                                        [place], [place] [address]                Office [phone]  Fax [phone]

## 2018-07-16 ENCOUNTER — Telehealth: Payer: Self-pay | Admitting: Obstetrics & Gynecology

## 2018-07-16 NOTE — Telephone Encounter (Signed)
Patient called requesting to speak with the nurse. She'd like to know if she's had a Hep B titer and a Hep C shot.

## 2018-07-17 ENCOUNTER — Ambulatory Visit (INDEPENDENT_AMBULATORY_CARE_PROVIDER_SITE_OTHER): Payer: BLUE CROSS/BLUE SHIELD | Admitting: Physical Medicine and Rehabilitation

## 2018-07-17 ENCOUNTER — Encounter (INDEPENDENT_AMBULATORY_CARE_PROVIDER_SITE_OTHER): Payer: Self-pay | Admitting: Physical Medicine and Rehabilitation

## 2018-07-17 VITALS — BP 133/86 | HR 74 | Ht 67.5 in | Wt 150.0 lb

## 2018-07-17 DIAGNOSIS — S32010D Wedge compression fracture of first lumbar vertebra, subsequent encounter for fracture with routine healing: Secondary | ICD-10-CM | POA: Diagnosis not present

## 2018-07-17 DIAGNOSIS — M542 Cervicalgia: Secondary | ICD-10-CM

## 2018-07-17 DIAGNOSIS — M797 Fibromyalgia: Secondary | ICD-10-CM | POA: Diagnosis not present

## 2018-07-17 DIAGNOSIS — M47816 Spondylosis without myelopathy or radiculopathy, lumbar region: Secondary | ICD-10-CM | POA: Diagnosis not present

## 2018-07-17 DIAGNOSIS — G8929 Other chronic pain: Secondary | ICD-10-CM

## 2018-07-17 DIAGNOSIS — M545 Low back pain, unspecified: Secondary | ICD-10-CM

## 2018-07-17 DIAGNOSIS — G894 Chronic pain syndrome: Secondary | ICD-10-CM

## 2018-07-17 DIAGNOSIS — M79645 Pain in left finger(s): Secondary | ICD-10-CM

## 2018-07-17 DIAGNOSIS — M7918 Myalgia, other site: Secondary | ICD-10-CM

## 2018-07-17 NOTE — Telephone Encounter (Signed)
Spoke with patient. Advised no Hep B titre in system that I can location. No hep C vaccine, screening only done and negative.  Advised if no proof of 3 Hep B vaccinations needs to have titre drawn at PCP so that they can give Hep B according to CDC guidelines.  Pt agreeable and will call back prn.  Encounter closed.

## 2018-07-17 NOTE — Progress Notes (Signed)
   Numeric Pain Rating Scale and Functional Assessment Average Pain 10 Pain Right Now 3 My pain is constant and sharp Pain is worse with: bending, sitting and some activites Pain improves with: rest and therapy/exercise   In the last MONTH (on 0-10 scale) has pain interfered with the following?  1. General activity like being  able to carry out your everyday physical activities such as walking, climbing stairs, carrying groceries, or moving a chair?  Rating(6)  2. Relation with others like being able to carry out your usual social activities and roles such as  activities at home, at work and in your community. Rating(5)  3. Enjoyment of life such that you have  been bothered by emotional problems such as feeling anxious, depressed or irritable?  Rating(0)

## 2018-07-20 ENCOUNTER — Encounter (INDEPENDENT_AMBULATORY_CARE_PROVIDER_SITE_OTHER): Payer: Self-pay | Admitting: Physical Medicine and Rehabilitation

## 2018-07-20 ENCOUNTER — Telehealth (INDEPENDENT_AMBULATORY_CARE_PROVIDER_SITE_OTHER): Payer: Self-pay | Admitting: Radiology

## 2018-07-20 NOTE — Progress Notes (Signed)
Courtney Waters - 62 y.o. female MRN [id number]  Date of birth: 1956-01-06  Office Visit Note: Visit Date: 07/17/2018 PCP: Leanna Battles, MD Referred by: Leanna Battles, MD  Subjective: Chief Complaint  Patient presents with  . Neck - Pain  . Left Shoulder - Pain  . Right Shoulder - Pain  . Middle Back - Pain  . Left Hand - Pain   HPI: [name] is a 62 year old female accompanied by her husband who provides some of the history.  She comes in today at the request for evaluation and management by Dr. Leanna Battles for what he was thinking which was mainly low back pain and hip pain.  She actually reports 2 distinct complaints of neck pain and shoulder pain as well as back and hip pain.  Her history is somewhat complicated because she does see Dr. Estanislado Pandy with a history of active fibromyalgia and she is seen her recently with reports of all over body pain with active tender points etc.  She takes Cymbalta 60 mg daily as well as tizanidine at night.  She is also followed in pain management at [facility] pain management by Dr. Margaretha Sheffield, MD. she is on 5 mg of oxycodone through their office.  She reports having had prior lumbar ablation procedure by Dr. Greta Doom.  We unfortunately do not have any of their notes.  Nonetheless, the patient reports low back pain really across the lower back with more pain with standing than sitting.  Bending seems to initiate pain as well.  She reports her current pain level is a 3 out of 10 but she says she averages 10 out of 10.  She reports some improvement with rest and exercises and therapy.  She does go to water therapy a few days a week.  She does not endorse pain down the legs or focal weakness.  She reports a remote L1 compression fracture that she regards really as an injury at this point and there is clearly thought processes that this is still an ongoing problem for her.  There was an MRI performed in 2015 which is reviewed below.  This is injury  occurred in the early 2000's from a skiing incident where she fell backwards on her tailbone.  She had excruciating pain at that point.  This shows an anterior compression of 30 to 40%.  Reviewing the images with the patient today is not a very severe compression at all and there is no retropulsion.  Briefly the MRI does show very mild scoliosis no focal nerve compression or stenosis.  She does have some facet arthropathy.  She has had no recent injury or bowel or bladder changes.  She reports being unable to take any anti-inflammatories because of a very small ulcer that was found in her GI doctor said she cannot take any nonsteroidal anti-inflammatories.  She has these listed as allergies.  Her biggest complaint today is really upper back pain centered around the C7 spinous process in the bilateral shoulder blades.  She reports left more than right sided pain but bilateral.  She feels like the pain radiates from that lower part of the cervical spine to the shoulders and shoulder blades.  She gets some pain in the left hand and thumb.  She cannot really relate when you ask her if the pain occurs at the same time into the hand.  She does not really seem to endorse radicular pain down the arm to the hand.  She does not endorse  any paresthesia.  She reports her neck and upper back pain is ongoing since 2011.  Again activity changes and rest seem to help.  Medications seem to help to a degree.   Review of Systems  Constitutional: Positive for malaise/fatigue. Negative for chills, fever and weight loss.  HENT: Negative for hearing loss and sinus pain.   Eyes: Negative for blurred vision, double vision and photophobia.  Respiratory: Negative for cough and shortness of breath.   Cardiovascular: Negative for chest pain, palpitations and leg swelling.  Gastrointestinal: Negative for abdominal pain, nausea and vomiting.  Genitourinary: Negative for flank pain.  Musculoskeletal: Positive for back pain, joint pain  and neck pain. Negative for myalgias.  Skin: Negative for itching and rash.  Neurological: Negative for tingling, tremors, focal weakness and weakness.  Endo/Heme/Allergies: Negative.   Psychiatric/Behavioral: Negative for depression.  All other systems reviewed and are negative.  Otherwise per HPI.  Assessment & Plan: Visit Diagnoses: No diagnosis found.  Plan: Findings:  1.  Chronic back pain and chronic pain syndrome complicated by remote L1 compression fracture with mild scoliosis and significant fibromyalgia.  Exam shows patient to have tender points really all over the body along with focal trigger point some of the quadratus lumborum.  She has pain with extension which could be facet mediated pain.  We had a long discussion about the natural history of compression fractures and in all likelihood this compression fracture is not painful at this point.  There was no retropulsion and while it was 40% I showed on the actual pictures is just the anterior part that is wedged.  There is no increased kyphosis here.  We talked about fibromyalgia and the role of exercise and sleep.  We talked about her current medications which she is on which I think is very appropriate.  We talked about the difficulty of any treatment given the fact that she is in pain management and they really should be the ones to focus on all of her pain management at this point.  Dr. Greta Doom is a physiatry wrist who is very well versed in most of the things that I would do except may be some higher level spine procedures.  I think at this point one thing that could be looked at is the role of Celebrex.  I have asked her to follow-up with her GI doctor and even Dr. Estanislado Pandy since she is seeing her coming up very soon to see if she could take Celebrex and how safe that would be with the finding of a very small ulcer one time.  Celebrex may be an added benefit to her since she cannot take any anti-inflammatory at all.  I think at this  point she will continue to follow-up with Dr. Greta Doom for her back pain with intermittent use of the oxycodone and possible repeat of the radiofrequency ablation.  Repeat MRI could be considered if the pain is more radicular and new onset.  2.  In terms of her neck pain I feel like this is actually more myofascial mixed with the fibromyalgia.  There is no red flag complaints of radicular pain even though she does report some pain into the hand.  This seems to be [facility] joint arthritis and this is something I think Dr. Estanislado Pandy is looked at.  She could have concomitant carpal tunnel problems but no real numbness tingling or paresthesia.  Exam does show focal trigger points in the levator scapula and rhomboid.  I discussed with her the  use of massage therapy and she does know someone and I think if she tolerates massage she might get some relief with that.  We also discussed physical therapy with dry needling if she would tolerate that.  I also discussed talking to Dr. Greta Doom about possible trigger point injection.  She feels like they have lately just been refilling her medications.  I think this is something she can talk to them about.  I do not feel like an MRI of the cervical spine is really warranted at this point.  I will try to make my notes sent to the various providers that she is seeing to try to minimize any replication.  Unfortunately her fibromyalgia is very consistently modifying her pain complaints at this point.  She might do well if she has not tried a Loss adjuster, chartered with cognitive behavioral therapy.  I will see her back just to see if she is gotten any changes with medication and if there is anything to think about otherwise.    Meds & Orders: No orders of the defined types were placed in this encounter.  No orders of the defined types were placed in this encounter.   Follow-up: No follow-ups on file.   Procedures: No procedures performed  No notes on file   Clinical History: No specialty  comments available.   She reports that she has never smoked. She has never used smokeless tobacco. No results for input(s): HGBA1C, LABURIC in the last 8760 hours.  Objective:  VS:  HT:5' 7.5" (171.5 cm)   WT:150 lb (68 kg)  BMI:23.13    BP:133/86  HR:74bpm  TEMP: ( )  RESP:  Physical Exam  Constitutional: She is oriented to person, place, and time. She appears well-developed and well-nourished. No distress.  HENT:  Head: Normocephalic and atraumatic.  Nose: Nose normal.  Mouth/Throat: Oropharynx is clear and moist.  Eyes: Pupils are equal, round, and reactive to light. Conjunctivae are normal.  Neck: Normal range of motion. Neck supple. No JVD present.  Cardiovascular: Regular rhythm and intact distal pulses.  Pulmonary/Chest: Effort normal. No respiratory distress.  Abdominal: Soft. She exhibits no distension. There is no guarding.  Musculoskeletal:  Patient stands with slightly exaggerated thoracic kyphosis and lumbar lordosis.  She has pain going from sit to stand with extension.  She has tender points really at the shoulder blade upper shoulders elbows knees.  She has focal trigger points and taut band in the levator scapula and rhomboids bilaterally and this reproduces a lot of her pain.  She has no shoulder impingement.  She has good strength in the lower extremities bilaterally without deficit.  No clonus.  No pain with hip rotation.  Lymphadenopathy:    She has no cervical adenopathy.  Neurological: She is alert and oriented to person, place, and time. She exhibits normal muscle tone. Coordination normal.  Skin: Skin is warm. No rash noted. No erythema.  Psychiatric: She has a normal mood and affect. Her behavior is normal.  Nursing note and vitals reviewed.   Ortho Exam Imaging: No results found.  Past Medical/Family/Surgical/Social History: Medications & Allergies reviewed per EMR, new medications updated. Patient Active Problem List   Diagnosis Date Noted  . Primary  osteoarthritis of both knees 07/25/2017  . Screening for osteoporosis 07/25/2017  . DDD (degenerative disc disease), lumbar 07/25/2017  . Primary osteoarthritis of both hands 01/26/2017  . Primary osteoarthritis of both feet 01/26/2017  . Other idiopathic scoliosis, lumbar region 01/26/2017  . Fibromyalgia 02/05/2015  .  METATARSALGIA 08/14/2007  . BUNIONS, BILATERAL 08/14/2007  . UNEQUAL LEG LENGTH, ACQUIRED 08/14/2007  . HEEL PAIN, LEFT 08/08/2007  . Osteopenia 08/08/2007   Past Medical History:  Diagnosis Date  . Acid reflux   . Arthritis    hands and neck  . Back pain 12/01/1998   compression fracture L1 from sledding accident  . Depression   . Fibromyalgia   . Hearing loss in right ear   . Hiatal hernia 11/2017  . MVP (mitral valve prolapse)    Hx  . SVD (spontaneous vaginal delivery)    x 2   Family History  Problem Relation Age of Onset  . Diabetes Mother   . Hearing loss Mother   . Cancer Father        GI  . Heart disease Sister   . Hearing loss Brother   . Breast cancer Maternal Aunt   . Ovarian cancer Other        MGM, mid 77's   Past Surgical History:  Procedure Laterality Date  . BUNIONECTOMY Left 10/16/14   Dr. Durward Fortes  . COLONOSCOPY    . 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 LAPAROSCOPIC BILATERAL SALPINGO OOPHERECTOMY Bilateral 05/13/2014   Procedure: LAPAROSCOPIC BILATERAL SALPINGO OOPHORECTOMY;  Surgeon: Lyman Speller, MD;  Location: [facility];  Service: Gynecology;  Laterality: Bilateral;  . TONSILLECTOMY  age 47  . WISDOM TOOTH EXTRACTION  age 65   Social History   Occupational History  . Not on file  Tobacco Use  . Smoking status: Never Smoker  . Smokeless tobacco: Never Used  Substance and Sexual Activity  . Alcohol use: Yes    Alcohol/week: 1.0 - 2.0 standard drinks    Types: 1 - 2 Standard drinks or equivalent per week    Comment: socially  .  Drug use: No  . Sexual activity: Yes    Partners: Male    Birth control/protection: Post-menopausal

## 2018-07-20 NOTE — Telephone Encounter (Signed)
Patient left message to let Dr. Ernestina Patches know Dr. Collene Mares approved of her taking celebrex. She requests for Dr. Ernestina Patches to send in Rx to [facility] [address].  cb # [phone]

## 2018-07-20 NOTE — Telephone Encounter (Signed)
Incorrect phone number in previous message  Corrected phone number is.... [phone] cellphone

## 2018-07-23 ENCOUNTER — Other Ambulatory Visit (INDEPENDENT_AMBULATORY_CARE_PROVIDER_SITE_OTHER): Payer: Self-pay | Admitting: Physical Medicine and Rehabilitation

## 2018-07-23 MED ORDER — CELECOXIB 200 MG PO CAPS: 200.0000 mg | ORAL_CAPSULE | Freq: Every day | ORAL | 0 refills | Status: DC

## 2018-08-07 NOTE — Progress Notes (Signed)
Office Visit Note  Patient: Courtney Waters             Date of Birth: Mar 16, 1956           MRN: [id number]             PCP: Leanna Battles, MD Referring: Leanna Battles, MD Visit Date: 08/21/2018 Occupation: _0 @  Subjective:  Generalized pain   History of Present Illness: Courtney Waters is a 62 y.o. female with history of fibromyalgia and osteoarthritis.  Patient reports she continues to have generalized muscle aches and muscle tenderness.  She states that this past summer she is on increased joint pain in multiple joints.  She states that she followed up with Dr. Ernestina Patches on 07/17/2018 to discuss the joint pain she was experiencing.  He started her on Celebrex 200 mg by mouth daily.  She was cleared by Dr. Collene Mares and encouraged to take omeprazole 30 minutes prior to Celebrex. Her PCP prescribed Gabapentin, which she has not tried due to being apprehensive of side effects.  She has noticed significant benefit since starting on Celebrex. Her morning stiffness has improved significantly from 2 hours to about 30 minutes every morning.  She has left CMC joint pain, and she uses voltaren gel topically PRN. She continues to go to a fibromyalgia swim class 3 times weekly.  She has also started seeing a massage therapist.  She continues to have trapezius muscle tension. She has been sleeping better, but continues to have fatigue and occasional daytime drowsiness.    Activities of Daily Living:  Patient reports morning stiffness for 30   minutes.   Patient Denies nocturnal pain.  Difficulty dressing/grooming: Denies Difficulty climbing stairs: Denies Difficulty getting out of chair: Denies Difficulty using hands for taps, buttons, cutlery, and/or writing: Denies  Review of Systems  Constitutional: Positive for fatigue.  HENT: Positive for mouth dryness. Negative for mouth sores and nose dryness.   Eyes: Positive for dryness. Negative for pain and visual disturbance.  Respiratory: Negative  for cough, hemoptysis, shortness of breath and difficulty breathing.   Cardiovascular: Negative for chest pain, palpitations, hypertension and swelling in legs/feet.  Gastrointestinal: Positive for constipation. Negative for blood in stool and diarrhea.  Endocrine: Negative for increased urination.  Genitourinary: Negative for painful urination.  Musculoskeletal: Positive for arthralgias, joint pain, myalgias, morning stiffness, muscle tenderness and myalgias. Negative for joint swelling and muscle weakness.  Skin: Negative for color change, pallor, rash, hair loss, nodules/bumps, skin tightness, ulcers and sensitivity to sunlight.  Allergic/Immunologic: Negative for susceptible to infections.  Neurological: Negative for dizziness, numbness, headaches and weakness.  Hematological: Negative for swollen glands.  Psychiatric/Behavioral: Positive for sleep disturbance. Negative for depressed mood. The patient is not nervous/anxious.     PMFS History:  Patient Active Problem List   Diagnosis Date Noted  . Primary osteoarthritis of both knees 07/25/2017  . Screening for osteoporosis 07/25/2017  . DDD (degenerative disc disease), lumbar 07/25/2017  . Primary osteoarthritis of both hands 01/26/2017  . Primary osteoarthritis of both feet 01/26/2017  . Other idiopathic scoliosis, lumbar region 01/26/2017  . Fibromyalgia 02/05/2015  . METATARSALGIA 08/14/2007  . BUNIONS, BILATERAL 08/14/2007  . UNEQUAL LEG LENGTH, ACQUIRED 08/14/2007  . HEEL PAIN, LEFT 08/08/2007  . Osteopenia 08/08/2007    Past Medical History:  Diagnosis Date  . Acid reflux   . Arthritis    hands and neck  . Back pain 12/01/1998   compression fracture L1 from sledding accident  . Depression   .  Fibromyalgia   . Hearing loss in right ear   . Hiatal hernia 11/2017  . MVP (mitral valve prolapse)    Hx  . SVD (spontaneous vaginal delivery)    x 2    Family History  Problem Relation Age of Onset  . Diabetes Mother     . Hearing loss Mother   . Cancer Father        GI  . Heart disease Sister   . Hearing loss Brother   . Breast cancer Maternal Aunt   . Ovarian cancer Other        MGM, mid 60's   Past Surgical History:  Procedure Laterality Date  . BUNIONECTOMY Left 10/16/14   Dr. Durward Fortes  . COLONOSCOPY    . 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 LAPAROSCOPIC BILATERAL SALPINGO OOPHERECTOMY Bilateral 05/13/2014   Procedure: LAPAROSCOPIC BILATERAL SALPINGO OOPHORECTOMY;  Surgeon: Lyman Speller, MD;  Location: [facility];  Service: Gynecology;  Laterality: Bilateral;  . toe nail removal Left 03/2018  . TONSILLECTOMY  age 70  . WISDOM TOOTH EXTRACTION  age 76   Social History   Social History Narrative  . Not on file    Objective: Vital Signs: BP 125/77 (BP Location: Right Arm, Patient Position: Sitting, Cuff Size: Small)   Pulse 81   Resp 12   Ht 5' 7.5" (1.715 m)   Wt 149 lb 9.6 oz (67.9 kg)   LMP 11/07/2009 Comment: spotting x 2days  BMI 23.08 kg/m    Physical Exam  Constitutional: She is oriented to person, place, and time. She appears well-developed and well-nourished.  HENT:  Head: Normocephalic and atraumatic.  Eyes: Conjunctivae and EOM are normal.  Neck: Normal range of motion.  Cardiovascular: Normal rate, regular rhythm, normal heart sounds and intact distal pulses.  Pulmonary/Chest: Effort normal and breath sounds normal.  Abdominal: Soft. Bowel sounds are normal.  Lymphadenopathy:    She has no cervical adenopathy.  Neurological: She is alert and oriented to person, place, and time.  Skin: Skin is warm and dry. Capillary refill takes less than 2 seconds.  Psychiatric: She has a normal mood and affect. Her behavior is normal.  Nursing note and vitals reviewed.    Musculoskeletal Exam: C-spine good ROM.  Mild postural kyphosis. No midline spinal tenderness.  No SI joint tenderness.   Shoulder joints, elbow joints, wrist joints, MCPs, PIPs, and DIPs good ROM with no synovitis.  Bilateral CMC joint thickening, left worse than right. PIP and DIP synovial thickening.   Hip joints, knee joints, ankle joints, MTPs, PIPs, and DIPs good ROM with no synovitis.  No warmth or effusion of knee joints.  No tenderness or swelling of ankle joints.     CDAI Exam: CDAI Score: Not documented Patient Global Assessment: Not documented; Provider Global Assessment: Not documented Swollen: Not documented; Tender: Not documented Joint Exam   Not documented   There is currently no information documented on the homunculus. Go to the Rheumatology activity and complete the homunculus joint exam.  Investigation: No additional findings.  Imaging: No results found.  Recent Labs: Lab Results  Component Value Date   WBC 5.6 01/31/2017   HGB 12.1 01/31/2017   PLT 390 01/31/2017   NA 139 02/07/2017   K 4.6 02/07/2017   CL 105 02/07/2017   CO2 23 02/07/2017   GLUCOSE 85 02/07/2017   BUN 15 02/07/2017  CREATININE 0.92 02/07/2017   BILITOT 0.5 02/07/2017   ALKPHOS 55 02/07/2017   AST 16 02/07/2017   ALT 10 02/07/2017   PROT 6.6 02/07/2017   ALBUMIN 3.9 02/07/2017   CALCIUM 9.5 02/07/2017   GFRAA  10/07/2009    >60        The eGFR has been calculated using the MDRD equation. This calculation has not been validated in all clinical situations. eGFR's persistently <60 mL/min signify possible Chronic Kidney Disease.    Speciality Comments: No specialty comments available.  Procedures:  No procedures performed Allergies: Nsaids   Assessment / Plan:     Visit Diagnoses: Fibromyalgia: She continues to have generalized muscle aches and muscle tenderness.  She continues to take Cymbalta 60 mg by mouth daily  She has trapezius muscle tenderness and tension bilaterally.  She was recently started on Celebrex 200 mg by mouth daily, which she is tolerating and has noticed significant pain  relief.  She was cleared by Dr. Collene Mares to start on Celebrex, and she has been taking Omeprazole 30 minutes prior to taking Celebrex every morning. She has also seen a massage therapist twice.  She continues to go to fibromyalgia water exercise class 3 times weekly.  She was prescribed Gabapentin by her PCP but is apprehensive to start at this time due to possible side effects.  We discussed at length the risks and benefits of Celebrex and Gabapentin. She has been sleeping better but continues to have chronic fatigue. She was encouraged to stay active and exercise on a regular basis.  She will return in 6 months.   Primary osteoarthritis of both hands: She has PIP and DIP synovial thickening.  Bilateral CMC joint synovial thickening.  No synovitis noted.  Taking celebrex has improved her morning stiffness and left CMC joint pain.   Primary osteoarthritis of both knees: No warmth or effusion.  Good ROM with no discomfort.   Primary osteoarthritis of both feet: She has no discomfort in her feet at this time.  She wears proper fitting shoes.   DDD (degenerative disc disease), lumbar: Chronic pain.  She will be follow up with Dr. Ernestina Patches on 08/23/18.   Other fatigue: Chronic   Other chronic pain - She is on oxycodone by pain clinic.  Osteopenia of multiple sites - DEXA has been monitored by her PCP.  Other idiopathic scoliosis, lumbar region  History of gastroesophageal reflux (GERD)   Orders: No orders of the defined types were placed in this encounter.  No orders of the defined types were placed in this encounter.   Face-to-face time spent with patient was 30 minutes. Greater than 50% of time was spent in counseling and coordination of care.  Follow-Up Instructions: Return in about 6 months (around 02/20/2019) for Fibromyalgia, Osteoarthritis.   Ofilia Neas, PA-C  Note - This record has been created using Dragon software.  Chart creation errors have been sought, but may not always  have  been located. Such creation errors do not reflect on  the standard of medical care.

## 2018-08-11 ENCOUNTER — Other Ambulatory Visit: Payer: Self-pay | Admitting: Rheumatology

## 2018-08-13 NOTE — Telephone Encounter (Signed)
Last Visit: 02/06/18 Next Visit: 08/21/18   Okay to refill per Dr. Estanislado Pandy

## 2018-08-18 ENCOUNTER — Other Ambulatory Visit (INDEPENDENT_AMBULATORY_CARE_PROVIDER_SITE_OTHER): Payer: Self-pay | Admitting: Physical Medicine and Rehabilitation

## 2018-08-21 ENCOUNTER — Ambulatory Visit: Payer: BLUE CROSS/BLUE SHIELD | Admitting: Physician Assistant

## 2018-08-21 ENCOUNTER — Encounter: Payer: Self-pay | Admitting: Physician Assistant

## 2018-08-21 ENCOUNTER — Ambulatory Visit (INDEPENDENT_AMBULATORY_CARE_PROVIDER_SITE_OTHER): Payer: BLUE CROSS/BLUE SHIELD | Admitting: Physical Medicine and Rehabilitation

## 2018-08-21 VITALS — BP 125/77 | HR 81 | Resp 12 | Ht 67.5 in | Wt 149.6 lb

## 2018-08-21 DIAGNOSIS — G8929 Other chronic pain: Secondary | ICD-10-CM

## 2018-08-21 DIAGNOSIS — M797 Fibromyalgia: Secondary | ICD-10-CM | POA: Diagnosis not present

## 2018-08-21 DIAGNOSIS — R5383 Other fatigue: Secondary | ICD-10-CM

## 2018-08-21 DIAGNOSIS — M19071 Primary osteoarthritis, right ankle and foot: Secondary | ICD-10-CM | POA: Diagnosis not present

## 2018-08-21 DIAGNOSIS — M170 Bilateral primary osteoarthritis of knee: Secondary | ICD-10-CM

## 2018-08-21 DIAGNOSIS — M19042 Primary osteoarthritis, left hand: Secondary | ICD-10-CM

## 2018-08-21 DIAGNOSIS — M4126 Other idiopathic scoliosis, lumbar region: Secondary | ICD-10-CM

## 2018-08-21 DIAGNOSIS — M19072 Primary osteoarthritis, left ankle and foot: Secondary | ICD-10-CM

## 2018-08-21 DIAGNOSIS — M8589 Other specified disorders of bone density and structure, multiple sites: Secondary | ICD-10-CM

## 2018-08-21 DIAGNOSIS — Z8719 Personal history of other diseases of the digestive system: Secondary | ICD-10-CM

## 2018-08-21 DIAGNOSIS — M19041 Primary osteoarthritis, right hand: Secondary | ICD-10-CM | POA: Diagnosis not present

## 2018-08-21 DIAGNOSIS — M5136 Other intervertebral disc degeneration, lumbar region: Secondary | ICD-10-CM

## 2018-08-21 NOTE — Telephone Encounter (Signed)
Please advise 

## 2018-08-23 ENCOUNTER — Encounter (INDEPENDENT_AMBULATORY_CARE_PROVIDER_SITE_OTHER): Payer: Self-pay | Admitting: Physical Medicine and Rehabilitation

## 2018-08-23 ENCOUNTER — Ambulatory Visit (INDEPENDENT_AMBULATORY_CARE_PROVIDER_SITE_OTHER): Payer: BLUE CROSS/BLUE SHIELD | Admitting: Physical Medicine and Rehabilitation

## 2018-08-23 VITALS — BP 131/89 | HR 84 | Ht 67.5 in | Wt 149.0 lb

## 2018-08-23 DIAGNOSIS — M542 Cervicalgia: Secondary | ICD-10-CM | POA: Diagnosis not present

## 2018-08-23 DIAGNOSIS — G894 Chronic pain syndrome: Secondary | ICD-10-CM | POA: Diagnosis not present

## 2018-08-23 DIAGNOSIS — M7918 Myalgia, other site: Secondary | ICD-10-CM | POA: Diagnosis not present

## 2018-08-23 DIAGNOSIS — F1190 Opioid use, unspecified, uncomplicated: Secondary | ICD-10-CM

## 2018-08-23 DIAGNOSIS — M797 Fibromyalgia: Secondary | ICD-10-CM | POA: Diagnosis not present

## 2018-08-23 NOTE — Progress Notes (Signed)
 .  Numeric Pain Rating Scale and Functional Assessment Average Pain 3 Pain Right Now 3 My pain is constant and aching Pain is worse with: bending and some activites Pain improves with: rest and therapy/exercise   In the last MONTH (on 0-10 scale) has pain interfered with the following?  1. General activity like being  able to carry out your everyday physical activities such as walking, climbing stairs, carrying groceries, or moving a chair?  Rating(4)  2. Relation with others like being able to carry out your usual social activities and roles such as  activities at home, at work and in your community. Rating(2)  3. Enjoyment of life such that you have  been bothered by emotional problems such as feeling anxious, depressed or irritable?  Rating(3)

## 2018-08-24 ENCOUNTER — Telehealth (INDEPENDENT_AMBULATORY_CARE_PROVIDER_SITE_OTHER): Payer: Self-pay | Admitting: Radiology

## 2018-08-24 MED ORDER — CELECOXIB 200 MG PO CAPS: 200.0000 mg | ORAL_CAPSULE | Freq: Every day | ORAL | 3 refills | Status: DC

## 2018-08-24 NOTE — Telephone Encounter (Signed)
Patient aware.

## 2018-08-24 NOTE — Telephone Encounter (Signed)
Patient called states she was seen yesterday 08/23/18 by Dr. Ernestina Patches. States pharmacy [facility] [address] has not received Rx for 3 month supply of Celebrex yet.  Call back # [phone]

## 2018-08-24 NOTE — Telephone Encounter (Signed)
Please let her know it should be there, I had refilled one time through pharmacy request but 90 day supply should be in there now

## 2018-08-28 NOTE — Progress Notes (Signed)
Courtney Waters - 62 y.o. female MRN [id number]  Date of birth: [birth year]  Office Visit Note: Visit Date: 08/23/2018 PCP: Leanna Battles, MD Referred by: Leanna Battles, MD  Subjective: Chief Complaint  Patient presents with  . Neck - Pain  . Right Shoulder - Pain  . Left Shoulder - Pain  . Left Elbow - Pain  . Right Elbow - Pain  . Left Wrist - Pain  . Right Wrist - Pain   HPI: Courtney Waters is a 62 y.o. female who comes in today Follow-up evaluation after starting Celebrex for chronic pain syndrome and fibromyalgia and neck and back pain.  She was able to get approval from her GI doctors to start Celebrex at 200 mg daily.  They suggested that she should take omeprazole along with this.  She has felt like this is been an absolute miracle and she really questions 10 but he had tried it before.  I did tell her that is nothing magical that nonsteroidal anti-inflammatories are known to work is just a [employer], [employer] of trying to figure out a way to do this in a healthy manner.  She has no history of heart disease or other issues like that but she does have some GI history.  She rates her average pain now is a 3 out of 10 which is much improved.  She is also seen a massage therapist that I recommended and felt like that is helped quite a bit as well.  She still continues to have neck pain and pain in both shoulders and elbows and wrists.  She reports this pain really began around 2011 and is just cannot worsen over the years.  She does get increasing symptoms with stress and movement.  She does attend fibromyalgia swim class and she is monitoring the fibromyalgia quite well.  She has no other new complaints.  She still remains active and doing well other than just the frustration of having fibromyalgia symptoms.  Review of Systems  Constitutional: Positive for malaise/fatigue. Negative for chills, fever and weight loss.  HENT: Negative for hearing loss and sinus pain.   Eyes: Negative for blurred  vision, double vision and photophobia.  Respiratory: Negative for cough and shortness of breath.   Cardiovascular: Negative for chest pain, palpitations and leg swelling.  Gastrointestinal: Negative for abdominal pain, nausea and vomiting.  Genitourinary: Negative for flank pain.  Musculoskeletal: Positive for back pain, joint pain and neck pain. Negative for myalgias.  Skin: Negative for itching and rash.  Neurological: Negative for tremors, focal weakness and weakness.  Endo/Heme/Allergies: Negative.   Psychiatric/Behavioral: Negative for depression.  All other systems reviewed and are negative.  Otherwise per HPI.  Assessment & Plan: Visit Diagnoses:  1. Cervicalgia   2. Myofascial pain syndrome   3. Fibromyalgia   4. Chronic pain syndrome   5. Uncomplicated opioid use     Plan: Findings:  Significantly improved pain symptoms after starting Celebrex.  She continues to follow with rheumatology and continues active exercise and fibromyalgia swim class.  We had a talk about sleep hygiene and the best way to try to get better sleep.  No fibromyalgia really has no real great cure and its a combination of cognitive therapy as well as good sleep and exercise and not focusing on anatomical factors related to pain while we have to also rule out issues she is doing well otherwise.  She will continue with the Celebrex.  I am happy to provide that prescription to  her and we can continue to monitor with her GI doctors.  She could also have this prescribed by Dr. Philip Aspen her primary care physician.    Meds & Orders:  Meds ordered this encounter  Medications  . celecoxib (CELEBREX) 200 MG capsule    Sig: Take 1 capsule (200 mg total) by mouth daily.    Dispense:  90 capsule    Refill:  3   No orders of the defined types were placed in this encounter.   Follow-up: Return if symptoms worsen or fail to improve.   Procedures: No procedures performed  No notes on file   Clinical History: MRI  LUMBAR SPINE WITHOUT CONTRAST  TECHNIQUE: Multiplanar, multisequence MR imaging of the lumbar spine was performed. No intravenous contrast was administered.  COMPARISON:  None.  FINDINGS: Last fully open disk space is labeled L5-S1. Present examination incorporates from T11 through the lower sacrum.  Conus L1 level.  Fluid structure arises from the pelvis is a completely assessed on present exam. Although typically one would assume that this represents a distended bladder, there is a lobulated component with 2 different signal intensities and therefore primary pelvic cystic mass such as ovarian lesion needs to be considered and pelvic ultrasound recommended for further delineation.  Small renal lesions may be cysts although incompletely assessed. There may also be a sub cm right lobe liver cyst.  Remote anterior wedge compression fracture of L1 involving the superior endplate with 75% loss height anteriorly.  No acute osseous abnormality noted.  Scoliosis with superimposed degenerative changes including:  T11-12: Facet joint degenerative changes. Foraminal narrowing on the right. Left foramen not imaged. Minimal anterior slip T11. Minimal bulge. Axial images not performed. No significant spinal stenosis.  T12-L1: Mild facet joint degenerative changes greater on left. Minimal posterior projection of the posterior superior aspect of the compressed L1 vertebral body. No significant spinal stenosis or foraminal narrowing.  L1-2:  Mild facet joint degenerative changes.  Minimal bulge.  L2-3: Facet joint degenerative changes. Mild rotation and minimal retrolisthesis with bulge. No significant spinal stenosis or foraminal narrowing.  L3-4: Facet joint degenerative changes. Bulge/spur greatest left lateral position without compression of the exiting nerve root. Very mild spinal stenosis.  L4-5: Facet joint degenerative changes. Bulge/ spur slightly greater to  the right without nerve root compression or significant spinal stenosis.  L5-S1:  Mild facet joint degenerative changes.  IMPRESSION: Remote L1 compression fracture. Scoliosis with superimposed degenerative changes but without significant spinal stenosis or nerve root compression as detailed above.  Findings raise the possibility of large cystic ovarian mass as noted above. Ultrasound recommended for further delineation.  These results will be called to the ordering clinician or representative by the Radiologist Assistant, and communication documented in the PACS or zVision Dashboard.   Electronically Signed   By: Chauncey Cruel M.D.   On: 04/17/2014 18:11   She reports that she has never smoked. She has never used smokeless tobacco. No results for input(s): HGBA1C, LABURIC in the last 8760 hours.  Objective:  VS:  HT:5' 7.5" (171.5 cm)   WT:149 lb (67.6 kg)  BMI:22.98    BP:131/89  HR:84bpm  TEMP: ( )  RESP:100 % Physical Exam  Constitutional: She is oriented to person, place, and time. She appears well-developed and well-nourished.  Eyes: Pupils are equal, round, and reactive to light. Conjunctivae and EOM are normal.  Cardiovascular: Normal rate and intact distal pulses.  Pulmonary/Chest: Effort normal.  Musculoskeletal:  Patient ambulates without aid with  good distal strength.  She has forward flexed cervical spine with continued trigger points which are less active.  Neurological: She is alert and oriented to person, place, and time. She exhibits normal muscle tone. Coordination normal.  Skin: Skin is warm and dry. No rash noted. No erythema.  Psychiatric: She has a normal mood and affect. Her behavior is normal.  Nursing note and vitals reviewed.   Ortho Exam Imaging: No results found.  Past Medical/Family/Surgical/Social History: Medications & Allergies reviewed per EMR, new medications updated. Patient Active Problem List   Diagnosis Date Noted  .  Primary osteoarthritis of both knees 07/25/2017  . Screening for osteoporosis 07/25/2017  . DDD (degenerative disc disease), lumbar 07/25/2017  . Primary osteoarthritis of both hands 01/26/2017  . Primary osteoarthritis of both feet 01/26/2017  . Other idiopathic scoliosis, lumbar region 01/26/2017  . Fibromyalgia 02/05/2015  . METATARSALGIA 08/14/2007  . BUNIONS, BILATERAL 08/14/2007  . UNEQUAL LEG LENGTH, ACQUIRED 08/14/2007  . HEEL PAIN, LEFT 08/08/2007  . Osteopenia 08/08/2007   Past Medical History:  Diagnosis Date  . Acid reflux   . Arthritis    hands and neck  . Back pain 12/01/1998   compression fracture L1 from sledding accident  . Depression   . Fibromyalgia   . Hearing loss in right ear   . Hiatal hernia 11/2017  . MVP (mitral valve prolapse)    Hx  . SVD (spontaneous vaginal delivery)    x 2   Family History  Problem Relation Age of Onset  . Diabetes Mother   . Hearing loss Mother   . Cancer Father        GI  . Heart disease Sister   . Hearing loss Brother   . Breast cancer Maternal Aunt   . Ovarian cancer Other        MGM, mid 55's   Past Surgical History:  Procedure Laterality Date  . BUNIONECTOMY Left 10/16/14   Dr. Durward Fortes  . COLONOSCOPY    . 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 LAPAROSCOPIC BILATERAL SALPINGO OOPHERECTOMY Bilateral 05/13/2014   Procedure: LAPAROSCOPIC BILATERAL SALPINGO OOPHORECTOMY;  Surgeon: Lyman Speller, MD;  Location: [facility];  Service: Gynecology;  Laterality: Bilateral;  . toe nail removal Left 03/2018  . TONSILLECTOMY  age 20  . WISDOM TOOTH EXTRACTION  age 25   Social History   Occupational History  . Not on file  Tobacco Use  . Smoking status: Never Smoker  . Smokeless tobacco: Never Used  Substance and Sexual Activity  . Alcohol use: Yes    Alcohol/week: 1.0 - 2.0 standard drinks    Types: 1 - 2 Standard drinks or  equivalent per week    Comment: socially  . Drug use: No  . Sexual activity: Yes    Partners: Male    Birth control/protection: Post-menopausal

## 2018-12-04 DIAGNOSIS — Z1331 Encounter for screening for depression: Secondary | ICD-10-CM | POA: Diagnosis not present

## 2018-12-04 DIAGNOSIS — R1319 Other dysphagia: Secondary | ICD-10-CM | POA: Diagnosis not present

## 2018-12-04 DIAGNOSIS — M545 Low back pain: Secondary | ICD-10-CM | POA: Diagnosis not present

## 2018-12-04 DIAGNOSIS — M542 Cervicalgia: Secondary | ICD-10-CM | POA: Diagnosis not present

## 2018-12-04 DIAGNOSIS — M797 Fibromyalgia: Secondary | ICD-10-CM | POA: Diagnosis not present

## 2018-12-04 DIAGNOSIS — Z6821 Body mass index (BMI) 21.0-21.9, adult: Secondary | ICD-10-CM | POA: Diagnosis not present

## 2019-02-19 ENCOUNTER — Other Ambulatory Visit: Payer: Self-pay | Admitting: Rheumatology

## 2019-02-19 NOTE — Telephone Encounter (Signed)
Last Visit: 08/21/2018 Next Visit: 05/09/2019  Okay to refill per Dr. Estanislado Pandy.

## 2019-02-21 ENCOUNTER — Ambulatory Visit: Payer: BLUE CROSS/BLUE SHIELD | Admitting: Rheumatology

## 2019-03-04 ENCOUNTER — Ambulatory Visit: Payer: BLUE CROSS/BLUE SHIELD | Admitting: Obstetrics & Gynecology

## 2019-03-18 ENCOUNTER — Other Ambulatory Visit: Payer: Self-pay | Admitting: Internal Medicine

## 2019-03-18 DIAGNOSIS — Z1231 Encounter for screening mammogram for malignant neoplasm of breast: Secondary | ICD-10-CM

## 2019-04-25 NOTE — Progress Notes (Signed)
Office Visit Note  Patient: Courtney Waters             Date of Birth: September 13, 1956           MRN: [id number]             PCP: Leanna Battles, MD Referring: Leanna Battles, MD Visit Date: 05/09/2019 Occupation: _0 @  Subjective:  Generalized pain.   History of Present Illness: Courtney Waters is a 63 y.o. female with history of fibromyalgia and osteoarthritis.  She was last seen in our office in October. She takes Cymbalta 60 mg daily. She still has discomfort in her neck and shoulders.  She continues to receive massage therapy every 5 to 6 weeks which she finds helpful. She gets about 9 hours a sleep at night.  She states social distancing has helped her manage stress better. She has discomfort in bilateral hands due to osteoarthritis.  She is taking Celebrex 200 mg daily prescribed by Dr. Ernestina Patches. She takes Prilosec along with Celebrex to reduce risk of GI bleed.  Activities of Daily Living:  Patient reports morning stiffness for 45 minutes.   Patient Denies nocturnal pain.  Difficulty dressing/grooming: Denies Difficulty climbing stairs: Denies Difficulty getting out of chair: Denies Difficulty using hands for taps, buttons, cutlery, and/or writing: Reports   Review of systems:  Is remarkable for fatigue, heartburn, joint pain, depression, insomnia and myalgia.  There was no history of oral ulcers, nasal ulcers, malar rash, photosensitivity, dry eyes or dry mouth.  There is no history of chest pain, palpitations shortness of breath, lymphadenopathy, paresthesias.   PMFS History:  Patient Active Problem List   Diagnosis Date Noted  . Primary osteoarthritis of both knees 07/25/2017  . Screening for osteoporosis 07/25/2017  . DDD (degenerative disc disease), lumbar 07/25/2017  . Primary osteoarthritis of both hands 01/26/2017  . Primary osteoarthritis of both feet 01/26/2017  . Other idiopathic scoliosis, lumbar region 01/26/2017  . Fibromyalgia 02/05/2015  .  METATARSALGIA 08/14/2007  . BUNIONS, BILATERAL 08/14/2007  . UNEQUAL LEG LENGTH, ACQUIRED 08/14/2007  . HEEL PAIN, LEFT 08/08/2007  . Osteopenia 08/08/2007    Past Medical History:  Diagnosis Date  . Acid reflux   . Arthritis    hands and neck  . Back pain 12/01/1998   compression fracture L1 from sledding accident  . Depression   . Fibromyalgia   . Hearing loss in right ear   . Hiatal hernia 11/2017  . MVP (mitral valve prolapse)    Hx  . SVD (spontaneous vaginal delivery)    x 2    Family History  Problem Relation Age of Onset  . Diabetes Mother   . Hearing loss Mother   . Cancer Father        GI  . Heart disease Sister   . Hearing loss Brother   . Breast cancer Maternal Aunt   . Ovarian cancer Other        MGM, mid 92's   Past Surgical History:  Procedure Laterality Date  . BUNIONECTOMY Left 10/16/14   Dr. Durward Fortes  . COLONOSCOPY    . 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 LAPAROSCOPIC BILATERAL SALPINGO OOPHERECTOMY Bilateral 05/13/2014   Procedure: LAPAROSCOPIC BILATERAL SALPINGO OOPHORECTOMY;  Surgeon: Lyman Speller, MD;  Location: [facility];  Service: Gynecology;  Laterality: Bilateral;  . toe nail removal Left 03/2018  . TONSILLECTOMY  age  3  . WISDOM TOOTH EXTRACTION  age 21   Social History   Social History Narrative  . Not on file   Immunization History  Administered Date(s) Administered  . Tdap 01/31/2017     Objective: Vital Signs: BP 139/87 (BP Location: Left Arm, Patient Position: Sitting, Cuff Size: Small)   Pulse 89   Resp 12   Ht 5' 7.5" (1.715 m)   Wt 144 lb (65.3 kg)   LMP 11/07/2009 Comment: spotting x 2days  BMI 22.22 kg/m    Physical Exam Vitals signs and nursing note reviewed.  Constitutional:      Appearance: She is well-developed.  HENT:     Head: Normocephalic and atraumatic.  Eyes:     Conjunctiva/sclera: Conjunctivae normal.   Neck:     Musculoskeletal: Normal range of motion.  Cardiovascular:     Rate and Rhythm: Normal rate and regular rhythm.     Heart sounds: Normal heart sounds.  Pulmonary:     Effort: Pulmonary effort is normal.     Breath sounds: Normal breath sounds.  Abdominal:     General: Bowel sounds are normal.     Palpations: Abdomen is soft.  Lymphadenopathy:     Cervical: No cervical adenopathy.  Skin:    General: Skin is warm and dry.     Capillary Refill: Capillary refill takes less than 2 seconds.  Neurological:     Mental Status: She is alert and oriented to person, place, and time.  Psychiatric:        Behavior: Behavior normal.      Musculoskeletal Exam: C-spine thoracic and lumbar spine were in good range of motion.  She has some discomfort range of motion of her lumbar spine.  Shoulder joints, elbow joints, wrist joints, MCPs PIPs DIPs were in good range of motion with some osteoarthritic changes in PIP and DIPs.  Hip joints with good range of motion.  She had tenderness over bilateral trochanteric bursa.  Knee joints had some crepitus but no warmth swelling or effusion.  She has some osteoarthritic changes in her feet.  She had positive tender points and hyperalgesia.  CDAI Exam: CDAI Score: - Patient Global: -; Provider Global: - Swollen: -; Tender: - Joint Exam   No joint exam has been documented for this visit   There is currently no information documented on the homunculus. Go to the Rheumatology activity and complete the homunculus joint exam.  Investigation: No additional findings.  Imaging: Mm 3d Screen Breast Bilateral  Result Date: 05/09/2019 CLINICAL DATA:  Screening. EXAM: DIGITAL SCREENING BILATERAL MAMMOGRAM WITH TOMO AND CAD COMPARISON:  Previous exam(s). ACR Breast Density Category c: The breast tissue is heterogeneously dense, which may obscure small masses. FINDINGS: There are no findings suspicious for malignancy. Images were processed with CAD.  IMPRESSION: No mammographic evidence of malignancy. A result letter of this screening mammogram will be mailed directly to the patient. RECOMMENDATION: Screening mammogram in one year. (Code:SM-B-01Y) BI-RADS CATEGORY  1: Negative. Electronically Signed   By: Daniel  Boyle M.D.   On: 05/09/2019 14:04    Recent Labs: Lab Results  Component Value Date   WBC 5.6 01/31/2017   HGB 12.1 01/31/2017   PLT 390 01/31/2017   NA 139 02/07/2017   K 4.6 02/07/2017   CL 105 02/07/2017   CO2 23 02/07/2017   GLUCOSE 85 02/07/2017   BUN 15 02/07/2017   CREATININE 0.92 02/07/2017   BILITOT 0.5 02/07/2017   ALKPHOS 55 02/07/2017   AST   16 02/07/2017   ALT 10 02/07/2017   PROT 6.6 02/07/2017   ALBUMIN 3.9 02/07/2017   CALCIUM 9.5 02/07/2017   GFRAA  10/07/2009    >60        The eGFR has been calculated using the MDRD equation. This calculation has not been validated in all clinical situations. eGFR's persistently <60 mL/min signify possible Chronic Kidney Disease.    Speciality Comments: No specialty comments available.  Procedures:  No procedures performed Allergies: Nsaids   Assessment / Plan:     Visit Diagnoses: Fibromyalgia -patient is on Cymbalta which helps her symptoms.- Plan: DULoxetine (CYMBALTA) 60 MG capsule, refill was given today.  Primary osteoarthritis of both hands - Plan: Joint protection was discussed.  Primary osteoarthritis of both knees - Plan: Need for regular exercise was emphasized.  Primary osteoarthritis of both feet - Plan: She is currently not having much discomfort.  DDD (degenerative disc disease), lumbar - Plan: She has chronic pain.  She is will resume water aerobics which will be helpful.  Other chronic pain - She is on oxycodone by pain clinic   Osteopenia of multiple sites - DEXA has been monitored by her PCP. - Plan: She is on calcium and vitamin D.  Other fatigue -related to insomnia.  History of gastroesophageal reflux (GERD)  Other  idiopathic scoliosis, lumbar region    Orders: No orders of the defined types were placed in this encounter.  Meds ordered this encounter  Medications  . DULoxetine (CYMBALTA) 60 MG capsule    Sig: TAKE 1 CAPSULE BY MOUTH EVERY DAY    Dispense:  30 capsule    Refill:  2      Follow-Up Instructions: Return in about 6 months (around 11/09/2019) for Osteoarthritis, FMS.   Bo Merino, MD  Note - This record has been created using Editor, commissioning.  Chart creation errors have been sought, but may not always  have been located. Such creation errors do not reflect on  the standard of medical care.

## 2019-05-08 ENCOUNTER — Other Ambulatory Visit: Payer: Self-pay

## 2019-05-08 ENCOUNTER — Ambulatory Visit
Admission: RE | Admit: 2019-05-08 | Discharge: 2019-05-08 | Disposition: A | Discharge status: Home / Self Care | Payer: Medicare Other | Source: Ambulatory Visit | Attending: Internal Medicine | Admitting: Internal Medicine

## 2019-05-08 DIAGNOSIS — Z1231 Encounter for screening mammogram for malignant neoplasm of breast: Secondary | ICD-10-CM

## 2019-05-09 ENCOUNTER — Ambulatory Visit (INDEPENDENT_AMBULATORY_CARE_PROVIDER_SITE_OTHER): Payer: Medicare Other | Admitting: Rheumatology

## 2019-05-09 ENCOUNTER — Encounter: Payer: Self-pay | Admitting: Rheumatology

## 2019-05-09 ENCOUNTER — Other Ambulatory Visit: Payer: Self-pay

## 2019-05-09 ENCOUNTER — Other Ambulatory Visit: Payer: Self-pay | Admitting: Rheumatology

## 2019-05-09 VITALS — BP 139/87 | HR 89 | Resp 12 | Ht 67.5 in | Wt 144.0 lb

## 2019-05-09 DIAGNOSIS — M797 Fibromyalgia: Secondary | ICD-10-CM

## 2019-05-09 DIAGNOSIS — M19072 Primary osteoarthritis, left ankle and foot: Secondary | ICD-10-CM | POA: Diagnosis not present

## 2019-05-09 DIAGNOSIS — R5383 Other fatigue: Secondary | ICD-10-CM | POA: Diagnosis not present

## 2019-05-09 DIAGNOSIS — M4126 Other idiopathic scoliosis, lumbar region: Secondary | ICD-10-CM | POA: Diagnosis not present

## 2019-05-09 DIAGNOSIS — M19041 Primary osteoarthritis, right hand: Secondary | ICD-10-CM

## 2019-05-09 DIAGNOSIS — M19071 Primary osteoarthritis, right ankle and foot: Secondary | ICD-10-CM

## 2019-05-09 DIAGNOSIS — M19042 Primary osteoarthritis, left hand: Secondary | ICD-10-CM | POA: Diagnosis not present

## 2019-05-09 DIAGNOSIS — G8929 Other chronic pain: Secondary | ICD-10-CM

## 2019-05-09 DIAGNOSIS — M170 Bilateral primary osteoarthritis of knee: Secondary | ICD-10-CM | POA: Diagnosis not present

## 2019-05-09 DIAGNOSIS — M5136 Other intervertebral disc degeneration, lumbar region: Secondary | ICD-10-CM

## 2019-05-09 DIAGNOSIS — Z8719 Personal history of other diseases of the digestive system: Secondary | ICD-10-CM

## 2019-05-09 DIAGNOSIS — M8589 Other specified disorders of bone density and structure, multiple sites: Secondary | ICD-10-CM

## 2019-05-09 DIAGNOSIS — M51369 Other intervertebral disc degeneration, lumbar region without mention of lumbar back pain or lower extremity pain: Secondary | ICD-10-CM

## 2019-05-09 MED ORDER — DULOXETINE HCL 60 MG PO CPEP: ORAL_CAPSULE | ORAL | 2 refills | Status: DC

## 2019-05-17 ENCOUNTER — Other Ambulatory Visit: Payer: Self-pay

## 2019-05-17 ENCOUNTER — Ambulatory Visit: Payer: BLUE CROSS/BLUE SHIELD | Admitting: Obstetrics & Gynecology

## 2019-05-17 ENCOUNTER — Encounter

## 2019-05-21 ENCOUNTER — Other Ambulatory Visit: Payer: Self-pay

## 2019-05-21 ENCOUNTER — Ambulatory Visit (INDEPENDENT_AMBULATORY_CARE_PROVIDER_SITE_OTHER): Payer: Medicare Other | Admitting: Obstetrics & Gynecology

## 2019-05-21 ENCOUNTER — Other Ambulatory Visit (HOSPITAL_COMMUNITY)
Admission: RE | Admit: 2019-05-21 | Discharge: 2019-05-21 | Disposition: A | Discharge status: Home / Self Care | Payer: Medicare Other | Source: Ambulatory Visit | Attending: Obstetrics & Gynecology | Admitting: Obstetrics & Gynecology

## 2019-05-21 ENCOUNTER — Encounter: Payer: Self-pay | Admitting: Obstetrics & Gynecology

## 2019-05-21 VITALS — BP 110/70 | HR 92 | Temp 97.3°F | Ht 66.5 in | Wt 140.8 lb

## 2019-05-21 DIAGNOSIS — Z01419 Encounter for gynecological examination (general) (routine) without abnormal findings: Secondary | ICD-10-CM

## 2019-05-21 DIAGNOSIS — Z124 Encounter for screening for malignant neoplasm of cervix: Secondary | ICD-10-CM | POA: Insufficient documentation

## 2019-05-21 DIAGNOSIS — Z862 Personal history of diseases of the blood and blood-forming organs and certain disorders involving the immune mechanism: Secondary | ICD-10-CM

## 2019-05-21 DIAGNOSIS — R7989 Other specified abnormal findings of blood chemistry: Secondary | ICD-10-CM | POA: Diagnosis not present

## 2019-05-21 DIAGNOSIS — M259 Joint disorder, unspecified: Secondary | ICD-10-CM | POA: Diagnosis not present

## 2019-05-21 DIAGNOSIS — R5382 Chronic fatigue, unspecified: Secondary | ICD-10-CM | POA: Diagnosis not present

## 2019-05-21 DIAGNOSIS — K5900 Constipation, unspecified: Secondary | ICD-10-CM | POA: Diagnosis not present

## 2019-05-21 DIAGNOSIS — Z86010 Personal history of colonic polyps: Secondary | ICD-10-CM | POA: Diagnosis not present

## 2019-05-21 DIAGNOSIS — Z0000 Encounter for general adult medical examination without abnormal findings: Secondary | ICD-10-CM | POA: Diagnosis not present

## 2019-05-21 DIAGNOSIS — K219 Gastro-esophageal reflux disease without esophagitis: Secondary | ICD-10-CM | POA: Diagnosis not present

## 2019-05-21 DIAGNOSIS — Z1211 Encounter for screening for malignant neoplasm of colon: Secondary | ICD-10-CM | POA: Diagnosis not present

## 2019-05-21 DIAGNOSIS — E2839 Other primary ovarian failure: Secondary | ICD-10-CM | POA: Diagnosis not present

## 2019-05-21 DIAGNOSIS — M899 Disorder of bone, unspecified: Secondary | ICD-10-CM

## 2019-05-21 DIAGNOSIS — K625 Hemorrhage of anus and rectum: Secondary | ICD-10-CM | POA: Diagnosis not present

## 2019-05-21 NOTE — Patient Instructions (Signed)
Plan bone density with mammogram in 2021.

## 2019-05-21 NOTE — Progress Notes (Signed)
63 y.o. G67P2002 Married White or Caucasian female here for annual exam.  Doing well.  Denies vaginal bleeding.    Followed by Dr. Estanislado Pandy.  She is followed by fibromyalgia.  Sleep study was recommended.  She has not done this yet.    Continuing to have some reflux issues.  Is followed by Dr. Collene Mares.  PCP:  Dr. Sharlett Iles.    Patient's last menstrual period was 11/07/2009.          Sexually active: Yes.    The current method of family planning is post menopausal status.     Exercising: Yes.    yard work, house work  Smoker:  no  Health Maintenance: Pap:  01/31/17 neg. HR HPV:neg   01/19/15 Neg  History of abnormal Pap:  no MMG:  05/08/19 BIRADS1:neg Colonoscopy:  07/07/14 normal.  Due this year.  Has follow-up with Dr. Collene Mares this afternoon. BMD:   01/17/13 Normal  TDaP:  2018 Pneumonia vaccine(s):  No Shingrix:   Have discussed in the past Hep C testing: 01/25/16 neg  Screening Labs: PCP    reports that she has never smoked. She has never used smokeless tobacco. She reports current alcohol use. She reports that she does not use drugs.  Past Medical History:  Diagnosis Date  . Acid reflux   . Arthritis    hands and neck  . Back pain 12/01/1998   compression fracture L1 from sledding accident  . Depression   . Fibromyalgia   . Hearing loss in right ear   . Hiatal hernia 11/2017  . MVP (mitral valve prolapse)    Hx  . SVD (spontaneous vaginal delivery)    x 2    Past Surgical History:  Procedure Laterality Date  . BUNIONECTOMY Left 10/16/14   Dr. Durward Fortes  . COLONOSCOPY    . 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 LAPAROSCOPIC BILATERAL SALPINGO OOPHERECTOMY Bilateral 05/13/2014   Procedure: LAPAROSCOPIC BILATERAL SALPINGO OOPHORECTOMY;  Surgeon: Lyman Speller, MD;  Location: [facility];  Service: Gynecology;  Laterality: Bilateral;  . toe nail removal Left 03/2018  . TONSILLECTOMY  age 70   . WISDOM TOOTH EXTRACTION  age 83    Current Outpatient Medications  Medication Sig Dispense Refill  . Biotin 5000 MCG CAPS Take 1 capsule by mouth daily.    . Boswellia-Glucosamine-Vit D (OSTEO BI-FLEX ONE PER DAY PO) Take by mouth daily.    Marland Kitchen CALCIUM PO Take by mouth. With magnesium and zinc    . celecoxib (CELEBREX) 200 MG capsule Take 1 capsule (200 mg total) by mouth daily. 90 capsule 3  . Cholecalciferol (VITAMIN D) 2000 UNITS CAPS Take 1 capsule by mouth daily. 5,000 IU    . DULoxetine (CYMBALTA) 60 MG capsule TAKE 1 CAPSULE BY MOUTH EVERY DAY 30 capsule 2  . MAGNESIUM MALATE PO Take 250 mg by mouth daily.    Marland Kitchen omeprazole (PRILOSEC) 20 MG capsule Take 20 mg by mouth daily.    . TURMERIC PO Take by mouth.    . oxyCODONE-acetaminophen (PERCOCET/ROXICET) 5-325 MG per tablet Take 1-2 tablets by mouth every 4 (four) hours as needed for moderate pain. (Patient not taking: Reported on 05/21/2019) 30 tablet 0   No current facility-administered medications for this visit.     Family History  Problem Relation Age of Onset  . Diabetes Mother   . Hearing loss Mother   .  Cancer Father        GI  . Heart disease Sister   . Hearing loss Brother   . Breast cancer Maternal Aunt   . Ovarian cancer Other        MGM, mid 56's    Review of Systems  Genitourinary:       Axilla nodule   All other systems reviewed and are negative.   Exam:   BP 110/70   Pulse 92   Temp (!) 97.3 F (36.3 C) (Temporal)   Ht 5' 6.5" (1.689 m)   Wt 140 lb 12.8 oz (63.9 kg)   LMP 11/07/2009 Comment: spotting x 2days  BMI 22.39 kg/m   Height: 5' 6.5" (168.9 cm)  Ht Readings from Last 3 Encounters:  05/21/19 5' 6.5" (1.689 m)  05/09/19 5' 7.5" (1.715 m)  08/23/18 5' 7.5" (1.715 m)    General appearance: alert, cooperative and appears stated age Head: Normocephalic, without obvious abnormality, atraumatic Neck: no adenopathy, supple, symmetrical, trachea midline and thyroid normal to inspection and  palpation Lungs: clear to auscultation bilaterally Breasts: normal appearance, no masses or tenderness, 1cm erythematous nodule c/w occluded gland Heart: regular rate and rhythm Abdomen: soft, non-tender; bowel sounds normal; no masses,  no organomegaly Extremities: extremities normal, atraumatic, no cyanosis or edema Skin: Skin color, texture, turgor normal. No rashes or lesions Lymph nodes: Cervical, supraclavicular, and axillary nodes normal. No abnormal inguinal nodes palpated Neurologic: Grossly normal   Pelvic: External genitalia:  no lesions              Urethra:  normal appearing urethra with no masses, tenderness or lesions              Bartholins and Skenes: normal                 Vagina: normal appearing vagina with normal color and discharge, no lesions              Cervix: no lesions              Pap taken: Yes.   Bimanual Exam:  Uterus:  normal size, contour, position, consistency, mobility, non-tender              Adnexa: normal adnexa and no mass, fullness, tenderness               Rectovaginal: Confirms               Anus:  normal sphincter tone, no lesions  Chaperone was present for exam.  A:  Well Woman with normal exam PMP, no HRT Fibromyalgia H/o chronic back pain followed by [facility] (hasn't been seen for several months due to weaning off pain medication) Axillary nodule c/w dermatologic cause S/p laparoscopic BSO 7/15  P:   Mammogram guidelines reviewed.  Doing yearly. Pt will let me know if her axillary lesion does not resolve.  Would proceed with imaging.   pap smear obtained today Lab work orders placed.  She will return for this for fasting lab work Return annually or prn

## 2019-05-22 ENCOUNTER — Other Ambulatory Visit (INDEPENDENT_AMBULATORY_CARE_PROVIDER_SITE_OTHER): Payer: Medicare Other

## 2019-05-22 ENCOUNTER — Telehealth: Payer: Self-pay | Admitting: *Deleted

## 2019-05-22 DIAGNOSIS — Z862 Personal history of diseases of the blood and blood-forming organs and certain disorders involving the immune mechanism: Secondary | ICD-10-CM

## 2019-05-22 DIAGNOSIS — R5382 Chronic fatigue, unspecified: Secondary | ICD-10-CM

## 2019-05-22 DIAGNOSIS — M259 Joint disorder, unspecified: Secondary | ICD-10-CM

## 2019-05-22 DIAGNOSIS — Z0000 Encounter for general adult medical examination without abnormal findings: Secondary | ICD-10-CM

## 2019-05-22 DIAGNOSIS — M899 Disorder of bone, unspecified: Secondary | ICD-10-CM | POA: Diagnosis not present

## 2019-05-22 LAB — CYTOLOGY - PAP: Diagnosis: NEGATIVE

## 2019-05-22 NOTE — Telephone Encounter (Signed)
Patient in office for lab appt. CBC, TSH, CMP, Vit D and Lipid panel ordered.   Patient states she was seen by Dr. Collene Mares on 05/20/19 and CBC, TSH, T3, T4, BMP and hepatic panel collected. Patient does not want to duplicate labs. Will proceed with CMP, Vit D and lipid panel. Patient will have copy of labs faxed to Dr. Sabra Heck once resulted.   Advised patient I will update Dr. Sabra Heck, our office will return call if any additional recommendations.   Routing to provider for final review. Patient is agreeable to disposition. Will close encounter.

## 2019-05-23 LAB — COMPREHENSIVE METABOLIC PANEL
ALT: 11 IU/L (ref 0–32)
AST: 19 IU/L (ref 0–40)
Albumin/Globulin Ratio: 2.1 (ref 1.2–2.2)
Albumin: 4.1 g/dL (ref 3.8–4.8)
Alkaline Phosphatase: 49 IU/L (ref 39–117)
BUN/Creatinine Ratio: 19 (ref 12–28)
BUN: 18 mg/dL (ref 8–27)
Bilirubin Total: 0.4 mg/dL (ref 0.0–1.2)
CO2: 24 mmol/L (ref 20–29)
Calcium: 9.6 mg/dL (ref 8.7–10.3)
Chloride: 105 mmol/L (ref 96–106)
Creatinine, Ser: 0.94 mg/dL (ref 0.57–1.00)
GFR calc Af Amer: 74 mL/min/{1.73_m2} (ref >59)
GFR calc non Af Amer: 64 mL/min/{1.73_m2} (ref >59)
Globulin, Total: 2 g/dL (ref 1.5–4.5)
Glucose: 92 mg/dL (ref 65–99)
Potassium: 4.4 mmol/L (ref 3.5–5.2)
Sodium: 141 mmol/L (ref 134–144)
Total Protein: 6.1 g/dL (ref 6.0–8.5)

## 2019-05-23 LAB — LIPID PANEL
Chol/HDL Ratio: 2.5 ratio (ref 0.0–4.4)
Cholesterol, Total: 199 mg/dL (ref 100–199)
HDL: 81 mg/dL (ref >39)
LDL Calculated: 104 mg/dL — ABNORMAL HIGH (ref 0–99)
Triglycerides: 68 mg/dL (ref 0–149)
VLDL Cholesterol Cal: 14 mg/dL (ref 5–40)

## 2019-05-23 LAB — VITAMIN D 25 HYDROXY (VIT D DEFICIENCY, FRACTURES): Vit D, 25-Hydroxy: 69.4 ng/mL (ref 30.0–100.0)

## 2019-07-10 DIAGNOSIS — K449 Diaphragmatic hernia without obstruction or gangrene: Secondary | ICD-10-CM | POA: Diagnosis not present

## 2019-07-10 DIAGNOSIS — D123 Benign neoplasm of transverse colon: Secondary | ICD-10-CM | POA: Diagnosis not present

## 2019-07-10 DIAGNOSIS — Z86010 Personal history of colonic polyps: Secondary | ICD-10-CM | POA: Diagnosis not present

## 2019-07-10 DIAGNOSIS — K635 Polyp of colon: Secondary | ICD-10-CM | POA: Diagnosis not present

## 2019-07-10 DIAGNOSIS — K219 Gastro-esophageal reflux disease without esophagitis: Secondary | ICD-10-CM | POA: Diagnosis not present

## 2019-07-10 DIAGNOSIS — Z1211 Encounter for screening for malignant neoplasm of colon: Secondary | ICD-10-CM | POA: Diagnosis not present

## 2019-07-22 ENCOUNTER — Other Ambulatory Visit: Payer: Self-pay | Admitting: Gastroenterology

## 2019-07-22 DIAGNOSIS — R1310 Dysphagia, unspecified: Secondary | ICD-10-CM

## 2019-07-29 ENCOUNTER — Ambulatory Visit
Admission: RE | Admit: 2019-07-29 | Discharge: 2019-07-29 | Disposition: A | Discharge status: Home / Self Care | Payer: Medicare Other | Source: Ambulatory Visit | Attending: Gastroenterology | Admitting: Gastroenterology

## 2019-07-29 DIAGNOSIS — K449 Diaphragmatic hernia without obstruction or gangrene: Secondary | ICD-10-CM | POA: Diagnosis not present

## 2019-07-29 DIAGNOSIS — R1310 Dysphagia, unspecified: Secondary | ICD-10-CM

## 2019-07-30 DIAGNOSIS — H02824 Cysts of left upper eyelid: Secondary | ICD-10-CM | POA: Diagnosis not present

## 2019-08-13 ENCOUNTER — Other Ambulatory Visit: Payer: Self-pay | Admitting: Rheumatology

## 2019-08-13 DIAGNOSIS — M797 Fibromyalgia: Secondary | ICD-10-CM

## 2019-08-13 NOTE — Telephone Encounter (Signed)
Last Visit: 05/09/19 Next Visit: 11/12/19  Okay to refill per Dr. Estanislado Pandy

## 2019-08-27 DIAGNOSIS — H52203 Unspecified astigmatism, bilateral: Secondary | ICD-10-CM | POA: Diagnosis not present

## 2019-08-27 DIAGNOSIS — H18513 Endothelial corneal dystrophy, bilateral: Secondary | ICD-10-CM | POA: Diagnosis not present

## 2019-08-27 DIAGNOSIS — H2513 Age-related nuclear cataract, bilateral: Secondary | ICD-10-CM | POA: Diagnosis not present

## 2019-08-27 DIAGNOSIS — H43813 Vitreous degeneration, bilateral: Secondary | ICD-10-CM | POA: Diagnosis not present

## 2019-08-31 ENCOUNTER — Other Ambulatory Visit (INDEPENDENT_AMBULATORY_CARE_PROVIDER_SITE_OTHER): Payer: Self-pay | Admitting: Physical Medicine and Rehabilitation

## 2019-09-24 DIAGNOSIS — M545 Low back pain: Secondary | ICD-10-CM | POA: Diagnosis not present

## 2019-09-24 DIAGNOSIS — M797 Fibromyalgia: Secondary | ICD-10-CM | POA: Diagnosis not present

## 2019-09-24 DIAGNOSIS — M79641 Pain in right hand: Secondary | ICD-10-CM | POA: Diagnosis not present

## 2019-09-24 DIAGNOSIS — M542 Cervicalgia: Secondary | ICD-10-CM | POA: Diagnosis not present

## 2019-09-24 DIAGNOSIS — R12 Heartburn: Secondary | ICD-10-CM | POA: Diagnosis not present

## 2019-11-07 ENCOUNTER — Encounter

## 2019-11-12 ENCOUNTER — Ambulatory Visit: Payer: Medicare Other | Admitting: Rheumatology

## 2019-11-23 ENCOUNTER — Other Ambulatory Visit: Payer: Self-pay | Admitting: Rheumatology

## 2019-11-23 DIAGNOSIS — M797 Fibromyalgia: Secondary | ICD-10-CM

## 2019-11-25 NOTE — Telephone Encounter (Signed)
Last Visit: 05/09/19 Next Visit: 01/30/20  Okay to refill per Dr. Estanislado Pandy

## 2020-01-22 NOTE — Progress Notes (Signed)
Office Visit Note  Patient: Courtney Waters             Date of Birth: 1955/11/27           MRN: [id number]             PCP: Leanna Battles, MD Referring: Leanna Battles, MD Visit Date: 01/30/2020 Occupation: @GUAROCC @  Subjective:  Generalized pain  History of Present Illness: Courtney Waters is a 64 y.o. female with history of fibromyalgia, osteoarthritis, and DDD.  Patient reports that she has been having more frequent and severe fibromyalgia flares over the past several months.  She states that she used to go to water aerobics on a weekly basis but discontinued during the pandemic.  She reports she has been more sedentary recently which she feels is contributing to her increased pain and fatigue.  She has been sleeping about 9 hours per night and still experiences daytime drowsiness.  She has a massage once a month which helps with her myofascial pain temporarily.  She continues to take Cymbalta 60 mg 1 capsule daily.  She has been experiencing increased discomfort in both knee joints, both CMC joints, and her right heel.  She states that she has history of plantar fasciitis but the discomfort is very different.  She states that she will experience a sharp fleeting pain at the base of her heel.  She has been able to walk for exercise due to the discomfort. She is taking Etodolac 400 mg daily, which was started in January 2021.  She discontinued celebrex and has not noticed much improvement.    Activities of Daily Living:  Patient reports joint stiffness.   Patient Denies nocturnal pain.  Difficulty dressing/grooming: Denies Difficulty climbing stairs: Denies Difficulty getting out of chair: Denies Difficulty using hands for taps, buttons, cutlery, and/or writing: Denies  Review of Systems  Constitutional: Positive for fatigue.  HENT: Negative for mouth sores, mouth dryness and nose dryness.   Eyes: Positive for dryness. Negative for pain and visual disturbance.    Respiratory: Negative for cough, hemoptysis, shortness of breath and difficulty breathing.   Cardiovascular: Negative for chest pain, palpitations, hypertension and swelling in legs/feet.  Gastrointestinal: Negative for blood in stool, constipation and diarrhea.  Endocrine: Negative for increased urination.  Genitourinary: Negative for difficulty urinating and painful urination.  Musculoskeletal: Positive for arthralgias, joint pain, myalgias, muscle weakness, morning stiffness, muscle tenderness and myalgias. Negative for joint swelling.  Skin: Negative for color change, pallor, rash, hair loss, nodules/bumps, redness, skin tightness, ulcers and sensitivity to sunlight.  Allergic/Immunologic: Negative for susceptible to infections.  Neurological: Negative for dizziness, numbness and headaches.  Hematological: Negative for swollen glands.  Psychiatric/Behavioral: Negative for depressed mood, confusion and sleep disturbance. The patient is not nervous/anxious.     PMFS History:  Patient Active Problem List   Diagnosis Date Noted  . Primary osteoarthritis of both knees 07/25/2017  . Screening for osteoporosis 07/25/2017  . DDD (degenerative disc disease), lumbar 07/25/2017  . Primary osteoarthritis of both hands 01/26/2017  . Primary osteoarthritis of both feet 01/26/2017  . Other idiopathic scoliosis, lumbar region 01/26/2017  . Fibromyalgia 02/05/2015  . METATARSALGIA 08/14/2007  . BUNIONS, BILATERAL 08/14/2007  . UNEQUAL LEG LENGTH, ACQUIRED 08/14/2007  . HEEL PAIN, LEFT 08/08/2007  . Osteopenia 08/08/2007    Past Medical History:  Diagnosis Date  . Acid reflux   . Arthritis    hands and neck  . Back pain 12/01/1998   compression fracture L1  from sledding accident  . Depression   . Fibromyalgia   . Hearing loss in right ear   . Hiatal hernia 11/2017  . MVP (mitral valve prolapse)    Hx  . SVD (spontaneous vaginal delivery)    x 2    Family History  Problem Relation Age  of Onset  . Diabetes Mother   . Hearing loss Mother   . Cancer Father        GI  . Heart disease Sister   . Hearing loss Brother   . Breast cancer Maternal Aunt   . Ovarian cancer Other        MGM, mid 48's   Past Surgical History:  Procedure Laterality Date  . BUNIONECTOMY Left 10/16/14   Dr. Durward Fortes  . COLONOSCOPY    . 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 LAPAROSCOPIC BILATERAL SALPINGO OOPHERECTOMY Bilateral 05/13/2014   Procedure: LAPAROSCOPIC BILATERAL SALPINGO OOPHORECTOMY;  Surgeon: Lyman Speller, MD;  Location: [facility];  Service: Gynecology;  Laterality: Bilateral;  . toe nail removal Left 03/2018  . TONSILLECTOMY  age 55  . WISDOM TOOTH EXTRACTION  age 65   Social History   Social History Narrative  . Not on file   Immunization History  Administered Date(s) Administered  . Influenza,inj,Quad PF,6+ Mos 07/23/2019  . PFIZER SARS-COV-2 Vaccination 12/19/2019, 01/13/2020  . Tdap 01/31/2017     Objective: Vital Signs: BP 127/90 (BP Location: Left Arm, Patient Position: Sitting, Cuff Size: Normal)   Pulse 89   Resp 14   Ht 5' 7.5" (1.715 m)   Wt 146 lb 6.4 oz (66.4 kg)   LMP 11/07/2009   BMI 22.59 kg/m    Physical Exam Vitals and nursing note reviewed.  Constitutional:      Appearance: She is well-developed.  HENT:     Head: Normocephalic and atraumatic.  Eyes:     Conjunctiva/sclera: Conjunctivae normal.  Pulmonary:     Effort: Pulmonary effort is normal.  Abdominal:     General: Bowel sounds are normal.     Palpations: Abdomen is soft.  Musculoskeletal:     Cervical back: Normal range of motion.  Lymphadenopathy:     Cervical: No cervical adenopathy.  Skin:    General: Skin is warm and dry.     Capillary Refill: Capillary refill takes less than 2 seconds.  Neurological:     Mental Status: She is alert and oriented to person, place, and time.  Psychiatric:         Behavior: Behavior normal.      Musculoskeletal Exam: Generalized hyperalgesia and positive tender points on exam.  C-spine has good range of motion on exam.  She has trapezius muscle tension and muscle tenderness bilaterally.  Thoracic and lumbar spine have good range of motion.  Shoulder joints, with joints, wrist joints, MCPs, PIPs, DIPs good range of motion with no synovitis.  She has CMC joint thickening bilaterally.  Hip joints have good range of motion with no discomfort.  Knee joints have good range of motion with no warmth or effusion.  Ankle joints have good range of motion no tenderness or inflammation.  She has tenderness on the posterior aspect of the right heel.  CDAI Exam: CDAI Score: -- Patient Global: --; Provider Global: -- Swollen: --; Tender: -- Joint Exam 01/30/2020   No joint exam has been documented for this visit   There  is currently no information documented on the homunculus. Go to the Rheumatology activity and complete the homunculus joint exam.  Investigation: No additional findings.  Imaging: XR Foot 2 Views Right  Result Date: 01/30/2020 First MTP narrowing, PIP and DIP narrowing was noted.  Dorsal spurring was noted.  Inferior and posterior calcaneal spurs were noted.  No erosive changes were noted. Impression: These findings are consistent with osteoarthritis of the foot.   Recent Labs: Lab Results  Component Value Date   WBC 5.6 01/31/2017   HGB 12.1 01/31/2017   PLT 390 01/31/2017   NA 141 05/22/2019   K 4.4 05/22/2019   CL 105 05/22/2019   CO2 24 05/22/2019   GLUCOSE 92 05/22/2019   BUN 18 05/22/2019   CREATININE 0.94 05/22/2019   BILITOT 0.4 05/22/2019   ALKPHOS 49 05/22/2019   AST 19 05/22/2019   ALT 11 05/22/2019   PROT 6.1 05/22/2019   ALBUMIN 4.1 05/22/2019   CALCIUM 9.6 05/22/2019   GFRAA 74 05/22/2019    Speciality Comments: No specialty comments available.  Procedures:  No procedures performed Allergies: Nsaids    Assessment / Plan:     Visit Diagnoses: Fibromyalgia -She has generalized hyperalgesia and positive tender points on exam.  She continues to take Cymbalta 60 mg 1 capsule by mouth daily.  She has been experiencing more frequent and severe fibromyalgia flares.  She has generalized myalgias and muscle tenderness.  She has been more sedentary recently which has been exacerbating her discomfort and fatigue.  We discussed the importance of returning to water aerobics.  She was also encouraged to start walking and/or performing yoga for additional exercise.  We discussed the importance of good sleep hygiene.  She has been sleeping about 9 hours per night but does not feel as though her sleep is restorative.  She continues to get a massage once a month which helps with her overall myofascial pain.  The importance of good sleep hygiene and regular exercise was discussed at length.  She will follow-up in the office in 6 months.   Primary osteoarthritis of both hands: She has mild PIP and DIP thickening consistent with osteoarthritis of both hands.  She has CMC joint thickening bilaterally.  She has tenderness of the the left [facility] joint on exam.  She was encouraged to wear her left CMC joint braces.  She was also given a list of natural anti-inflammatories to start taking.   Primary osteoarthritis of both knees: She has been experiencing intermittent discomfort in both knee joints.  She is no discomfort on exam today.  She has good range of motion of both knee joints.  No warmth or effusion was noted.  She declined x-rays at this time.  Primary osteoarthritis of both feet: She experiences intermittent discomfort in the right foot at the base of her heel.  X-rays of the right foot were obtained today which revealed inferior and posterior calcaneal spurs.  We discussed the importance of wearing proper fitting shoes with heel support and cushioning.  Pain in right foot -X-rays of the right foot revealed inferior and  posterior calcaneal spurs. Plan: XR Foot 2 Views Right  Osteopenia of multiple sites - DEXA has been monitored by her gynecologist.  She is due to update DEXA.  Other fatigue: She has been experiencing increased fatigue recently.  We discussed the importance of regular exercise and good sleep hygiene.  We discussed returning to water aerobics as well as starting yoga or walking for additional exercise.  DDD (degenerative disc disease), lumbar: She experiences intermittent discomfort in her lower back.  She has no midline spinal tenderness.  She is not experiencing any symptoms of radiculopathy at this time.  Other idiopathic scoliosis, lumbar region: She experiences intermittent discomfort in her lower back.  She has no symptoms of radiculopathy at this time.  History of gastroesophageal reflux (GERD)    Orders: Orders Placed This Encounter  Procedures  . XR Foot 2 Views Right   No orders of the defined types were placed in this encounter.   Face-to-face time spent with patient was 65minutes. Greater than 50% of time was spent in counseling and coordination of care.  Follow-Up Instructions: Return in about 6 months (around 08/01/2020) for Fibromyalgia.   Ofilia Neas, PA-C   I examined and evaluated the patient with Hazel Sams PA.  Patient has been experiencing some pain in her right heel.  The x-ray obtained today was osteoarthritis.  I discussed the findings at length with the patient.  Proper fitting shoes with arch support were emphasized.  The plan of care was discussed as noted above.  Bo Merino, MD  Note - This record has been created using Editor, commissioning.  Chart creation errors have been sought, but may not always  have been located. Such creation errors do not reflect on  the standard of medical care.

## 2020-01-30 ENCOUNTER — Other Ambulatory Visit: Payer: Self-pay

## 2020-01-30 ENCOUNTER — Ambulatory Visit (INDEPENDENT_AMBULATORY_CARE_PROVIDER_SITE_OTHER): Payer: Medicare Other | Admitting: Rheumatology

## 2020-01-30 ENCOUNTER — Encounter: Payer: Self-pay | Admitting: Rheumatology

## 2020-01-30 ENCOUNTER — Ambulatory Visit: Payer: Self-pay

## 2020-01-30 VITALS — BP 127/90 | HR 89 | Resp 14 | Ht 67.5 in | Wt 146.4 lb

## 2020-01-30 DIAGNOSIS — R5383 Other fatigue: Secondary | ICD-10-CM

## 2020-01-30 DIAGNOSIS — M19042 Primary osteoarthritis, left hand: Secondary | ICD-10-CM | POA: Diagnosis not present

## 2020-01-30 DIAGNOSIS — M8589 Other specified disorders of bone density and structure, multiple sites: Secondary | ICD-10-CM | POA: Diagnosis not present

## 2020-01-30 DIAGNOSIS — Z8719 Personal history of other diseases of the digestive system: Secondary | ICD-10-CM | POA: Diagnosis not present

## 2020-01-30 DIAGNOSIS — M4126 Other idiopathic scoliosis, lumbar region: Secondary | ICD-10-CM | POA: Diagnosis not present

## 2020-01-30 DIAGNOSIS — M797 Fibromyalgia: Secondary | ICD-10-CM | POA: Diagnosis not present

## 2020-01-30 DIAGNOSIS — M19041 Primary osteoarthritis, right hand: Secondary | ICD-10-CM

## 2020-01-30 DIAGNOSIS — M19072 Primary osteoarthritis, left ankle and foot: Secondary | ICD-10-CM

## 2020-01-30 DIAGNOSIS — M79671 Pain in right foot: Secondary | ICD-10-CM

## 2020-01-30 DIAGNOSIS — M19071 Primary osteoarthritis, right ankle and foot: Secondary | ICD-10-CM | POA: Diagnosis not present

## 2020-01-30 DIAGNOSIS — G8929 Other chronic pain: Secondary | ICD-10-CM | POA: Diagnosis not present

## 2020-01-30 DIAGNOSIS — M5136 Other intervertebral disc degeneration, lumbar region: Secondary | ICD-10-CM

## 2020-01-30 DIAGNOSIS — M170 Bilateral primary osteoarthritis of knee: Secondary | ICD-10-CM | POA: Diagnosis not present

## 2020-02-18 ENCOUNTER — Other Ambulatory Visit: Payer: Self-pay | Admitting: Rheumatology

## 2020-02-18 DIAGNOSIS — H18513 Endothelial corneal dystrophy, bilateral: Secondary | ICD-10-CM | POA: Diagnosis not present

## 2020-02-18 DIAGNOSIS — M797 Fibromyalgia: Secondary | ICD-10-CM

## 2020-02-18 DIAGNOSIS — H2513 Age-related nuclear cataract, bilateral: Secondary | ICD-10-CM | POA: Diagnosis not present

## 2020-02-18 NOTE — Telephone Encounter (Signed)
Last visit: 01/30/20 Next Visit: 07/30/20  Okay to refill per Dr. Estanislado Pandy

## 2020-03-12 ENCOUNTER — Other Ambulatory Visit: Payer: Self-pay | Admitting: Obstetrics & Gynecology

## 2020-03-12 DIAGNOSIS — E2839 Other primary ovarian failure: Secondary | ICD-10-CM

## 2020-03-12 DIAGNOSIS — Z1231 Encounter for screening mammogram for malignant neoplasm of breast: Secondary | ICD-10-CM

## 2020-03-26 DIAGNOSIS — M797 Fibromyalgia: Secondary | ICD-10-CM | POA: Diagnosis not present

## 2020-03-26 DIAGNOSIS — K219 Gastro-esophageal reflux disease without esophagitis: Secondary | ICD-10-CM | POA: Diagnosis not present

## 2020-03-26 DIAGNOSIS — Z1331 Encounter for screening for depression: Secondary | ICD-10-CM | POA: Diagnosis not present

## 2020-03-26 DIAGNOSIS — M545 Low back pain: Secondary | ICD-10-CM | POA: Diagnosis not present

## 2020-03-26 DIAGNOSIS — M542 Cervicalgia: Secondary | ICD-10-CM | POA: Diagnosis not present

## 2020-05-20 ENCOUNTER — Ambulatory Visit
Admission: RE | Admit: 2020-05-20 | Discharge: 2020-05-20 | Disposition: A | Discharge status: Home / Self Care | Payer: Medicare Other | Source: Ambulatory Visit | Attending: Obstetrics & Gynecology | Admitting: Obstetrics & Gynecology

## 2020-05-20 ENCOUNTER — Ambulatory Visit: Payer: Medicare Other

## 2020-05-20 DIAGNOSIS — Z1231 Encounter for screening mammogram for malignant neoplasm of breast: Secondary | ICD-10-CM

## 2020-05-21 ENCOUNTER — Other Ambulatory Visit: Payer: Self-pay | Admitting: Rheumatology

## 2020-05-21 DIAGNOSIS — M797 Fibromyalgia: Secondary | ICD-10-CM

## 2020-05-22 NOTE — Telephone Encounter (Signed)
Last visit: 01/30/20 Next Visit: 07/30/20  Current Dose per office note on 01/30/2020: Cymbalta 60 mg 1 capsule by mouth daily.   Okay to refill per Dr. Estanislado Pandy

## 2020-07-24 NOTE — Progress Notes (Signed)
Office Visit Note  Patient: Courtney Waters             Date of Birth: 05/20/1956           MRN: [id number]             PCP: Leanna Battles, MD Referring: Leanna Battles, MD Visit Date: 07/30/2020 Occupation: @GUAROCC @  Subjective:  Medication Management (questions about current medication regimen)   History of Present Illness: Courtney Waters is a 65 y.o. female with history of fibromyalgia and osteoarthritis.  She states she has been as regards to fibromyalgia.  She states she usually sleeps around 1 AM and does not get up until 9 AM.  She states her husband is concerned about her sleeping for long hours.  [facility] a regular basis.  She does not do any other exercises.  She has some stiffness in the morning.  She has been taking Lodine XL for a long time.  She states Dr. Collene Mares does not like her taking Lodine due to GI side effects.  She has tried Tylenol which is also quite helpful.  Activities of Daily Living:  Patient reports morning stiffness for 2 hours.   Patient Reports nocturnal pain.  Difficulty dressing/grooming: Denies Difficulty climbing stairs: Denies Difficulty getting out of chair: Denies Difficulty using hands for taps, buttons, cutlery, and/or writing: Reports  Review of Systems  Constitutional: Positive for fatigue.  HENT: Negative for mouth sores, mouth dryness and nose dryness.   Eyes: Negative for pain, itching, visual disturbance and dryness.  Respiratory: Negative for cough, shortness of breath and difficulty breathing.   Cardiovascular: Negative for chest pain, palpitations and swelling in legs/feet.  Gastrointestinal: Negative for abdominal pain, blood in stool, constipation and diarrhea.  Endocrine: Negative for increased urination.  Genitourinary: Negative for difficulty urinating and painful urination.  Musculoskeletal: Positive for arthralgias, joint pain, myalgias, muscle weakness, morning  stiffness, muscle tenderness and myalgias. Negative for joint swelling.  Skin: Negative for color change, rash and redness.  Allergic/Immunologic: Negative for susceptible to infections.  Neurological: Positive for weakness. Negative for dizziness, headaches and memory loss.  Hematological: Negative for bruising/bleeding tendency and swollen glands.  Psychiatric/Behavioral: Negative for confusion and sleep disturbance.    PMFS History:  Patient Active Problem List   Diagnosis Date Noted  . Primary osteoarthritis of both knees 07/25/2017  . Screening for osteoporosis 07/25/2017  . DDD (degenerative disc disease), lumbar 07/25/2017  . Primary osteoarthritis of both hands 01/26/2017  . Primary osteoarthritis of both feet 01/26/2017  . Other idiopathic scoliosis, lumbar region 01/26/2017  . Fibromyalgia 02/05/2015  . METATARSALGIA 08/14/2007  . BUNIONS, BILATERAL 08/14/2007  . UNEQUAL LEG LENGTH, ACQUIRED 08/14/2007  . HEEL PAIN, LEFT 08/08/2007  . Osteopenia 08/08/2007    Past Medical History:  Diagnosis Date  . Acid reflux   . Arthritis    hands and neck  . Back pain 12/01/1998   compression fracture L1 from sledding accident  . Depression   . Fibromyalgia   . Hearing loss in right ear   . Hiatal hernia 11/2017  . MVP (mitral valve prolapse)    Hx  . SVD (spontaneous vaginal delivery)    x 2    Family History  Problem Relation Age of Onset  . Diabetes Mother   . Hearing loss Mother   . Cancer Father        GI  . Heart disease Sister   .  Hearing loss Brother   . Breast cancer Maternal Aunt   . Ovarian cancer Other        MGM, mid 53's   Past Surgical History:  Procedure Laterality Date  . BUNIONECTOMY Left 10/16/14   Dr. Durward Fortes  . COLONOSCOPY    . 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 LAPAROSCOPIC BILATERAL SALPINGO OOPHERECTOMY Bilateral 05/13/2014   Procedure: LAPAROSCOPIC  BILATERAL SALPINGO OOPHORECTOMY;  Surgeon: Lyman Speller, MD;  Location: [facility];  Service: Gynecology;  Laterality: Bilateral;  . toe nail removal Left 03/2018  . TONSILLECTOMY  age 35  . WISDOM TOOTH EXTRACTION  age 31   Social History   Social History Narrative  . Not on file   Immunization History  Administered Date(s) Administered  . Influenza,inj,Quad PF,6+ Mos 07/23/2019  . PFIZER SARS-COV-2 Vaccination 12/19/2019, 01/13/2020  . Tdap 01/31/2017     Objective: Vital Signs: BP 123/87 (BP Location: Left Arm, Patient Position: Sitting, Cuff Size: Small)   Pulse 88   Resp 14   Ht 5' 6.75" (1.695 m)   Wt 144 lb 12.8 oz (65.7 kg)   LMP 11/07/2009   BMI 22.85 kg/m    Physical Exam Vitals and nursing note reviewed.  Constitutional:      Appearance: She is well-developed.  HENT:     Head: Normocephalic and atraumatic.  Eyes:     Conjunctiva/sclera: Conjunctivae normal.  Cardiovascular:     Rate and Rhythm: Normal rate and regular rhythm.     Heart sounds: Normal heart sounds.  Pulmonary:     Effort: Pulmonary effort is normal.     Breath sounds: Normal breath sounds.  Abdominal:     General: Bowel sounds are normal.     Palpations: Abdomen is soft.  Musculoskeletal:     Cervical back: Normal range of motion.  Lymphadenopathy:     Cervical: No cervical adenopathy.  Skin:    General: Skin is warm and dry.     Capillary Refill: Capillary refill takes less than 2 seconds.  Neurological:     Mental Status: She is alert and oriented to person, place, and time.  Psychiatric:        Behavior: Behavior normal.      Musculoskeletal Exam: C-spine was in good range of motion.  Shoulder joints, elbow joints, wrist joints with good range of motion.  She has bilateral CMC arthritis and subluxation.  PIP and DIP thickening was noted.  Hip joints and knee joints in good range of motion.  She had no tenderness over ankle joints or MTPs.  CDAI Exam: CDAI Score: -- Patient  Global: --; Provider Global: -- Swollen: --; Tender: -- Joint Exam 07/30/2020   No joint exam has been documented for this visit   There is currently no information documented on the homunculus. Go to the Rheumatology activity and complete the homunculus joint exam.  Investigation: No additional findings.  Imaging: No results found.  Recent Labs: Lab Results  Component Value Date   WBC 5.6 01/31/2017   HGB 12.1 01/31/2017   PLT 390 01/31/2017   NA 141 05/22/2019   K 4.4 05/22/2019   CL 105 05/22/2019   CO2 24 05/22/2019   GLUCOSE 92 05/22/2019   BUN 18 05/22/2019   CREATININE 0.94 05/22/2019   BILITOT 0.4 05/22/2019   ALKPHOS 49 05/22/2019   AST 19 05/22/2019   ALT 11 05/22/2019  PROT 6.1 05/22/2019   ALBUMIN 4.1 05/22/2019   CALCIUM 9.6 05/22/2019   GFRAA 74 05/22/2019    Speciality Comments: No specialty comments available.  Procedures:  No procedures performed Allergies: Nsaids   Assessment / Plan:     Visit Diagnoses: Fibromyalgia -she continues to have some generalized pain and discomfort.  She has positive tender points.  She is on Cymbalta 60 mg p.o. daily.  Side effects were reviewed.  Primary osteoarthritis of both hands-she has severe osteoarthritis in her hands with bilateral CMC arthritis and left CMC subluxation.  Joint protection muscle strengthening was discussed.  We also discussed use of arthritis gloves due to hand discomfort.  Primary osteoarthritis of both knees-she continues to have some knee joint stiffness.  Muscle strength exercises were demonstrated and discussed.  Primary osteoarthritis of both feet-use of proper fitting shoes was discussed.  DDD (degenerative disc disease), lumbar-core strength exercises were discussed.  Osteopenia of multiple sites - DEXA has been monitored by her gynecologist.  Patient states she has not had a DEXA scan in many years.  She will be getting DEXA scan by her GYN this year.  Other fatigue-she has been  experiencing fatigue.  She states she does not sleep until 1 AM.  We had detailed discussion regarding sleep hygiene and may be going early to bed.  Other idiopathic scoliosis, lumbar region  History of gastroesophageal reflux (GERD)  Educated about COVID-19 virus infection-she has been fully vaccinated against COVID-19.  Have advised her to get a booster when it is available to her.  Use of mask, social distancing and hand hygiene was discussed.  I also discussed possible use of monoclonal antibody infusion in case she develops COVID-19 infection.  Orders: No orders of the defined types were placed in this encounter.  No orders of the defined types were placed in this encounter.    Follow-Up Instructions: Return in about 6 months (around 01/27/2021) for FMS, OA.   Bo Merino, MD  Note - This record has been created using Editor, commissioning.  Chart creation errors have been sought, but may not always  have been located. Such creation errors do not reflect on  the standard of medical care.

## 2020-07-24 NOTE — Progress Notes (Signed)
64 y.o. G72P2002 Married White or Caucasian female here for breast and pelvic exam.  I am following her BMDs as well.  Last one was 2014.  She does have hx of L1 compression fracture due to sledding accident.  Pt was going to have this repeated again this year but machine broke down right before her appt.  Has not rescheduled.  Denies vaginal bleeding.  Followed by Dr. Estanislado Pandy for fibromyalgia.  She feels this worsens every year.  She finds this so frustrating.   Son is living with them.  He is 68 and lost job with [employer] due to [employer].  He he looking for another job.  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    Patient's last menstrual period was 11/07/2009.          Sexually active: Yes.    H/O STD:  no  Health Maintenance: PCP:  Leanna Battles.  Last wellness appt was last year  Did blood work at that appt:  unsure Vaccines are up to date:  Has not done pneumovax Colonoscopy: 07-10-2019, tubular adenoma.  Follow up 5 years.   MMG:  05-22-2020 category d density birads 1:neg BMD:  01-17-13 normal Last pap smear:  01-31-17 neg HPV HR neg, 05-21-2019 neg H/o abnormal pap smear:  no   reports that she has never smoked. She has never used smokeless tobacco. She reports current alcohol use. She reports that she does not use drugs.  Past Medical History:  Diagnosis Date  . Acid reflux   . Arthritis    hands and neck  . Back pain 12/01/1998   compression fracture L1 from sledding accident  . Depression   . Fibromyalgia   . Hearing loss in right ear   . Hiatal hernia 11/2017  . MVP (mitral valve prolapse)    Hx  . SVD (spontaneous vaginal delivery)    x 2    Past Surgical History:  Procedure Laterality Date  . BUNIONECTOMY Left 10/16/14   Dr. Durward Fortes  . COLONOSCOPY    . 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 LAPAROSCOPIC BILATERAL SALPINGO OOPHERECTOMY  Bilateral 05/13/2014   Procedure: LAPAROSCOPIC BILATERAL SALPINGO OOPHORECTOMY;  Surgeon: Lyman Speller, MD;  Location: [facility];  Service: Gynecology;  Laterality: Bilateral;  . toe nail removal Left 03/2018  . TONSILLECTOMY  age 13  . WISDOM TOOTH EXTRACTION  age 25    Current Outpatient Medications  Medication Sig Dispense Refill  . CALCIUM PO Take by mouth. With magnesium and zinc    . Cholecalciferol (VITAMIN D) 125 MCG (5000 UT) CAPS Take by mouth daily.    . DULoxetine (CYMBALTA) 60 MG capsule TAKE ONE CAPSULE BY MOUTH DAILY 90 capsule 0  . etodolac (LODINE XL) 400 MG 24 hr tablet Take 400 mg by mouth daily.     Marland Kitchen MAGNESIUM MALATE PO Take 250 mg by mouth daily.    Marland Kitchen omeprazole (PRILOSEC) 40 MG capsule Take 40 mg by mouth daily.    . TURMERIC PO Take by mouth.     No current facility-administered medications for this visit.    Family History  Problem Relation Age of Onset  . Diabetes Mother   . Hearing loss Mother   . Cancer Father        GI  . Heart disease Sister   . Hearing loss Brother   .  Breast cancer Maternal Aunt   . Ovarian cancer Other        MGM, mid 82's    Review of Systems  Constitutional: Positive for fatigue.  All other systems reviewed and are negative.   Exam:   BP 110/64   Pulse 68   Resp 16   Ht 5' 6.25" (1.683 m)   Wt 142 lb (64.4 kg)   LMP 11/07/2009   BMI 22.75 kg/m   Height: 5' 6.25" (168.3 cm)  General appearance: alert, cooperative and appears stated age Breasts: normal appearance, no masses or tenderness Abdomen: soft, non-tender; bowel sounds normal; no masses,  no organomegaly Lymph nodes: Cervical, supraclavicular, and axillary nodes normal.  No abnormal inguinal nodes palpated Neurologic: Grossly normal  Pelvic: External genitalia:  no lesions              Urethra:  normal appearing urethra with no masses, tenderness or lesions              Bartholins and Skenes: normal                 Vagina: normal appearing vagina with  normal color and discharge, no lesions              Cervix: no lesions              Pap taken: No. Bimanual Exam:  Uterus:  normal size, contour, position, consistency, mobility, non-tender              Adnexa: no mass, fullness, tenderness               Rectovaginal: Confirms               Anus:  normal sphincter tone, no lesions  Chaperone, Olene Floss, CMA, was present for exam.  A:  Breast and Pelvic exam PMP, no HRT Fibromyalgia followed by Dr. Estanislado Pandy H/o chronic pain back pain followed by [facility] H/o laparoscopic BSO 7/15  P:   Mammogram guidelines reviewed.  Doing yearly. pap smear neg 2019.  Not indicated today.  Shared decision making between now and 22 discussed BMD has been ordered.  Pt will schedule this. Lab work is planned with Dr. Sharlett Iles Return annually or prn  25 minutes of total time was spent for this patient encounter, including preparation, face-to-face counseling with the patient and coordination of care, and documentation of the encounter.

## 2020-07-28 ENCOUNTER — Other Ambulatory Visit: Payer: Self-pay

## 2020-07-28 ENCOUNTER — Ambulatory Visit (INDEPENDENT_AMBULATORY_CARE_PROVIDER_SITE_OTHER): Payer: Medicare Other | Admitting: Obstetrics & Gynecology

## 2020-07-28 ENCOUNTER — Encounter: Payer: Self-pay | Admitting: Obstetrics & Gynecology

## 2020-07-28 VITALS — BP 110/64 | HR 68 | Resp 16 | Ht 66.25 in | Wt 142.0 lb

## 2020-07-28 DIAGNOSIS — M8580 Other specified disorders of bone density and structure, unspecified site: Secondary | ICD-10-CM

## 2020-07-28 DIAGNOSIS — Z01419 Encounter for gynecological examination (general) (routine) without abnormal findings: Secondary | ICD-10-CM

## 2020-07-30 ENCOUNTER — Other Ambulatory Visit: Payer: Self-pay | Admitting: Obstetrics & Gynecology

## 2020-07-30 ENCOUNTER — Other Ambulatory Visit: Payer: Self-pay

## 2020-07-30 ENCOUNTER — Telehealth: Payer: Self-pay | Admitting: Obstetrics & Gynecology

## 2020-07-30 ENCOUNTER — Ambulatory Visit (INDEPENDENT_AMBULATORY_CARE_PROVIDER_SITE_OTHER): Payer: Medicare Other | Admitting: Rheumatology

## 2020-07-30 ENCOUNTER — Encounter: Payer: Self-pay | Admitting: Rheumatology

## 2020-07-30 VITALS — BP 123/87 | HR 88 | Resp 14 | Ht 66.75 in | Wt 144.8 lb

## 2020-07-30 DIAGNOSIS — M8589 Other specified disorders of bone density and structure, multiple sites: Secondary | ICD-10-CM

## 2020-07-30 DIAGNOSIS — Z7189 Other specified counseling: Secondary | ICD-10-CM

## 2020-07-30 DIAGNOSIS — R5383 Other fatigue: Secondary | ICD-10-CM | POA: Diagnosis not present

## 2020-07-30 DIAGNOSIS — Z8719 Personal history of other diseases of the digestive system: Secondary | ICD-10-CM

## 2020-07-30 DIAGNOSIS — M4126 Other idiopathic scoliosis, lumbar region: Secondary | ICD-10-CM

## 2020-07-30 DIAGNOSIS — M19042 Primary osteoarthritis, left hand: Secondary | ICD-10-CM | POA: Diagnosis not present

## 2020-07-30 DIAGNOSIS — M5136 Other intervertebral disc degeneration, lumbar region: Secondary | ICD-10-CM | POA: Diagnosis not present

## 2020-07-30 DIAGNOSIS — M170 Bilateral primary osteoarthritis of knee: Secondary | ICD-10-CM

## 2020-07-30 DIAGNOSIS — M19041 Primary osteoarthritis, right hand: Secondary | ICD-10-CM | POA: Diagnosis not present

## 2020-07-30 DIAGNOSIS — M51369 Other intervertebral disc degeneration, lumbar region without mention of lumbar back pain or lower extremity pain: Secondary | ICD-10-CM

## 2020-07-30 DIAGNOSIS — M19071 Primary osteoarthritis, right ankle and foot: Secondary | ICD-10-CM | POA: Diagnosis not present

## 2020-07-30 DIAGNOSIS — E2839 Other primary ovarian failure: Secondary | ICD-10-CM

## 2020-07-30 DIAGNOSIS — M797 Fibromyalgia: Secondary | ICD-10-CM

## 2020-07-30 DIAGNOSIS — M19072 Primary osteoarthritis, left ankle and foot: Secondary | ICD-10-CM

## 2020-07-30 NOTE — Telephone Encounter (Signed)
[facility]

## 2020-07-30 NOTE — Telephone Encounter (Signed)
Per review of Epic, BMD order placed and signed on 07/30/20.   Patient notified.   Encounter closed.

## 2020-08-20 ENCOUNTER — Other Ambulatory Visit: Payer: Self-pay

## 2020-08-20 ENCOUNTER — Ambulatory Visit
Admission: RE | Admit: 2020-08-20 | Discharge: 2020-08-20 | Disposition: A | Discharge status: Home / Self Care | Payer: Medicare Other | Source: Ambulatory Visit | Attending: Obstetrics & Gynecology | Admitting: Obstetrics & Gynecology

## 2020-08-20 DIAGNOSIS — M85852 Other specified disorders of bone density and structure, left thigh: Secondary | ICD-10-CM | POA: Diagnosis not present

## 2020-08-20 DIAGNOSIS — E2839 Other primary ovarian failure: Secondary | ICD-10-CM

## 2020-08-20 DIAGNOSIS — Z780 Asymptomatic menopausal state: Secondary | ICD-10-CM | POA: Diagnosis not present

## 2020-08-25 ENCOUNTER — Other Ambulatory Visit: Payer: Self-pay | Admitting: Rheumatology

## 2020-08-25 DIAGNOSIS — M797 Fibromyalgia: Secondary | ICD-10-CM

## 2020-08-25 MED ORDER — DULOXETINE HCL 60 MG PO CPEP: ORAL_CAPSULE | ORAL | 0 refills | Status: DC

## 2020-08-25 NOTE — Telephone Encounter (Signed)
Patient needs refill on Duloxetine 60 mg quantity of 90, sent to [facility] at [place].

## 2020-08-25 NOTE — Telephone Encounter (Signed)
Last Visit: 07/30/2020 Next Visit: 01/28/2021  Current Dose per office note on on 07/30/2020: She is on Cymbalta 60 mg p.o. daily [id number]  Okay to refill cymbalta?

## 2020-09-01 DIAGNOSIS — H2513 Age-related nuclear cataract, bilateral: Secondary | ICD-10-CM | POA: Diagnosis not present

## 2020-09-01 DIAGNOSIS — H524 Presbyopia: Secondary | ICD-10-CM | POA: Diagnosis not present

## 2020-09-01 DIAGNOSIS — H04123 Dry eye syndrome of bilateral lacrimal glands: Secondary | ICD-10-CM | POA: Diagnosis not present

## 2020-09-01 DIAGNOSIS — H18513 Endothelial corneal dystrophy, bilateral: Secondary | ICD-10-CM | POA: Diagnosis not present

## 2020-09-18 DIAGNOSIS — R5383 Other fatigue: Secondary | ICD-10-CM | POA: Diagnosis not present

## 2020-09-18 DIAGNOSIS — R7989 Other specified abnormal findings of blood chemistry: Secondary | ICD-10-CM | POA: Diagnosis not present

## 2020-09-24 DIAGNOSIS — Z0000 Encounter for general adult medical examination without abnormal findings: Secondary | ICD-10-CM | POA: Diagnosis not present

## 2020-09-24 DIAGNOSIS — G8929 Other chronic pain: Secondary | ICD-10-CM | POA: Diagnosis not present

## 2020-09-24 DIAGNOSIS — M25562 Pain in left knee: Secondary | ICD-10-CM | POA: Diagnosis not present

## 2020-09-24 DIAGNOSIS — D649 Anemia, unspecified: Secondary | ICD-10-CM | POA: Diagnosis not present

## 2020-09-24 DIAGNOSIS — M5136 Other intervertebral disc degeneration, lumbar region: Secondary | ICD-10-CM | POA: Diagnosis not present

## 2020-09-24 DIAGNOSIS — M542 Cervicalgia: Secondary | ICD-10-CM | POA: Diagnosis not present

## 2020-09-24 DIAGNOSIS — Z23 Encounter for immunization: Secondary | ICD-10-CM | POA: Diagnosis not present

## 2020-09-24 DIAGNOSIS — M25561 Pain in right knee: Secondary | ICD-10-CM | POA: Diagnosis not present

## 2020-09-24 DIAGNOSIS — N1831 Chronic kidney disease, stage 3a: Secondary | ICD-10-CM | POA: Diagnosis not present

## 2020-09-24 DIAGNOSIS — R12 Heartburn: Secondary | ICD-10-CM | POA: Diagnosis not present

## 2020-09-24 DIAGNOSIS — R82998 Other abnormal findings in urine: Secondary | ICD-10-CM | POA: Diagnosis not present

## 2020-09-24 DIAGNOSIS — M797 Fibromyalgia: Secondary | ICD-10-CM | POA: Diagnosis not present

## 2020-09-24 DIAGNOSIS — R5383 Other fatigue: Secondary | ICD-10-CM | POA: Diagnosis not present

## 2020-11-07 HISTORY — PX: EYE SURGERY: SHX253

## 2020-11-25 ENCOUNTER — Telehealth: Payer: Self-pay

## 2020-11-25 DIAGNOSIS — M797 Fibromyalgia: Secondary | ICD-10-CM

## 2020-11-25 MED ORDER — DULOXETINE HCL 60 MG PO CPEP: ORAL_CAPSULE | ORAL | 0 refills | Status: DC

## 2020-11-25 NOTE — Telephone Encounter (Signed)
Last Visit: 07/30/2020 Next Visit: 01/28/2021  Current Dose per office note on on 07/30/2020: She is onCymbalta 60 mg p.o. daily [id number]  Okay to refill per Dr. Estanislado Pandy

## 2020-11-25 NOTE — Telephone Encounter (Signed)
Patient left a voicemail requesting prescription refill of Duloxetine 60 mg capsule.

## 2020-11-27 DIAGNOSIS — Z1152 Encounter for screening for COVID-19: Secondary | ICD-10-CM | POA: Diagnosis not present

## 2021-01-15 NOTE — Progress Notes (Signed)
Office Visit Note  Patient: Courtney Waters             Date of Birth: [birth year]           MRN: [id number]             PCP: Leanna Battles, MD Referring: Leanna Battles, MD Visit Date: 01/28/2021 Occupation: @GUAROCC @  Subjective:  Fatigue   History of Present Illness: Courtney Waters is a 65 y.o. female with history of fibromyalgia, osteoarthritis, and DDD.   She states she was experiencing increased brain fog, fatigue, and myalgias in January and February, but she has noticed an improvement in her symptoms with warmer weather temperatures.  She has continued to go to water aerobics 3 days a week and has had massages on a monthly basis.  Overall she has been sleeping well and has been sleeping about 8 hours per night.  She states that her myalgias and muscle tenderness have been tolerable over the past several weeks.  She continues to take Cymbalta 60 mg by mouth daily.  Patient reports that she has ongoing pain and stiffness in both hands and both knee joints but denies any joint swelling.  She is also experiencing discomfort in her lower back but denies symptoms of radiculopathy. She denies any recent falls or fractures.  She has been taking calcium and vitamin D supplement on a daily basis.   Activities of Daily Living:  Patient reports morning stiffness for several hours.   Patient Denies nocturnal pain.  Difficulty dressing/grooming: Denies Difficulty climbing stairs: Denies Difficulty getting out of chair: Denies Difficulty using hands for taps, buttons, cutlery, and/or writing: Denies  Review of Systems  Constitutional: Positive for fatigue.  HENT: Negative for mouth sores, mouth dryness and nose dryness.   Eyes: Positive for visual disturbance. Negative for pain and dryness.  Respiratory: Positive for shortness of breath and difficulty breathing. Negative for cough and hemoptysis.   Cardiovascular: Positive for chest pain. Negative for palpitations, hypertension  and swelling in legs/feet.  Gastrointestinal: Negative for blood in stool, constipation and diarrhea.  Endocrine: Negative for increased urination.  Genitourinary: Negative for painful urination.  Musculoskeletal: Positive for arthralgias, joint pain, myalgias, muscle weakness, morning stiffness, muscle tenderness and myalgias. Negative for joint swelling.  Skin: Negative for color change, pallor, rash, hair loss, nodules/bumps, skin tightness, ulcers and sensitivity to sunlight.  Allergic/Immunologic: Negative for susceptible to infections.  Neurological: Negative for dizziness, numbness, headaches and weakness.  Hematological: Negative for swollen glands.  Psychiatric/Behavioral: Positive for sleep disturbance. Negative for depressed mood. The patient is not nervous/anxious.     PMFS History:  Patient Active Problem List   Diagnosis Date Noted  . Primary osteoarthritis of both knees 07/25/2017  . Screening for osteoporosis 07/25/2017  . DDD (degenerative disc disease), lumbar 07/25/2017  . Primary osteoarthritis of both hands 01/26/2017  . Primary osteoarthritis of both feet 01/26/2017  . Other idiopathic scoliosis, lumbar region 01/26/2017  . Fibromyalgia 02/05/2015  . METATARSALGIA 08/14/2007  . BUNIONS, BILATERAL 08/14/2007  . UNEQUAL LEG LENGTH, ACQUIRED 08/14/2007  . HEEL PAIN, LEFT 08/08/2007  . Osteopenia 08/08/2007    Past Medical History:  Diagnosis Date  . Acid reflux   . Arthritis    hands and neck  . Back pain 12/01/1998   compression fracture L1 from sledding accident  . Depression   . Fibromyalgia   . Hearing loss in right ear   . Hiatal hernia 11/2017  . MVP (mitral valve prolapse)  Hx  . SVD (spontaneous vaginal delivery)    x 2    Family History  Problem Relation Age of Onset  . Diabetes Mother   . Hearing loss Mother   . Cancer Father        GI  . Heart disease Sister   . Hearing loss Brother   . Breast cancer Maternal Aunt   . Ovarian cancer  Other        MGM, mid 56's   Past Surgical History:  Procedure Laterality Date  . BUNIONECTOMY Left 10/16/14   Dr. Durward Fortes  . COLONOSCOPY    . 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 LAPAROSCOPIC BILATERAL SALPINGO OOPHERECTOMY Bilateral 05/13/2014   Procedure: LAPAROSCOPIC BILATERAL SALPINGO OOPHORECTOMY;  Surgeon: Lyman Speller, MD;  Location: [facility];  Service: Gynecology;  Laterality: Bilateral;  . toe nail removal Left 03/2018  . TONSILLECTOMY  age 17  . WISDOM TOOTH EXTRACTION  age 73   Social History   Social History Narrative  . Not on file   Immunization History  Administered Date(s) Administered  . Influenza,inj,Quad PF,6+ Mos 07/23/2019  . PFIZER(Purple Top)SARS-COV-2 Vaccination 12/19/2019, 01/13/2020, 09/20/2020  . Tdap 01/31/2017     Objective: Vital Signs: BP 109/77 (BP Location: Left Arm, Patient Position: Sitting, Cuff Size: Normal)   Pulse 86   Ht 5\' 7"  (1.702 m)   Wt 145 lb 12.8 oz (66.1 kg)   LMP 11/07/2009   BMI 22.84 kg/m    Physical Exam Vitals and nursing note reviewed.  Constitutional:      Appearance: She is well-developed.  HENT:     Head: Normocephalic and atraumatic.  Eyes:     Conjunctiva/sclera: Conjunctivae normal.  Pulmonary:     Effort: Pulmonary effort is normal.  Abdominal:     Palpations: Abdomen is soft.  Musculoskeletal:     Cervical back: Normal range of motion.  Skin:    General: Skin is warm and dry.     Capillary Refill: Capillary refill takes less than 2 seconds.  Neurological:     Mental Status: She is alert and oriented to person, place, and time.  Psychiatric:        Behavior: Behavior normal.      Musculoskeletal Exam: C-spine, thoracic spine, and lumbar spine good ROM.  Shoulder joints, elbow joints, wrist joints, MCPs, PIPs, and DIPs good ROM with no synovitis.  CMC joint thickening and subluxation noted bilaterally,  left greater than right.  PIP and DIP thickening consistent with osteoarthritis of both hands noted.  Complete fist formation bilaterally.  Hip joints, knee joints, and ankle joints good ROM with no discomfort.  Mild tenderness palpation over bilateral trochanteric bursa noted.  No warmth or effusion of knee joints.  No tenderness or swelling of ankle joints.  CDAI Exam: CDAI Score: -- Patient Global: --; Provider Global: -- Swollen: --; Tender: -- Joint Exam 01/28/2021   No joint exam has been documented for this visit   There is currently no information documented on the homunculus. Go to the Rheumatology activity and complete the homunculus joint exam.  Investigation: No additional findings.  Imaging: No results found.  Recent Labs: Lab Results  Component Value Date   WBC 5.6 01/31/2017   HGB 12.1 01/31/2017   PLT 390 01/31/2017   NA 141 05/22/2019   K 4.4 05/22/2019   CL 105 05/22/2019   CO2 24 05/22/2019  GLUCOSE 92 05/22/2019   BUN 18 05/22/2019   CREATININE 0.94 05/22/2019   BILITOT 0.4 05/22/2019   ALKPHOS 49 05/22/2019   AST 19 05/22/2019   ALT 11 05/22/2019   PROT 6.1 05/22/2019   ALBUMIN 4.1 05/22/2019   CALCIUM 9.6 05/22/2019   GFRAA 74 05/22/2019    Speciality Comments: No specialty comments available.  Procedures:  No procedures performed Allergies: Nsaids    Assessment / Plan:     Visit Diagnoses: Fibromyalgia -She experiences intermittent myalgias and muscle tenderness due to fibromyalgia.  Overall her level of pain has been tolerable.  She continues to take Cymbalta 60 mg p.o. daily.  She was experiencing increased painful, fatigue, and intermittent myalgias in January and February 2022.  Her symptoms have gradually started to improve with further low-grade temperatures.  She continues to go to water aerobics 3 days a week as well as has massages on a monthly basis.  We discussed the importance of regular exercise and good sleep hygiene.  She was  offered a referral to physical therapy that includes water therapy but she declined at this time.  She will follow up in the office in 6 months.   Primary osteoarthritis of both hands: She has PIP and DIP thickening consistent with osteoarthritis of both hands.  She has CMC thickening and subluxation bilaterally.  We discussed the importance of joint protection and muscle strengthening.  She was encouraged to perform hand exercises on a daily basis.  She was also encouraged to continue to take turmeric as previously recommended.  Primary osteoarthritis of both knees: She has good range of motion of both knee joints on examination today.  No warmth or effusion was noted.  She has been experiencing increased pain and stiffness in both knees.  We discussed the importance of lower extremity muscle strengthening.  She was encouraged to continue to go to water aerobics 3 days a week.  Primary osteoarthritis of both feet: She is not experiencing any increased discomfort in her feet at this time.  She is wearing proper fitting shoes.  DDD (degenerative disc disease), lumbar: She has been experiencing increased discomfort in her lower back.  She has not had any symptoms of radiculopathy.  She has been getting a massage on a monthly basis as well as going to water aerobics 3 days a week.  Other idiopathic scoliosis, lumbar region: She has been experiencing increased discomfort in her lower back.  She has been getting a massage on a monthly basis which provides temporary relief.  She has been going to water aerobics 3 days a week which has improved some of her overall myalgias and fatigue.  She declined a referral to physical therapy at this time but was advised to notify us if she would like a referral in the future.  Osteopenia of multiple sites - DEXA updated on 08/20/20:The BMD measured at Femur Neck Left is 0.855 g/cm2 with a T-score of -1.3.  She continues to take a calcium and vitamin D supplement daily. She  has not had any recent falls or fractures. Due to update DEXA in October 2023.    Other fatigue: She was experiencing increased fatigue in January and February 2022.  Her brain fog, fatigue, and myalgias have gradually started to improve with warmer weather temperatures.  She has continued to go to water aerobics 3 days a week.  We discussed the importance of regular exercise and good sleep hygiene.  History of gastroesophageal reflux (GERD): She has been experiencing  worsening symptoms of reflux.  She has been taking omeprazole as recommended.  She was advised to schedule appointment with Dr. Collene Mares for further evaluation.   Orders: No orders of the defined types were placed in this encounter.  No orders of the defined types were placed in this encounter.     Follow-Up Instructions: Return in about 6 months (around 07/31/2021) for Fibromyalgia, Osteoarthritis, DDD.   Ofilia Neas, PA-C  Note - This record has been created using Dragon software.  Chart creation errors have been sought, but may not always  have been located. Such creation errors do not reflect on  the standard of medical care.

## 2021-01-28 ENCOUNTER — Ambulatory Visit (INDEPENDENT_AMBULATORY_CARE_PROVIDER_SITE_OTHER): Payer: Medicare Other | Admitting: Physician Assistant

## 2021-01-28 ENCOUNTER — Other Ambulatory Visit: Payer: Self-pay

## 2021-01-28 ENCOUNTER — Encounter: Payer: Self-pay | Admitting: Physician Assistant

## 2021-01-28 VITALS — BP 109/77 | HR 86 | Ht 67.0 in | Wt 145.8 lb

## 2021-01-28 DIAGNOSIS — M797 Fibromyalgia: Secondary | ICD-10-CM

## 2021-01-28 DIAGNOSIS — M8589 Other specified disorders of bone density and structure, multiple sites: Secondary | ICD-10-CM | POA: Diagnosis not present

## 2021-01-28 DIAGNOSIS — M4126 Other idiopathic scoliosis, lumbar region: Secondary | ICD-10-CM | POA: Diagnosis not present

## 2021-01-28 DIAGNOSIS — M19071 Primary osteoarthritis, right ankle and foot: Secondary | ICD-10-CM | POA: Diagnosis not present

## 2021-01-28 DIAGNOSIS — R5383 Other fatigue: Secondary | ICD-10-CM

## 2021-01-28 DIAGNOSIS — M19072 Primary osteoarthritis, left ankle and foot: Secondary | ICD-10-CM

## 2021-01-28 DIAGNOSIS — M5136 Other intervertebral disc degeneration, lumbar region: Secondary | ICD-10-CM | POA: Diagnosis not present

## 2021-01-28 DIAGNOSIS — M170 Bilateral primary osteoarthritis of knee: Secondary | ICD-10-CM | POA: Diagnosis not present

## 2021-01-28 DIAGNOSIS — M19042 Primary osteoarthritis, left hand: Secondary | ICD-10-CM

## 2021-01-28 DIAGNOSIS — Z8719 Personal history of other diseases of the digestive system: Secondary | ICD-10-CM

## 2021-01-28 DIAGNOSIS — M19041 Primary osteoarthritis, right hand: Secondary | ICD-10-CM

## 2021-01-28 NOTE — Patient Instructions (Signed)
Hand Exercises Hand exercises can be helpful for almost anyone. These exercises can strengthen the hands, improve flexibility and movement, and increase blood flow to the hands. These results can make work and daily tasks easier. Hand exercises can be especially helpful for people who have joint pain from arthritis or have nerve damage from overuse (carpal tunnel syndrome). These exercises can also help people who have injured a hand. Exercises Most of these hand exercises are gentle stretching and motion exercises. It is usually safe to do them often throughout the day. Warming up your hands before exercise may help to reduce stiffness. You can do this with gentle massage or by placing your hands in warm water for 10-15 minutes. It is normal to feel some stretching, pulling, tightness, or mild discomfort as you begin new exercises. This will gradually improve. Stop an exercise right away if you feel sudden, severe pain or your pain gets worse. Ask your health care provider which exercises are best for you. Knuckle bend or "claw" fist 1. Stand or sit with your arm, hand, and all five fingers pointed straight up. Make sure to keep your wrist straight during the exercise. 2. Gently bend your fingers down toward your palm until the tips of your fingers are touching the top of your palm. Keep your big knuckle straight and just bend the small knuckles in your fingers. 3. Hold this position for __________ seconds. 4. Straighten (extend) your fingers back to the starting position. Repeat this exercise 5-10 times with each hand. Full finger fist 1. Stand or sit with your arm, hand, and all five fingers pointed straight up. Make sure to keep your wrist straight during the exercise. 2. Gently bend your fingers into your palm until the tips of your fingers are touching the middle of your palm. 3. Hold this position for __________ seconds. 4. Extend your fingers back to the starting position, stretching every  joint fully. Repeat this exercise 5-10 times with each hand. Straight fist 1. Stand or sit with your arm, hand, and all five fingers pointed straight up. Make sure to keep your wrist straight during the exercise. 2. Gently bend your fingers at the big knuckle, where your fingers meet your hand, and the middle knuckle. Keep the knuckle at the tips of your fingers straight and try to touch the bottom of your palm. 3. Hold this position for __________ seconds. 4. Extend your fingers back to the starting position, stretching every joint fully. Repeat this exercise 5-10 times with each hand. Tabletop 1. Stand or sit with your arm, hand, and all five fingers pointed straight up. Make sure to keep your wrist straight during the exercise. 2. Gently bend your fingers at the big knuckle, where your fingers meet your hand, as far down as you can while keeping the small knuckles in your fingers straight. Think of forming a tabletop with your fingers. 3. Hold this position for __________ seconds. 4. Extend your fingers back to the starting position, stretching every joint fully. Repeat this exercise 5-10 times with each hand. Finger spread 1. Place your hand flat on a table with your palm facing down. Make sure your wrist stays straight as you do this exercise. 2. Spread your fingers and thumb apart from each other as far as you can until you feel a gentle stretch. Hold this position for __________ seconds. 3. Bring your fingers and thumb tight together again. Hold this position for __________ seconds. Repeat this exercise 5-10 times with each hand.   Making circles 1. Stand or sit with your arm, hand, and all five fingers pointed straight up. Make sure to keep your wrist straight during the exercise. 2. Make a circle by touching the tip of your thumb to the tip of your index finger. 3. Hold for __________ seconds. Then open your hand wide. 4. Repeat this motion with your thumb and each finger on your  hand. Repeat this exercise 5-10 times with each hand. Thumb motion 1. Sit with your forearm resting on a table and your wrist straight. Your thumb should be facing up toward the ceiling. Keep your fingers relaxed as you move your thumb. 2. Lift your thumb up as high as you can toward the ceiling. Hold for __________ seconds. 3. Bend your thumb across your palm as far as you can, reaching the tip of your thumb for the small finger (pinkie) side of your palm. Hold for __________ seconds. Repeat this exercise 5-10 times with each hand. Grip strengthening 1. Hold a stress ball or other soft ball in the middle of your hand. 2. Slowly increase the pressure, squeezing the ball as much as you can without causing pain. Think of bringing the tips of your fingers into the middle of your palm. All of your finger joints should bend when doing this exercise. 3. Hold your squeeze for __________ seconds, then relax. Repeat this exercise 5-10 times with each hand.   Contact a health care provider if:  Your hand pain or discomfort gets much worse when you do an exercise.  Your hand pain or discomfort does not improve within 2 hours after you exercise. If you have any of these problems, stop doing these exercises right away. Do not do them again unless your health care provider says that you can. Get help right away if:  You develop sudden, severe hand pain or swelling. If this happens, stop doing these exercises right away. Do not do them again unless your health care provider says that you can. This information is not intended to replace advice given to you by your health care provider. Make sure you discuss any questions you have with your health care provider. Document Revised: 02/14/2019 Document Reviewed: 10/25/2018 Elsevier Patient Education  2021 [employer]  

## 2021-02-23 ENCOUNTER — Other Ambulatory Visit: Payer: Self-pay

## 2021-02-23 DIAGNOSIS — M797 Fibromyalgia: Secondary | ICD-10-CM

## 2021-02-23 MED ORDER — DULOXETINE HCL 60 MG PO CPEP: ORAL_CAPSULE | ORAL | 0 refills | Status: DC

## 2021-02-23 NOTE — Telephone Encounter (Signed)
Patient requested prescription refill of Duloxetine (90 day supply) to be sent to [facility] at [facility].

## 2021-02-23 NOTE — Telephone Encounter (Signed)
Next Visit: 08/03/2021  Last Visit: 01/28/2021  Last Fill: 11/25/2020  Dx: Fibromyalgia   Current Dose per office note on 01/28/2021, Cymbalta 60 mg p.o. daily  Okay to refill Cymbalta?

## 2021-04-26 ENCOUNTER — Other Ambulatory Visit: Payer: Self-pay | Admitting: Internal Medicine

## 2021-04-26 DIAGNOSIS — Z1231 Encounter for screening mammogram for malignant neoplasm of breast: Secondary | ICD-10-CM

## 2021-05-18 ENCOUNTER — Telehealth: Payer: Self-pay

## 2021-05-18 DIAGNOSIS — M797 Fibromyalgia: Secondary | ICD-10-CM

## 2021-05-18 MED ORDER — DULOXETINE HCL 60 MG PO CPEP: ORAL_CAPSULE | ORAL | 0 refills | Status: DC

## 2021-05-18 NOTE — Telephone Encounter (Signed)
Next Visit: 08/03/2021   Last Visit: 01/28/2021   Last Fill: 02/23/2021   Dx: Fibromyalgia    Current Dose per office note on 01/28/2021, Cymbalta 60 mg p.o. daily   Per protocol okay to refill per Dr. Estanislado Pandy

## 2021-05-18 NOTE — Telephone Encounter (Signed)
Patient left a voicemail requesting prescription refill of Duloxetine 60 mg capsule to be sent to [facility] at [facility] [place].

## 2021-06-17 ENCOUNTER — Ambulatory Visit
Admission: RE | Admit: 2021-06-17 | Discharge: 2021-06-17 | Disposition: A | Discharge status: Home / Self Care | Payer: Medicare Other | Source: Ambulatory Visit

## 2021-06-17 ENCOUNTER — Other Ambulatory Visit: Payer: Self-pay | Admitting: Obstetrics & Gynecology

## 2021-06-17 ENCOUNTER — Other Ambulatory Visit: Payer: Self-pay

## 2021-06-17 DIAGNOSIS — Z1231 Encounter for screening mammogram for malignant neoplasm of breast: Secondary | ICD-10-CM

## 2021-07-21 NOTE — Progress Notes (Addendum)
Office Visit Note  Patient: Courtney Waters             Date of Birth: 10/14/1956           MRN: [id number]             PCP: Leanna Battles, MD Referring: Leanna Battles, MD Visit Date: 08/03/2021 Occupation: '@GUAROCC'$ @  Subjective:  Pain in hands and knees   History of Present Illness: Courtney Waters is a 65 y.o. female with a history of osteoarthritis and fibromyalgia syndrome.  She states she continues to have discomfort in her hands, knees, feet and her lower back.  She also has generalized pain from fibromyalgia.  She states is difficult for her to differentiate between the pain from osteoarthritis and fibromyalgia.  She has been taking Cymbalta for the pain management which has been helpful.  She states recently she has been increased problems with her reflux.  She states she throws up at night to get some relief.  She has been eating dinner had late night and also eats chocolate.  She states she has not seen Dr. Collene Mares in the last few years.  Activities of Daily Living:  Patient reports morning stiffness for all day. Patient Reports nocturnal pain.  Difficulty dressing/grooming: Denies Difficulty climbing stairs: Reports Difficulty getting out of chair: Reports Difficulty using hands for taps, buttons, cutlery, and/or writing: Denies  Review of Systems  Constitutional:  Positive for fatigue.  HENT:  Negative for mouth sores, mouth dryness and nose dryness.   Eyes:  Positive for dryness. Negative for pain and itching.  Respiratory:  Positive for shortness of breath. Negative for difficulty breathing.   Cardiovascular:  Negative for chest pain and palpitations.  Gastrointestinal:  Positive for heartburn. Negative for blood in stool, constipation and diarrhea.  Endocrine: Negative for increased urination.  Genitourinary:  Negative for difficulty urinating.  Musculoskeletal:  Positive for joint pain, joint pain, myalgias, morning stiffness, muscle tenderness and  myalgias. Negative for joint swelling.  Skin:  Negative for color change, rash and redness.  Allergic/Immunologic: Negative for susceptible to infections.  Neurological:  Positive for weakness. Negative for dizziness, numbness, headaches and memory loss.  Hematological:  Negative for bruising/bleeding tendency.  Psychiatric/Behavioral:  Positive for sleep disturbance. Negative for confusion.    PMFS History:  Patient Active Problem List   Diagnosis Date Noted   Primary osteoarthritis of both knees 07/25/2017   Screening for osteoporosis 07/25/2017   DDD (degenerative disc disease), lumbar 07/25/2017   Primary osteoarthritis of both hands 01/26/2017   Primary osteoarthritis of both feet 01/26/2017   Other idiopathic scoliosis, lumbar region 01/26/2017   Fibromyalgia 02/05/2015   METATARSALGIA 08/14/2007   BUNIONS, BILATERAL 08/14/2007   UNEQUAL LEG LENGTH, ACQUIRED 08/14/2007   HEEL PAIN, LEFT 08/08/2007   Osteopenia 08/08/2007    Past Medical History:  Diagnosis Date   Acid reflux    Arthritis    hands and neck   Back pain 12/01/1998   compression fracture L1 from sledding accident   Depression    Fibromyalgia    Hearing loss in right ear    Hiatal hernia 11/2017   MVP (mitral valve prolapse)    Hx   SVD (spontaneous vaginal delivery)    x 2    Family History  Problem Relation Age of Onset   Diabetes Mother    Hearing loss Mother    Cancer Father        GI   Heart disease Sister  Hearing loss Brother    Breast cancer Maternal Aunt    Ovarian cancer Other        MGM, mid 53's   Past Surgical History:  Procedure Laterality Date   BUNIONECTOMY Left 10/16/14   Dr. 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   LAPAROSCOPIC BILATERAL SALPINGO OOPHERECTOMY Bilateral 05/13/2014   Procedure: LAPAROSCOPIC BILATERAL SALPINGO OOPHORECTOMY;  Surgeon: Lyman Speller, MD;  Location: [facility];  Service: Gynecology;  Laterality: Bilateral;   toe nail removal Left 03/2018   TONSILLECTOMY  age 87   WISDOM TOOTH EXTRACTION  age 65   Social History   Social History Narrative   Not on file   Immunization History  Administered Date(s) Administered   Influenza,inj,Quad PF,6+ Mos 07/23/2019   PFIZER(Purple Top)SARS-COV-2 Vaccination 12/19/2019, 01/13/2020, 09/20/2020   Tdap 01/31/2017     Objective: Vital Signs: BP 137/89 (BP Location: Left Arm, Patient Position: Sitting, Cuff Size: Normal)   Pulse 80   Ht '5\' 7"'$  (1.702 m)   Wt 145 lb 3.2 oz (65.9 kg)   LMP 11/07/2009   BMI 22.74 kg/m    Physical Exam Vitals and nursing note reviewed.  Constitutional:      Appearance: She is well-developed.  HENT:     Head: Normocephalic and atraumatic.  Eyes:     Conjunctiva/sclera: Conjunctivae normal.  Cardiovascular:     Rate and Rhythm: Normal rate and regular rhythm.     Heart sounds: Normal heart sounds.  Pulmonary:     Effort: Pulmonary effort is normal.     Breath sounds: Normal breath sounds.  Abdominal:     General: Bowel sounds are normal.     Palpations: Abdomen is soft.  Musculoskeletal:     Cervical back: Normal range of motion.  Lymphadenopathy:     Cervical: No cervical adenopathy.  Skin:    General: Skin is warm and dry.     Capillary Refill: Capillary refill takes less than 2 seconds.  Neurological:     Mental Status: She is alert and oriented to person, place, and time.  Psychiatric:        Behavior: Behavior normal.     Musculoskeletal Exam: C-spine was in good range of motion.  She had bilateral trapezius spasm.  She had discomfort range of motion of her lumbar spine.  Shoulder joints, elbow joints, wrist joints with good range of motion.  She bilateral CMC PIP and DIP thickening.  Hip joints and knee joints in good range of motion without any warmth swelling or effusion.  There was no tenderness over ankles or MTPs.  CDAI  Exam: CDAI Score: -- Patient Global: --; Provider Global: -- Swollen: --; Tender: -- Joint Exam 08/03/2021   No joint exam has been documented for this visit   There is currently no information documented on the homunculus. Go to the Rheumatology activity and complete the homunculus joint exam.  Investigation: No additional findings.  Imaging: No results found.  Recent Labs: Lab Results  Component Value Date   WBC 5.6 01/31/2017   HGB 12.1 01/31/2017   PLT 390 01/31/2017   NA 141 05/22/2019   K 4.4 05/22/2019   CL 105 05/22/2019   CO2 24 05/22/2019   GLUCOSE 92 05/22/2019   BUN 18 05/22/2019   CREATININE 0.94 05/22/2019   BILITOT 0.4 05/22/2019   ALKPHOS 49 05/22/2019  AST 19 05/22/2019   ALT 11 05/22/2019   PROT 6.1 05/22/2019   ALBUMIN 4.1 05/22/2019   CALCIUM 9.6 05/22/2019   GFRAA 74 05/22/2019    Speciality Comments: No specialty comments available.  Procedures:  No procedures performed Allergies: Nsaids   Assessment / Plan:     Visit Diagnoses: Fibromyalgia -she continues to have symptoms generalized discomfort from fibromyalgia.  Need for regular exercise was discussed.  Stretching exercises were emphasized.  She has been doing water aerobics which has been helpful.  She is on Cymbalta 60 mg p.o. daily. - Plan: DULoxetine (CYMBALTA) 60 MG capsule  Primary osteoarthritis of both hands-she had bilateral CMC PIP and DIP thickening.  Joint protection muscle strengthening was discussed.  Primary osteoarthritis of both knees-she is off-and-on discomfort in her knee joints.  No warmth swelling or effusion was noted.  Lower extremity muscle strengthening was discussed.  Primary osteoarthritis of both feet-proper fitting shoes were discussed.  DDD (degenerative disc disease), lumbar-core strengthening exercises were demonstrated and discussed.  Other idiopathic scoliosis, lumbar region  Osteopenia of multiple sites - DEXA updated on 08/20/20:The BMD measured  at Femur Neck Left is 0.855 g/cm2 with a T-score of -1.3.  Use of calcium rich diet and vitamin D supplement with resistive exercise was emphasized.  Other fatigue  History of gastroesophageal reflux (GERD)-she has been experiencing increased reflux symptoms.  She has not seen Dr. Collene Mares in a while.  Advised her to schedule an appointment as soon as possible.  Reflux precautions were discussed.  Dietary management was discussed.  I advised her to stop the supplements including turmeric.  Hiatal hernia-I reviewed previous imaging studies.  The barium swallow study from 2020 showed enlargement of hiatal hernia.  I advised her to schedule an appointment with Dr. Collene Mares to discuss management of hiatal hernia and reflux.  Orders: No orders of the defined types were placed in this encounter.  Meds ordered this encounter  Medications   DULoxetine (CYMBALTA) 60 MG capsule    Sig: TAKE ONE CAPSULE BY MOUTH DAILY    Dispense:  90 capsule    Refill:  0     Follow-Up Instructions: Return in about 6 months (around 01/31/2022) for Osteoarthritis, FMS.   Bo Merino, MD  Note - This record has been created using Editor, commissioning.  Chart creation errors have been sought, but may not always  have been located. Such creation errors do not reflect on  the standard of medical care.

## 2021-07-27 ENCOUNTER — Ambulatory Visit: Payer: Medicare Other | Admitting: Rheumatology

## 2021-08-03 ENCOUNTER — Other Ambulatory Visit: Payer: Self-pay

## 2021-08-03 ENCOUNTER — Encounter: Payer: Self-pay | Admitting: Rheumatology

## 2021-08-03 ENCOUNTER — Other Ambulatory Visit: Payer: Self-pay | Admitting: Rheumatology

## 2021-08-03 ENCOUNTER — Ambulatory Visit (INDEPENDENT_AMBULATORY_CARE_PROVIDER_SITE_OTHER): Payer: Medicare Other | Admitting: Rheumatology

## 2021-08-03 VITALS — BP 137/89 | HR 80 | Ht 67.0 in | Wt 145.2 lb

## 2021-08-03 DIAGNOSIS — M19042 Primary osteoarthritis, left hand: Secondary | ICD-10-CM | POA: Diagnosis not present

## 2021-08-03 DIAGNOSIS — M19041 Primary osteoarthritis, right hand: Secondary | ICD-10-CM

## 2021-08-03 DIAGNOSIS — M797 Fibromyalgia: Secondary | ICD-10-CM | POA: Diagnosis not present

## 2021-08-03 DIAGNOSIS — R5383 Other fatigue: Secondary | ICD-10-CM | POA: Diagnosis not present

## 2021-08-03 DIAGNOSIS — K449 Diaphragmatic hernia without obstruction or gangrene: Secondary | ICD-10-CM | POA: Diagnosis not present

## 2021-08-03 DIAGNOSIS — Z8719 Personal history of other diseases of the digestive system: Secondary | ICD-10-CM | POA: Diagnosis not present

## 2021-08-03 DIAGNOSIS — M19072 Primary osteoarthritis, left ankle and foot: Secondary | ICD-10-CM

## 2021-08-03 DIAGNOSIS — M19071 Primary osteoarthritis, right ankle and foot: Secondary | ICD-10-CM

## 2021-08-03 DIAGNOSIS — M8589 Other specified disorders of bone density and structure, multiple sites: Secondary | ICD-10-CM

## 2021-08-03 DIAGNOSIS — M4126 Other idiopathic scoliosis, lumbar region: Secondary | ICD-10-CM | POA: Diagnosis not present

## 2021-08-03 DIAGNOSIS — M5136 Other intervertebral disc degeneration, lumbar region: Secondary | ICD-10-CM | POA: Diagnosis not present

## 2021-08-03 DIAGNOSIS — M170 Bilateral primary osteoarthritis of knee: Secondary | ICD-10-CM | POA: Diagnosis not present

## 2021-08-03 MED ORDER — DULOXETINE HCL 60 MG PO CPEP: ORAL_CAPSULE | ORAL | 0 refills | Status: DC

## 2021-08-03 NOTE — Patient Instructions (Addendum)
Gastroesophageal Reflux Disease, Adult Gastroesophageal reflux (GER) happens when acid from the stomach flows up into the tube that connects the mouth and the stomach (esophagus). Normally, food travels down the esophagus and stays in the stomach to be digested. With GER, food and stomach acid sometimes move back up into the esophagus. You may have a disease called gastroesophageal reflux disease (GERD) if the reflux: Happens often. Causes frequent or very bad symptoms. Causes problems such as damage to the esophagus. When this happens, the esophagus becomes sore and swollen. Over time, GERD can make small holes (ulcers) in the lining of the esophagus. What are the causes? This condition is caused by a problem with the muscle between the esophagus and the stomach. When this muscle is weak or not normal, it does not close properly to keep food and acid from coming back up from the stomach. The muscle can be weak because of: Tobacco use. Pregnancy. Having a certain type of hernia (hiatal hernia). Alcohol use. Certain foods and drinks, such as coffee, chocolate, onions, and peppermint. What increases the risk? Being overweight. Having a disease that affects your connective tissue. Taking NSAIDs, such a ibuprofen. What are the signs or symptoms? Heartburn. Difficult or painful swallowing. The feeling of having a lump in the throat. A bitter taste in the mouth. Bad breath. Having a lot of saliva. Having an upset or bloated stomach. Burping. Chest pain. Different conditions can cause chest pain. Make sure you see your doctor if you have chest pain. Shortness of breath or wheezing. A long-term cough or a cough at night. Wearing away of the surface of teeth (tooth enamel). Weight loss. How is this treated? Making changes to your diet. Taking medicine. Having surgery. Treatment will depend on how bad your symptoms are. Follow these instructions at home: Eating and drinking  Follow a  diet as told by your doctor. You may need to avoid foods and drinks such as: Coffee and tea, with or without caffeine. Drinks that contain alcohol. Energy drinks and sports drinks. Bubbly (carbonated) drinks or sodas. Chocolate and cocoa. Peppermint and mint flavorings. Garlic and onions. Horseradish. Spicy and acidic foods. These include peppers, chili powder, curry powder, vinegar, hot sauces, and BBQ sauce. Citrus fruit juices and citrus fruits, such as oranges, lemons, and limes. Tomato-based foods. These include red sauce, chili, salsa, and pizza with red sauce. Fried and fatty foods. These include donuts, french fries, potato chips, and high-fat dressings. High-fat meats. These include hot dogs, rib eye steak, sausage, ham, and bacon. High-fat dairy items, such as whole milk, butter, and cream cheese. Eat small meals often. Avoid eating large meals. Avoid drinking large amounts of liquid with your meals. Avoid eating meals during the 2-3 hours before bedtime. Avoid lying down right after you eat. Do not exercise right after you eat. Lifestyle  Do not smoke or use any products that contain nicotine or tobacco. If you need help quitting, ask your doctor. Try to lower your stress. If you need help doing this, ask your doctor. If you are overweight, lose an amount of weight that is healthy for you. Ask your doctor about a safe weight loss goal. General instructions Pay attention to any changes in your symptoms. Take over-the-counter and prescription medicines only as told by your doctor. Do not take aspirin, ibuprofen, or other NSAIDs unless your doctor says it is okay. Wear loose clothes. Do not wear anything tight around your waist. Raise (elevate) the head of your bed about 6  inches (15 cm). You may need to use a wedge to do this. Avoid bending over if this makes your symptoms worse. Keep all follow-up visits. Contact a doctor if: You have new symptoms. You lose weight and you  do not know why. You have trouble swallowing or it hurts to swallow. You have wheezing or a cough that keeps happening. You have a hoarse voice. Your symptoms do not get better with treatment. Get help right away if: You have sudden pain in your arms, neck, jaw, teeth, or back. You suddenly feel sweaty, dizzy, or light-headed. You have chest pain or shortness of breath. You vomit and the vomit is green, yellow, or black, or it looks like blood or coffee grounds. You faint. Your poop (stool) is red, bloody, or black. You cannot swallow, drink, or eat. These symptoms may represent a serious problem that is an emergency. Do not wait to see if the symptoms will go away. Get medical help right away. Call your local emergency services (911 in the [place] Do not drive yourself to the hospital. Summary If a person has gastroesophageal reflux disease (GERD), food and stomach acid move back up into the esophagus and cause symptoms or problems such as damage to the esophagus. Treatment will depend on how bad your symptoms are. Follow a diet as told by your doctor. Take all medicines only as told by your doctor. This information is not intended to replace advice given to you by your health care provider. Make sure you discuss any questions you have with your health care provider. Document Revised: 05/04/2020 Document Reviewed: 05/04/2020 Elsevier Patient Education  2022 [place] for Gastroesophageal Reflux Disease, Adult When you have gastroesophageal reflux disease (GERD), the foods you eat and your eating habits are very important. Choosing the right foods can help ease your discomfort. Think about working with a food expert (dietitian) to help you make good choices. What are tips for following this plan? Reading food labels Look for foods that are low in saturated fat. Foods that may help with your symptoms include: Foods that have less than 5% of daily value (DV) of fat. Foods  that have 0 grams of trans fat. Cooking Do not fry your food. Cook your food by baking, steaming, grilling, or broiling. These are all methods that do not need a lot of fat for cooking. To add flavor, try to use herbs that are low in spice and acidity. Meal planning  Choose healthy foods that are low in fat, such as: Fruits and vegetables. Whole grains. Low-fat dairy products. Lean meats, fish, and poultry. Eat small meals often instead of eating 3 large meals each day. Eat your meals slowly in a place where you are relaxed. Avoid bending over or lying down until 2-3 hours after eating. Limit high-fat foods such as fatty meats or fried foods. Limit your intake of fatty foods, such as oils, butter, and shortening. Avoid the following as told by your doctor: Foods that cause symptoms. These may be different for different people. Keep a food diary to keep track of foods that cause symptoms. Alcohol. Drinking a lot of liquid with meals. Eating meals during the 2-3 hours before bed. Lifestyle Stay at a healthy weight. Ask your doctor what weight is healthy for you. If you need to lose weight, work with your doctor to do so safely. Exercise for at least 30 minutes on 5 or more days each week, or as told by your doctor. Wear loose-fitting  clothes. Do not smoke or use any products that contain nicotine or tobacco. If you need help quitting, ask your doctor. Sleep with the head of your bed higher than your feet. Use a wedge under the mattress or blocks under the bed frame to raise the head of the bed. Chew sugar-free gum after meals. What foods should eat? Eat a healthy, well-balanced diet of fruits, vegetables, whole grains, low-fat dairy products, lean meats, fish, and poultry. Each person is different. Foods that may cause symptoms in one person may not cause any symptoms in another person. Work with your doctor to find foods that are safe for you. The items listed above may not be a complete  list of what you can eat and drink. Contact a food expert for more options. What foods should I avoid? Limiting some of these foods may help in managing the symptoms of GERD. Everyone is different. Talk with a food expert or your doctor to help you find the exact foods to avoid, if any. Fruits Any fruits prepared with added fat. Any fruits that cause symptoms. For some people, this may include citrus fruits, such as oranges, grapefruit, pineapple, and lemons. Vegetables Deep-fried vegetables. [place] fries. Any vegetables prepared with added fat. Any vegetables that cause symptoms. For some people, this may include tomatoes and tomato products, chili peppers, onions and garlic, and horseradish. Grains Pastries or quick breads with added fat. Meats and other proteins High-fat meats, such as fatty beef or pork, hot dogs, ribs, ham, sausage, salami, and bacon. Fried meat or protein, including fried fish and fried chicken. Nuts and nut butters, in large amounts. Dairy Whole milk and chocolate milk. Sour cream. Cream. Ice cream. Cream cheese. Milkshakes. Fats and oils Butter. Margarine. Shortening. Ghee. Beverages Coffee and tea, with or without caffeine. Carbonated beverages. Sodas. Energy drinks. Fruit juice made with acidic fruits, such as orange or grapefruit. Tomato juice. Alcoholic drinks. Sweets and desserts Chocolate and cocoa. Donuts. Seasonings and condiments Pepper. Peppermint and spearmint. Added salt. Any condiments, herbs, or seasonings that cause symptoms. For some people, this may include curry, hot sauce, or vinegar-based salad dressings. The items listed above may not be a complete list of what you should not eat and drink. Contact a food expert for more options. Questions to ask your doctor Diet and lifestyle changes are often the first steps that are taken to manage symptoms of GERD. If diet and lifestyle changes do not help, talk with your doctor about taking medicines. Where  to find more information International Foundation for Gastrointestinal Disorders: aboutgerd.org Summary When you have GERD, food and lifestyle choices are very important in easing your symptoms. Eat small meals often instead of 3 large meals a day. Eat your meals slowly and in a place where you are relaxed. Avoid bending over or lying down until 2-3 hours after eating. Limit high-fat foods such as fatty meats or fried foods. This information is not intended to replace advice given to you by your health care provider. Make sure you discuss any questions you have with your health care provider. Document Revised: 05/04/2020 Document Reviewed: 05/04/2020 Elsevier Patient Education  [place] Exercises Hand exercises can be helpful for almost anyone. These exercises can strengthen the hands, improve flexibility and movement, and increase blood flow to the hands. These results can make work and daily tasks easier. Hand exercises can be especially helpful for people who have joint pain from arthritis or have nerve damage from overuse (carpal tunnel  syndrome). These exercises can also help people who have injured a hand. Exercises Most of these hand exercises are gentle stretching and motion exercises. It is usually safe to do them often throughout the day. Warming up your hands before exercise may help to reduce stiffness. You can do this with gentle massage or by placing your hands in warm water for 10-15 minutes. It is normal to feel some stretching, pulling, tightness, or mild discomfort as you begin new exercises. This will gradually improve. Stop an exercise right away if you feel sudden, severe pain or your pain gets worse. Ask your health care provider which exercises are best for you. Knuckle bend or "claw" fist  Stand or sit with your arm, hand, and all five fingers pointed straight up. Make sure to keep your wrist straight during the exercise. Gently bend your fingers down  toward your palm until the tips of your fingers are touching the top of your palm. Keep your big knuckle straight and just bend the small knuckles in your fingers. Hold this position for __________ seconds. Straighten (extend) your fingers back to the starting position. Repeat this exercise 5-10 times with each hand. Full finger fist  Stand or sit with your arm, hand, and all five fingers pointed straight up. Make sure to keep your wrist straight during the exercise. Gently bend your fingers into your palm until the tips of your fingers are touching the middle of your palm. Hold this position for __________ seconds. Extend your fingers back to the starting position, stretching every joint fully. Repeat this exercise 5-10 times with each hand. Straight fist Stand or sit with your arm, hand, and all five fingers pointed straight up. Make sure to keep your wrist straight during the exercise. Gently bend your fingers at the big knuckle, where your fingers meet your hand, and the middle knuckle. Keep the knuckle at the tips of your fingers straight and try to touch the bottom of your palm. Hold this position for __________ seconds. Extend your fingers back to the starting position, stretching every joint fully. Repeat this exercise 5-10 times with each hand. Tabletop  Stand or sit with your arm, hand, and all five fingers pointed straight up. Make sure to keep your wrist straight during the exercise. Gently bend your fingers at the big knuckle, where your fingers meet your hand, as far down as you can while keeping the small knuckles in your fingers straight. Think of forming a tabletop with your fingers. Hold this position for __________ seconds. Extend your fingers back to the starting position, stretching every joint fully. Repeat this exercise 5-10 times with each hand. Finger spread  Place your hand flat on a table with your palm facing down. Make sure your wrist stays straight as you do  this exercise. Spread your fingers and thumb apart from each other as far as you can until you feel a gentle stretch. Hold this position for __________ seconds. Bring your fingers and thumb tight together again. Hold this position for __________ seconds. Repeat this exercise 5-10 times with each hand. Making circles  Stand or sit with your arm, hand, and all five fingers pointed straight up. Make sure to keep your wrist straight during the exercise. Make a circle by touching the tip of your thumb to the tip of your index finger. Hold for __________ seconds. Then open your hand wide. Repeat this motion with your thumb and each finger on your hand. Repeat this exercise 5-10 times with each  hand. Thumb motion  Sit with your forearm resting on a table and your wrist straight. Your thumb should be facing up toward the ceiling. Keep your fingers relaxed as you move your thumb. Lift your thumb up as high as you can toward the ceiling. Hold for __________ seconds. Bend your thumb across your palm as far as you can, reaching the tip of your thumb for the small finger (pinkie) side of your palm. Hold for __________ seconds. Repeat this exercise 5-10 times with each hand. Grip strengthening  Hold a stress ball or other soft ball in the middle of your hand. Slowly increase the pressure, squeezing the ball as much as you can without causing pain. Think of bringing the tips of your fingers into the middle of your palm. All of your finger joints should bend when doing this exercise. Hold your squeeze for __________ seconds, then relax. Repeat this exercise 5-10 times with each hand. Contact a health care provider if: Your hand pain or discomfort gets much worse when you do an exercise. Your hand pain or discomfort does not improve within 2 hours after you exercise. If you have any of these problems, stop doing these exercises right away. Do not do them again unless your health care provider says that you  can. Get help right away if: You develop sudden, severe hand pain or swelling. If this happens, stop doing these exercises right away. Do not do them again unless your health care provider says that you can. This information is not intended to replace advice given to you by your health care provider. Make sure you discuss any questions you have with your health care provider. Document Revised: 02/11/2021 Document Reviewed: 02/11/2021 Elsevier Patient Education  2022 [place] are taking a medication(s) that can suppress your immune system.  The following immunizations are recommended: Flu annually Covid-19  Td/Tdap (tetanus, diphtheria, pertussis) every 10 years Pneumonia (Prevnar 15 then Pneumovax 23 at least 1 year apart.  Alternatively, can take Prevnar 20 without needing additional dose) Shingrix: 2 doses from 4 weeks to 6 months apart  Please check with your PCP to make sure you are up to date.

## 2021-08-10 DIAGNOSIS — K219 Gastro-esophageal reflux disease without esophagitis: Secondary | ICD-10-CM | POA: Diagnosis not present

## 2021-08-10 DIAGNOSIS — R1011 Right upper quadrant pain: Secondary | ICD-10-CM | POA: Diagnosis not present

## 2021-08-10 DIAGNOSIS — K449 Diaphragmatic hernia without obstruction or gangrene: Secondary | ICD-10-CM | POA: Diagnosis not present

## 2021-08-10 DIAGNOSIS — R0789 Other chest pain: Secondary | ICD-10-CM | POA: Diagnosis not present

## 2021-08-11 ENCOUNTER — Other Ambulatory Visit (HOSPITAL_COMMUNITY): Payer: Self-pay | Admitting: Gastroenterology

## 2021-08-11 DIAGNOSIS — R1011 Right upper quadrant pain: Secondary | ICD-10-CM

## 2021-08-12 ENCOUNTER — Other Ambulatory Visit: Payer: Self-pay

## 2021-08-12 ENCOUNTER — Encounter (HOSPITAL_BASED_OUTPATIENT_CLINIC_OR_DEPARTMENT_OTHER): Payer: Self-pay | Admitting: Obstetrics & Gynecology

## 2021-08-12 ENCOUNTER — Ambulatory Visit (INDEPENDENT_AMBULATORY_CARE_PROVIDER_SITE_OTHER): Payer: Medicare Other | Admitting: Obstetrics & Gynecology

## 2021-08-12 VITALS — BP 139/98 | HR 87 | Ht 66.0 in | Wt 145.5 lb

## 2021-08-12 DIAGNOSIS — Z780 Asymptomatic menopausal state: Secondary | ICD-10-CM | POA: Diagnosis not present

## 2021-08-12 DIAGNOSIS — M797 Fibromyalgia: Secondary | ICD-10-CM

## 2021-08-12 DIAGNOSIS — Z90722 Acquired absence of ovaries, bilateral: Secondary | ICD-10-CM | POA: Diagnosis not present

## 2021-08-12 DIAGNOSIS — R0602 Shortness of breath: Secondary | ICD-10-CM

## 2021-08-12 DIAGNOSIS — K449 Diaphragmatic hernia without obstruction or gangrene: Secondary | ICD-10-CM | POA: Diagnosis not present

## 2021-08-12 DIAGNOSIS — Z23 Encounter for immunization: Secondary | ICD-10-CM | POA: Diagnosis not present

## 2021-08-12 DIAGNOSIS — Z01419 Encounter for gynecological examination (general) (routine) without abnormal findings: Secondary | ICD-10-CM | POA: Diagnosis not present

## 2021-08-12 DIAGNOSIS — Z9079 Acquired absence of other genital organ(s): Secondary | ICD-10-CM

## 2021-08-12 NOTE — Progress Notes (Signed)
65 y.o. G26P2002 Married White or Caucasian female here for breast and pelvic exam.  I am also following her for osteopenia.  Denies vaginal bleeding.   6 yo old son now working in [place] and living in [place], [place].  Saw Dr. Estanislado Pandy earlier this year.  Had recent appointment with Dr. Collene Mares.  Hiatal hernia is worse.  Pt is having worsening GERD with severe pain that extends into her shoulder.    Reports she had [employer] and she got really short of breast walking quickly into a building or walking up stairs.  Patient's last menstrual period was 11/07/2009.          Sexually active: Yes.    H/O STD:  no  Health Maintenance: PCP:  Dr. Philip Aspen.  Last wellness appt was 09/2020.  Did blood work at that appt:  yes Vaccines are up to date:  has not had shingrix vaccination Colonoscopy:  07/10/2019, tubular adenoma, follow up 5 years. MMG:  06/17/2021 Negative BMD:  08/20/2020 Osteopenia Last pap smear:  05/21/2019 Negative.   H/o abnormal pap smear:  no   reports that she has never smoked. She has never used smokeless tobacco. She reports current alcohol use. She reports that she does not use drugs.  Past Medical History:  Diagnosis Date   Acid reflux    Arthritis    hands and neck   Back pain 12/01/1998   compression fracture L1 from sledding accident   Depression    Fibromyalgia    Hearing loss in right ear    Hiatal hernia 11/2017   MVP (mitral valve prolapse)    Hx   SVD (spontaneous vaginal delivery)    x 2    Past Surgical History:  Procedure Laterality Date   BUNIONECTOMY Left 10/16/14   Dr. 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     CALCIUM PO Take by mouth. With magnesium and zinc     Cholecalciferol (VITAMIN D) 125 MCG (5000 UT) CAPS Take by mouth daily.     DULoxetine (CYMBALTA) 60 MG capsule TAKE ONE CAPSULE BY MOUTH DAILY 90 capsule 0   MAGNESIUM MALATE PO Take 250 mg by mouth daily.     omeprazole (PRILOSEC) 40 MG capsule Take 40 mg by mouth daily.     TURMERIC PO Take by mouth.     No current facility-administered medications for this visit.    Family History  Problem Relation Age of Onset   Diabetes Mother    Hearing loss Mother    Cancer Father        GI   Heart disease Sister    Hearing loss Brother    Breast cancer Maternal Aunt    Ovarian cancer Other        MGM, mid 37's    Review of Systems  All other systems reviewed and are negative.  Exam:   BP (!) 139/98 (BP Location:  Right Arm, Patient Position: Sitting, Cuff Size: Small)   Pulse 87   Ht 5\' 6"  (1.676 m)   Wt 145 lb 8 oz (66 kg)   LMP 11/07/2009   BMI 23.48 kg/m   Height: 5\' 6"  (167.6 cm)  General appearance: alert, cooperative and appears stated age Breasts: normal appearance, no masses or tenderness Abdomen: soft, non-tender; bowel sounds normal; no masses,  no organomegaly Lymph nodes: Cervical, supraclavicular, and axillary nodes normal.  No abnormal inguinal nodes palpated Neurologic: Grossly normal  Pelvic: External genitalia:  no lesions              Urethra:  normal appearing urethra with no masses, tenderness or lesions              Bartholins and Skenes: normal                 Vagina: normal appearing vagina with atrophic changes and no discharge, no lesions              Cervix: no lesions              Pap taken: No. Bimanual Exam:  Uterus:  normal size, contour, position, consistency, mobility,  non-tender              Adnexa: normal adnexa and no mass, fullness, tenderness               Rectovaginal: Confirms               Anus:  normal sphincter tone, no lesions  Chaperone, Octaviano Batty, CMA, was present for exam.  Assessment/Plan: 1. Encntr for gyn exam (general) (routine) w/o abn findings - pap 2020, not indicated today - MMG 07/2021 - BMD 2021 - colonoscopy up to date - care gaps reviewed/updated  2. Postmenopausal - no HRT  3. Fibromyalgia  4. S/P BSO (bilateral salpingo-oophorectomy)  5. Hiatal hernia - has upcoming testing scheduled by Dr. Collene Mares  6. Shortness of breath - advised pt to call Dr. Sharlett Iles.  This may all be related to the hiatal hernia but feel some cardiac evaluation is reasonable as well  7.  Osteopenia  Total time spent with pt:  27 minutes

## 2021-08-13 ENCOUNTER — Encounter (HOSPITAL_BASED_OUTPATIENT_CLINIC_OR_DEPARTMENT_OTHER): Payer: Self-pay

## 2021-08-23 ENCOUNTER — Other Ambulatory Visit: Payer: Self-pay

## 2021-08-23 ENCOUNTER — Encounter (HOSPITAL_COMMUNITY)
Admission: RE | Admit: 2021-08-23 | Discharge: 2021-08-23 | Disposition: A | Discharge status: Home / Self Care | Payer: Medicare Other | Source: Ambulatory Visit | Attending: Gastroenterology | Admitting: Gastroenterology

## 2021-08-23 ENCOUNTER — Ambulatory Visit (HOSPITAL_COMMUNITY)
Admission: RE | Admit: 2021-08-23 | Discharge: 2021-08-23 | Disposition: A | Discharge status: Home / Self Care | Payer: Medicare Other | Source: Ambulatory Visit | Attending: Gastroenterology | Admitting: Gastroenterology

## 2021-08-23 DIAGNOSIS — R1011 Right upper quadrant pain: Secondary | ICD-10-CM

## 2021-08-23 DIAGNOSIS — R1110 Vomiting, unspecified: Secondary | ICD-10-CM | POA: Diagnosis not present

## 2021-08-23 MED ORDER — TECHNETIUM TC 99M MEBROFENIN IV KIT
5.5000 | PACK | Freq: Once | INTRAVENOUS | Status: AC | PRN
  Administered 2021-08-23: 5.5 via INTRAVENOUS

## 2021-08-31 DIAGNOSIS — H524 Presbyopia: Secondary | ICD-10-CM | POA: Diagnosis not present

## 2021-08-31 DIAGNOSIS — H43813 Vitreous degeneration, bilateral: Secondary | ICD-10-CM | POA: Diagnosis not present

## 2021-08-31 DIAGNOSIS — H18513 Endothelial corneal dystrophy, bilateral: Secondary | ICD-10-CM | POA: Diagnosis not present

## 2021-08-31 DIAGNOSIS — H2513 Age-related nuclear cataract, bilateral: Secondary | ICD-10-CM | POA: Diagnosis not present

## 2021-09-21 DIAGNOSIS — R079 Chest pain, unspecified: Secondary | ICD-10-CM | POA: Diagnosis not present

## 2021-09-21 DIAGNOSIS — R933 Abnormal findings on diagnostic imaging of other parts of digestive tract: Secondary | ICD-10-CM | POA: Diagnosis not present

## 2021-09-21 DIAGNOSIS — K449 Diaphragmatic hernia without obstruction or gangrene: Secondary | ICD-10-CM | POA: Diagnosis not present

## 2021-09-21 DIAGNOSIS — K219 Gastro-esophageal reflux disease without esophagitis: Secondary | ICD-10-CM | POA: Diagnosis not present

## 2021-09-21 DIAGNOSIS — R112 Nausea with vomiting, unspecified: Secondary | ICD-10-CM | POA: Diagnosis not present

## 2021-09-22 ENCOUNTER — Other Ambulatory Visit: Payer: Self-pay | Admitting: Gastroenterology

## 2021-09-22 DIAGNOSIS — R1310 Dysphagia, unspecified: Secondary | ICD-10-CM

## 2021-09-23 DIAGNOSIS — H2512 Age-related nuclear cataract, left eye: Secondary | ICD-10-CM | POA: Diagnosis not present

## 2021-09-23 DIAGNOSIS — H25812 Combined forms of age-related cataract, left eye: Secondary | ICD-10-CM | POA: Diagnosis not present

## 2021-10-07 DIAGNOSIS — H25811 Combined forms of age-related cataract, right eye: Secondary | ICD-10-CM | POA: Diagnosis not present

## 2021-10-07 DIAGNOSIS — H2511 Age-related nuclear cataract, right eye: Secondary | ICD-10-CM | POA: Diagnosis not present

## 2021-10-07 DIAGNOSIS — H2181 Floppy iris syndrome: Secondary | ICD-10-CM | POA: Diagnosis not present

## 2021-10-07 DIAGNOSIS — S0521XA Ocular laceration and rupture with prolapse or loss of intraocular tissue, right eye, initial encounter: Secondary | ICD-10-CM | POA: Diagnosis not present

## 2021-10-14 DIAGNOSIS — Z79899 Other long term (current) drug therapy: Secondary | ICD-10-CM | POA: Diagnosis not present

## 2021-10-14 DIAGNOSIS — R5383 Other fatigue: Secondary | ICD-10-CM | POA: Diagnosis not present

## 2021-10-14 DIAGNOSIS — Z0000 Encounter for general adult medical examination without abnormal findings: Secondary | ICD-10-CM | POA: Diagnosis not present

## 2021-10-15 ENCOUNTER — Ambulatory Visit
Admission: RE | Admit: 2021-10-15 | Discharge: 2021-10-15 | Disposition: A | Discharge status: Home / Self Care | Payer: Medicare Other | Source: Ambulatory Visit | Attending: Gastroenterology | Admitting: Gastroenterology

## 2021-10-15 DIAGNOSIS — R1310 Dysphagia, unspecified: Secondary | ICD-10-CM

## 2021-10-15 DIAGNOSIS — K449 Diaphragmatic hernia without obstruction or gangrene: Secondary | ICD-10-CM | POA: Diagnosis not present

## 2021-10-15 DIAGNOSIS — K224 Dyskinesia of esophagus: Secondary | ICD-10-CM | POA: Diagnosis not present

## 2021-10-21 DIAGNOSIS — R0609 Other forms of dyspnea: Secondary | ICD-10-CM | POA: Diagnosis not present

## 2021-10-21 DIAGNOSIS — R82998 Other abnormal findings in urine: Secondary | ICD-10-CM | POA: Diagnosis not present

## 2021-10-21 DIAGNOSIS — M797 Fibromyalgia: Secondary | ICD-10-CM | POA: Diagnosis not present

## 2021-10-21 DIAGNOSIS — D649 Anemia, unspecified: Secondary | ICD-10-CM | POA: Diagnosis not present

## 2021-10-21 DIAGNOSIS — M5136 Other intervertebral disc degeneration, lumbar region: Secondary | ICD-10-CM | POA: Diagnosis not present

## 2021-10-21 DIAGNOSIS — Z0000 Encounter for general adult medical examination without abnormal findings: Secondary | ICD-10-CM | POA: Diagnosis not present

## 2021-10-21 DIAGNOSIS — R5382 Chronic fatigue, unspecified: Secondary | ICD-10-CM | POA: Diagnosis not present

## 2021-10-21 DIAGNOSIS — K219 Gastro-esophageal reflux disease without esophagitis: Secondary | ICD-10-CM | POA: Diagnosis not present

## 2021-11-12 ENCOUNTER — Encounter: Payer: Self-pay | Admitting: Cardiology

## 2021-11-12 ENCOUNTER — Other Ambulatory Visit: Payer: Self-pay

## 2021-11-12 ENCOUNTER — Ambulatory Visit: Payer: Medicare Other | Admitting: Cardiology

## 2021-11-12 VITALS — BP 138/80 | HR 86 | Temp 97.8°F | Resp 17 | Ht 66.0 in | Wt 143.2 lb

## 2021-11-12 DIAGNOSIS — R0789 Other chest pain: Secondary | ICD-10-CM | POA: Diagnosis not present

## 2021-11-12 DIAGNOSIS — R0609 Other forms of dyspnea: Secondary | ICD-10-CM

## 2021-11-12 DIAGNOSIS — K449 Diaphragmatic hernia without obstruction or gangrene: Secondary | ICD-10-CM | POA: Insufficient documentation

## 2021-11-12 DIAGNOSIS — K219 Gastro-esophageal reflux disease without esophagitis: Secondary | ICD-10-CM

## 2021-11-12 NOTE — Progress Notes (Signed)
Primary Physician/Referring:  Donnajean Lopes, MD  Patient ID: Carmesha Morocco, female    DOB: Dec 15, 1955, 66 y.o.   MRN: [id number]  Chief Complaint  Patient presents with   New Patient (Initial Visit)   DOE   Pre-op Exam   HPI:    Kaysey Berndt  is a 66 y.o. Caucasian female patient with no significant cardiovascular history except for elevated blood pressure without diagnosis of hypertension, referred to me for evaluation of chest pain with radiation to the arms, dyspnea on exertion ongoing for several years.  She has no PND or orthopnea.    All episodes of chest pain occur with food and not with exertional activity, relieved with PPI and also Tums.  For similar symptoms in the remote past she has had a treadmill stress test and echocardiogram was told to be normal.  In terms have been worsening over the past 1 to 2 years especially in the last several months.  Past Medical History:  Diagnosis Date   Acid reflux    Arthritis    hands and neck   Back pain 12/01/1998   compression fracture L1 from sledding accident   Depression    Fibromyalgia    Hearing loss in right ear    Hiatal hernia 11/2017   MVP (mitral valve prolapse)    Hx   SVD (spontaneous vaginal delivery)    x 2   Past Surgical History:  Procedure Laterality Date   BUNIONECTOMY Left 10/16/14   Dr. 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   LAPAROSCOPIC BILATERAL SALPINGO OOPHERECTOMY Bilateral 05/13/2014   Procedure: LAPAROSCOPIC BILATERAL SALPINGO OOPHORECTOMY;  Surgeon: Lyman Speller, MD;  Location: [facility];  Service: Gynecology;  Laterality: Bilateral;   toe nail removal Left 03/2018   TONSILLECTOMY  age 57   WISDOM TOOTH EXTRACTION  age 52   Family History  Problem Relation Age of Onset   Diabetes Mother    Hearing loss Mother    Cancer Father        GI   Heart disease Sister     Hearing loss Brother    Breast cancer Maternal Aunt    Ovarian cancer Other        MGM, mid 70's    Social History   Tobacco Use   Smoking status: Never   Smokeless tobacco: Never  Substance Use Topics   Alcohol use: Yes    Alcohol/week: 0.0 - 1.0 standard drinks    Comment: occasionally   Marital Status: Married  ROS  Review of Systems  Cardiovascular:  Positive for dyspnea on exertion. Negative for leg swelling.  Gastrointestinal:  Positive for heartburn. Negative for melena.  Objective  Blood pressure 138/80, pulse 86, temperature 97.8 F (36.6 C), temperature source Temporal, resp. rate 17, height 5' 6"  (1.676 m), weight 143 lb 3.2 oz (65 kg), last menstrual period 11/07/2009. Body mass index is 23.11 kg/m.  Vitals with BMI 11/12/2021 08/12/2021 08/03/2021  Height 5' 6"  5' 6"  5' 7"   Weight 143 lbs 3 oz 145 lbs 8 oz 145 lbs 3 oz  BMI 23.12 82.9 93.71  Systolic 696 789 381  Diastolic 80 98 89  Pulse 86 87 80    Physical Exam Neck:     Vascular: No carotid bruit or JVD.  Cardiovascular:     Rate and Rhythm: Normal  rate and regular rhythm.     Pulses: Intact distal pulses.     Heart sounds: Normal heart sounds. No murmur heard.   No gallop.  Pulmonary:     Effort: Pulmonary effort is normal.     Breath sounds: Normal breath sounds.  Abdominal:     General: Bowel sounds are normal.     Palpations: Abdomen is soft.  Musculoskeletal:        General: No swelling.     Laboratory examination:   TSH No results for input(s): TSH in the last 8760 hours.  External labs:   Labs 10/21/2021:  UA normal limits. BUN 16, creatinine 1.0, EGFR 55 mill, potassium 4.4, CMP otherwise normal, serum glucose 94 mg.  Hb 11.1/HCT 34.5, microcytic indicis, platelets 345.  Total cholesterol 189, triglycerides 102, HDL 69, LDL 100.  Non-HDL cholesterol 120. Medications and allergies   Allergies  Allergen Reactions   Nsaids     Pt states she as a gastric ulcer.      Medication prior to this encounter:   Outpatient Medications Prior to Visit  Medication Sig Dispense Refill   acetaminophen (TYLENOL) 500 MG tablet Take 1,000 mg by mouth daily.     CALCIUM PO Take by mouth. With magnesium and zinc     Cholecalciferol (VITAMIN D) 125 MCG (5000 UT) CAPS Take by mouth daily.     DULoxetine (CYMBALTA) 60 MG capsule TAKE ONE CAPSULE BY MOUTH DAILY 90 capsule 0   MAGNESIUM MALATE PO Take 250 mg by mouth daily.     omeprazole (PRILOSEC) 40 MG capsule Take 40 mg by mouth as needed.     TURMERIC PO Take by mouth.     No facility-administered medications prior to visit.     Medication list after today's encounter   Current Outpatient Medications  Medication Instructions   acetaminophen (TYLENOL) 1,000 mg, Oral, Daily   CALCIUM PO Oral, With magnesium and zinc    Cholecalciferol (VITAMIN D) 125 MCG (5000 UT) CAPS Oral, Daily   DULoxetine (CYMBALTA) 60 MG capsule TAKE ONE CAPSULE BY MOUTH DAILY   MAGNESIUM MALATE PO 250 mg, Oral, Daily   omeprazole (PRILOSEC) 40 mg, Oral, As needed   TURMERIC PO Oral    Radiology:   Barium swallow 10/15/2021: 1. Stable moderate to large hiatal hernia with 50-75% of the stomach contained in the chest. 2. Nonspecific esophageal motility disorder.  Cardiac Studies:   NA EKG:   EKG 11/12/2021: Normal sinus rhythm at rate of 84 bpm, left ankle enlargement, incomplete right bundle branch block.    Assessment     ICD-10-CM   1. Atypical chest pain  R07.89 PCV CARDIAC STRESS TEST    2. DOE (dyspnea on exertion)  R06.09 EKG 12-Lead    PCV ECHOCARDIOGRAM COMPLETE    PCV CARDIAC STRESS TEST    3. Gastroesophageal reflux disease with hiatal hernia  K21.9    K44.9       There are no discontinued medications.  No orders of the defined types were placed in this encounter.  Orders Placed This Encounter  Procedures   PCV CARDIAC STRESS TEST    Standing Status:   Future    Standing Expiration Date:   01/10/2022   EKG  12-Lead   PCV ECHOCARDIOGRAM COMPLETE    Standing Status:   Future    Standing Expiration Date:   11/12/2022   Recommendations:   Daianna Vasques is a 66 y.o. Caucasian female patient with no significant cardiovascular history  except for elevated blood pressure without diagnosis of hypertension, referred to me for evaluation of chest pain with radiation to the arms, dyspnea on exertion ongoing for several years.  She has no PND or orthopnea.  All episodes of chest pain occur with food and not with exertional activity, relieved with PPI and also Tums.  For similar symptoms in the remote past she has had a treadmill stress test and echocardiogram was told to be normal.  In terms have been worsening over the past 1 to 2 years especially in the last several months.  Her physical examination, vascular examination especially and cardiac examination are normal.  Normal EKG.  She is appropriate height and weight, normal lipids, blood pressure is normal, non-smoker.  She is overall low risk from cardiac standpoint for angina pectoris.  However as she may need extensive surgery for large hiatal hernia, I will set her up for a routine treadmill exercise stress test and also an echocardiogram to evaluate her symptoms.   I will personally perform the test and if I find abnormalities,  will perform further evaluation. Otherwise unless new on ongoing symptoms(patient advised to contact us), preventive  therapy is recommended. I will then see the patient on a PRN basis.     Adrian Prows, MD, [facility] 11/12/2021, 2:22 PM Office: [phone]     CC: Juanita Craver, MD

## 2021-11-15 ENCOUNTER — Other Ambulatory Visit: Payer: Self-pay

## 2021-11-15 ENCOUNTER — Ambulatory Visit: Payer: Medicare Other

## 2021-11-15 DIAGNOSIS — K449 Diaphragmatic hernia without obstruction or gangrene: Secondary | ICD-10-CM | POA: Diagnosis not present

## 2021-11-15 DIAGNOSIS — R0609 Other forms of dyspnea: Secondary | ICD-10-CM

## 2021-11-21 ENCOUNTER — Encounter: Payer: Self-pay | Admitting: Cardiology

## 2021-11-23 DIAGNOSIS — D0439 Carcinoma in situ of skin of other parts of face: Secondary | ICD-10-CM | POA: Diagnosis not present

## 2021-11-23 DIAGNOSIS — L821 Other seborrheic keratosis: Secondary | ICD-10-CM | POA: Diagnosis not present

## 2021-11-23 DIAGNOSIS — C44311 Basal cell carcinoma of skin of nose: Secondary | ICD-10-CM | POA: Diagnosis not present

## 2021-11-23 DIAGNOSIS — L814 Other melanin hyperpigmentation: Secondary | ICD-10-CM | POA: Diagnosis not present

## 2021-11-23 DIAGNOSIS — D485 Neoplasm of uncertain behavior of skin: Secondary | ICD-10-CM | POA: Diagnosis not present

## 2021-11-23 DIAGNOSIS — Z23 Encounter for immunization: Secondary | ICD-10-CM | POA: Diagnosis not present

## 2021-11-23 DIAGNOSIS — L819 Disorder of pigmentation, unspecified: Secondary | ICD-10-CM | POA: Diagnosis not present

## 2021-11-26 ENCOUNTER — Other Ambulatory Visit: Payer: Self-pay

## 2021-11-26 ENCOUNTER — Encounter: Payer: Self-pay | Admitting: Cardiology

## 2021-11-26 ENCOUNTER — Ambulatory Visit: Payer: Medicare Other

## 2021-11-26 DIAGNOSIS — R0789 Other chest pain: Secondary | ICD-10-CM | POA: Diagnosis not present

## 2021-11-26 DIAGNOSIS — R0609 Other forms of dyspnea: Secondary | ICD-10-CM

## 2021-11-26 LAB — PCV CARDIAC STRESS TEST: ST Depression (mm): 0 mm

## 2021-11-29 ENCOUNTER — Encounter (HOSPITAL_BASED_OUTPATIENT_CLINIC_OR_DEPARTMENT_OTHER): Payer: Self-pay | Admitting: Obstetrics & Gynecology

## 2021-11-30 ENCOUNTER — Ambulatory Visit: Payer: Self-pay | Admitting: General Surgery

## 2021-11-30 ENCOUNTER — Other Ambulatory Visit: Payer: Self-pay | Admitting: General Surgery

## 2021-11-30 NOTE — H&P (View-Only) (Signed)
Chief Complaint: Hernia (hiatal)       History of Present Illness: Courtney Waters is a 66 y.o. female who is seen today as an office consultation at the request of Dr. Collene Mares for evaluation of Hernia (hiatal) .   Patient is a 66 year old female who is referred for a large hiatal hernia. Patient states that she has had issues with swallowing since 2019.  She states that she had some pain in her chest.  She states that this was usually associated with nausea vomiting.  She states that this was helpful in her pain.  She states that liquids and solids can sometimes cause the same issues.  Patient does not have any true reflux.   Patient did have a swallow study which revealed hiatal hernia in 2019.  Patient's had multiple EGDs secondary to family history of esophageal/stomach cancer.   I did review the patient's upper GI which revealed a large hiatal hernia with a probably 75% of her stomach within her chest cavity.   Patient had previous oophorectomy in the past.       Review of Systems: A complete review of systems was obtained from the patient.  I have reviewed this information and discussed as appropriate with the patient.  See HPI as well for other ROS.   Review of Systems  Constitutional: Negative for fever.  HENT: Negative for congestion.   Eyes: Negative for blurred vision.  Respiratory: Negative for cough, shortness of breath and wheezing.   Cardiovascular: Negative for chest pain and palpitations.  Gastrointestinal: Positive for heartburn, nausea and vomiting.  Genitourinary: Negative for dysuria.  Musculoskeletal: Negative for myalgias.  Skin: Negative for rash.  Neurological: Negative for dizziness and headaches.  Psychiatric/Behavioral: Negative for depression and suicidal ideas.  All other systems reviewed and are negative.       Medical History: Past Medical History Past Medical History: Diagnosis Date  Arthritis    GERD (gastroesophageal reflux disease)         There is no problem list on file for this patient.     Past Surgical History Past Surgical History: Procedure Laterality Date  bunion removal N/A    OOPHORECTOMY N/A        Allergies Allergies Allergen Reactions  Nsaids (Non-Steroidal Anti-Inflammatory Drug) Other (See Comments)     Pt states she as a gastric ulcer.      Current Outpatient Medications on File Prior to Visit Medication Sig Dispense Refill  DULoxetine (CYMBALTA) 60 MG DR capsule Take 1 capsule by mouth once daily      acetaminophen (TYLENOL) 500 MG tablet Take by mouth      cholecalciferol (VITAMIN D3) 5,000 unit capsule Take by mouth once daily      omeprazole (PRILOSEC) 40 MG DR capsule TAKE 1 CAPSULE BY MOUTH EVERY DAY 90 DAYS       No current facility-administered medications on file prior to visit.     Family History History reviewed. No pertinent family history.     Social History   Tobacco Use Smoking Status Never Smokeless Tobacco Never     Social History Social History    Socioeconomic History  Marital status: Married Tobacco Use  Smoking status: Never  Smokeless tobacco: Never Vaping Use  Vaping Use: Never used Substance and Sexual Activity  Alcohol use: Yes  Drug use: Never      Objective:     Vitals:   11/15/21 1321 BP: 138/86 Pulse: 106 Temp: 36.1 C (97 F) SpO2: 98% Weight: 65.3  kg (144 lb) Height: 171.5 cm (5' 7.5")   Body mass index is 22.22 kg/m.   Physical Exam Constitutional:      Appearance: Normal appearance.  HENT:     Head: Normocephalic and atraumatic.     Mouth/Throat:     Mouth: Mucous membranes are moist.     Pharynx: Oropharynx is clear.  Eyes:     General: No scleral icterus.    Pupils: Pupils are equal, round, and reactive to light.  Cardiovascular:     Rate and Rhythm: Normal rate and regular rhythm.     Pulses: Normal pulses.     Heart sounds: No murmur heard.   No friction rub. No gallop.  Pulmonary:     Effort: Pulmonary effort  is normal. No respiratory distress.     Breath sounds: Normal breath sounds. No stridor.  Abdominal:     General: Abdomen is flat.  Musculoskeletal:        General: No swelling.  Skin:    General: Skin is warm.  Neurological:     General: No focal deficit present.     Mental Status: She is alert and oriented to person, place, and time. Mental status is at baseline.  Psychiatric:        Mood and Affect: Mood normal.        Thought Content: Thought content normal.        Judgment: Judgment normal.            Assessment and Plan: Diagnoses and all orders for this visit:   Diaphragmatic hernia without obstruction or gangrene     Courtney Waters is a 66 y.o. female    1.  We will proceed to the OR for a robotic hiatal hernia repair with mesh and fundoplication 2. All risks and benefits were discussed with the patient, to generally include infection, bleeding, damage to surrounding structures, possible pneumothorax, and recurrence. Alternatives were offered and described.  All questions were answered and the patient voiced understanding of the procedure and wishes to proceed at this point.

## 2021-11-30 NOTE — H&P (Signed)
Chief Complaint: Hernia (hiatal)       History of Present Illness: Courtney Waters is a 66 y.o. female who is seen today as an office consultation at the request of Dr. Collene Mares for evaluation of Hernia (hiatal) .   Patient is a 66 year old female who is referred for a large hiatal hernia. Patient states that she has had issues with swallowing since 2019.  She states that she had some pain in her chest.  She states that this was usually associated with nausea vomiting.  She states that this was helpful in her pain.  She states that liquids and solids can sometimes cause the same issues.  Patient does not have any true reflux.   Patient did have a swallow study which revealed hiatal hernia in 2019.  Patient's had multiple EGDs secondary to family history of esophageal/stomach cancer.   I did review the patient's upper GI which revealed a large hiatal hernia with a probably 75% of her stomach within her chest cavity.   Patient had previous oophorectomy in the past.       Review of Systems: A complete review of systems was obtained from the patient.  I have reviewed this information and discussed as appropriate with the patient.  See HPI as well for other ROS.   Review of Systems  Constitutional: Negative for fever.  HENT: Negative for congestion.   Eyes: Negative for blurred vision.  Respiratory: Negative for cough, shortness of breath and wheezing.   Cardiovascular: Negative for chest pain and palpitations.  Gastrointestinal: Positive for heartburn, nausea and vomiting.  Genitourinary: Negative for dysuria.  Musculoskeletal: Negative for myalgias.  Skin: Negative for rash.  Neurological: Negative for dizziness and headaches.  Psychiatric/Behavioral: Negative for depression and suicidal ideas.  All other systems reviewed and are negative.       Medical History: Past Medical History Past Medical History: Diagnosis Date  Arthritis    GERD (gastroesophageal reflux disease)         There is no problem list on file for this patient.     Past Surgical History Past Surgical History: Procedure Laterality Date  bunion removal N/A    OOPHORECTOMY N/A        Allergies Allergies Allergen Reactions  Nsaids (Non-Steroidal Anti-Inflammatory Drug) Other (See Comments)     Pt states she as a gastric ulcer.      Current Outpatient Medications on File Prior to Visit Medication Sig Dispense Refill  DULoxetine (CYMBALTA) 60 MG DR capsule Take 1 capsule by mouth once daily      acetaminophen (TYLENOL) 500 MG tablet Take by mouth      cholecalciferol (VITAMIN D3) 5,000 unit capsule Take by mouth once daily      omeprazole (PRILOSEC) 40 MG DR capsule TAKE 1 CAPSULE BY MOUTH EVERY DAY 90 DAYS       No current facility-administered medications on file prior to visit.     Family History History reviewed. No pertinent family history.     Social History   Tobacco Use Smoking Status Never Smokeless Tobacco Never     Social History Social History    Socioeconomic History  Marital status: Married Tobacco Use  Smoking status: Never  Smokeless tobacco: Never Vaping Use  Vaping Use: Never used Substance and Sexual Activity  Alcohol use: Yes  Drug use: Never      Objective:     Vitals:   11/15/21 1321 BP: 138/86 Pulse: 106 Temp: 36.1 C (97 F) SpO2: 98% Weight: 65.3  kg (144 lb) Height: 171.5 cm (5' 7.5")   Body mass index is 22.22 kg/m.   Physical Exam Constitutional:      Appearance: Normal appearance.  HENT:     Head: Normocephalic and atraumatic.     Mouth/Throat:     Mouth: Mucous membranes are moist.     Pharynx: Oropharynx is clear.  Eyes:     General: No scleral icterus.    Pupils: Pupils are equal, round, and reactive to light.  Cardiovascular:     Rate and Rhythm: Normal rate and regular rhythm.     Pulses: Normal pulses.     Heart sounds: No murmur heard.   No friction rub. No gallop.  Pulmonary:     Effort: Pulmonary effort  is normal. No respiratory distress.     Breath sounds: Normal breath sounds. No stridor.  Abdominal:     General: Abdomen is flat.  Musculoskeletal:        General: No swelling.  Skin:    General: Skin is warm.  Neurological:     General: No focal deficit present.     Mental Status: She is alert and oriented to person, place, and time. Mental status is at baseline.  Psychiatric:        Mood and Affect: Mood normal.        Thought Content: Thought content normal.        Judgment: Judgment normal.            Assessment and Plan: Diagnoses and all orders for this visit:   Diaphragmatic hernia without obstruction or gangrene     Courtney Waters is a 66 y.o. female    1.  We will proceed to the OR for a robotic hiatal hernia repair with mesh and fundoplication 2. All risks and benefits were discussed with the patient, to generally include infection, bleeding, damage to surrounding structures, possible pneumothorax, and recurrence. Alternatives were offered and described.  All questions were answered and the patient voiced understanding of the procedure and wishes to proceed at this point.

## 2021-12-06 ENCOUNTER — Other Ambulatory Visit: Payer: Self-pay | Admitting: Rheumatology

## 2021-12-06 DIAGNOSIS — M797 Fibromyalgia: Secondary | ICD-10-CM

## 2021-12-06 NOTE — Telephone Encounter (Signed)
Next Visit: 02/01/2022  Last Visit: 08/03/2021  Last Fill: 08/03/2021  Dx: Fibromyalgia  Current Dose per office note on 08/03/2021: Cymbalta 60 mg p.o. daily  Okay to refill Cymbalta?

## 2021-12-08 HISTORY — PX: BASAL CELL CARCINOMA EXCISION: SHX1214

## 2021-12-21 NOTE — Progress Notes (Addendum)
Surgical Instructions    Your procedure is scheduled on 12/28/21.  Report to [facility] Main Entrance "A" at 5:30 A.M., then check in with the Admitting office.  Call this number if you have problems the morning of surgery:  [phone]   If you have any questions prior to your surgery date call [phone]: Open Monday-Friday 8am-4pm    Remember:  Do not eat after midnight the night before your surgery  You may drink clear liquids until 4:30 the morning of your surgery.   Clear liquids allowed are: Water, Non-Citrus Juices (without pulp), Carbonated Beverages, Clear Tea, Black Coffee ONLY (NO MILK, CREAM OR POWDERED CREAMER of any kind), and Gatorade  Please complete your PRE-SURGERY ENSURE that was provided to you by 4:30 the morning of surgery.  Please, if able, drink it in one setting. DO NOT SIP.     Take these medicines the morning of surgery with A SIP OF WATER:  DULoxetine (CYMBALTA) acetaminophen (TYLENOL) omeprazole (PRILOSEC) - if needed   As of today, STOP taking any Aspirin (unless otherwise instructed by your surgeon) Aleve, Naproxen, Ibuprofen, Motrin, Advil, Goody's, BC's, all herbal medications, fish oil, and all vitamins.           Do not wear jewelry or makeup Do not wear lotions, powders, perfumes or deodorant. Do not shave 48 hours prior to surgery.   Do not bring valuables to the hospital. Do not wear nail polish, gel polish, artificial nails, or any other type of covering on natural nails (fingers and toes) If you have artificial nails or gel coating that need to be removed by a nail salon, please have this removed prior to surgery. Artificial nails or gel coating may interfere with anesthesia's ability to adequately monitor your vital signs.  [facility] is not responsible for any belongings or valuables. .   Do NOT Smoke (Tobacco/Vaping)  24 hours prior to your procedure  If you use a CPAP at night, you may bring your mask for your overnight stay.    Contacts, glasses, hearing aids, dentures or partials may not be worn into surgery, please bring cases for these belongings   For patients admitted to the hospital, discharge time will be determined by your treatment team.   Patients discharged the day of surgery will not be allowed to drive home, and someone needs to stay with them for 24 hours.  NO VISITORS WILL BE ALLOWED IN PRE-OP WHERE PATIENTS ARE PREPPED FOR SURGERY.  ONLY 1 SUPPORT PERSON MAY BE PRESENT IN THE WAITING ROOM WHILE YOU ARE IN SURGERY.  IF YOU ARE TO BE ADMITTED, ONCE YOU ARE IN YOUR ROOM YOU WILL BE ALLOWED TWO (2) VISITORS. 1 (ONE) VISITOR MAY STAY OVERNIGHT BUT MUST ARRIVE TO THE ROOM BY 8pm.  Minor children may have two parents present. Special consideration for safety and communication needs will be reviewed on a case by case basis.  Special instructions:    Oral Hygiene is also important to reduce your risk of infection.  Remember - BRUSH YOUR TEETH THE MORNING OF SURGERY WITH YOUR REGULAR TOOTHPASTE   [facility]- Preparing For Surgery  Before surgery, you can play an important role. Because skin is not sterile, your skin needs to be as free of germs as possible. You can reduce the number of germs on your skin by washing with CHG (chlorahexidine gluconate) Soap before surgery.  CHG is an antiseptic cleaner which kills germs and bonds with the skin to continue killing germs even  after washing.     Please do not use if you have an allergy to CHG or antibacterial soaps. If your skin becomes reddened/irritated stop using the CHG.  Do not shave (including legs and underarms) for at least 48 hours prior to first CHG shower. It is OK to shave your face.  Please follow these instructions carefully.     Shower the NIGHT BEFORE SURGERY and the MORNING OF SURGERY with CHG Soap.   If you chose to wash your hair, wash your hair first as usual with your normal shampoo. After you shampoo, rinse your hair and body thoroughly to  remove the shampoo.  Then [employer] and genitals (private parts) with your normal soap and rinse thoroughly to remove soap.  After that Use CHG Soap as you would any other liquid soap. You can apply CHG directly to the skin and wash gently with a scrungie or a clean washcloth.   Apply the CHG Soap to your body ONLY FROM THE NECK DOWN.  Do not use on open wounds or open sores. Avoid contact with your eyes, ears, mouth and genitals (private parts). Wash Face and genitals (private parts)  with your normal soap.   Wash thoroughly, paying special attention to the area where your surgery will be performed.  Thoroughly rinse your body with warm water from the neck down.  DO NOT shower/wash with your normal soap after using and rinsing off the CHG Soap.  Pat yourself dry with a CLEAN TOWEL.  Wear CLEAN PAJAMAS to bed the night before surgery  Place CLEAN SHEETS on your bed the night before your surgery  DO NOT SLEEP WITH PETS.   Day of Surgery:  Take a shower with CHG soap. Wear Clean/Comfortable clothing the morning of surgery Do not apply any deodorants/lotions.   Remember to brush your teeth WITH YOUR REGULAR TOOTHPASTE.    COVID testing  If you are going to stay overnight or be admitted after your procedure/surgery and require a pre-op COVID test, please follow these instructions after your COVID test   You are not required to quarantine however you are required to wear a well-fitting mask when you are out and around people not in your household.  If your mask becomes wet or soiled, replace with a new one.  Wash your hands often with soap and water for 20 seconds or clean your hands with an alcohol-based hand sanitizer that contains at least 60% alcohol.  Do not share personal items.  Notify your provider: if you are in close contact with someone who has COVID  or if you develop a fever of 100.4 or greater, sneezing, cough, sore throat, shortness of breath or body aches.     Please read over the following fact sheets that you were given.

## 2021-12-22 ENCOUNTER — Other Ambulatory Visit: Payer: Self-pay

## 2021-12-22 ENCOUNTER — Encounter (HOSPITAL_COMMUNITY)
Admission: RE | Admit: 2021-12-22 | Discharge: 2021-12-22 | Disposition: A | Discharge status: Home / Self Care | Payer: Medicare Other | Source: Ambulatory Visit | Attending: General Surgery | Admitting: General Surgery

## 2021-12-22 ENCOUNTER — Encounter (HOSPITAL_COMMUNITY): Payer: Self-pay

## 2021-12-22 VITALS — BP 136/83 | HR 92 | Temp 99.4°F | Resp 17 | Ht 67.0 in | Wt 141.0 lb

## 2021-12-22 DIAGNOSIS — Z01812 Encounter for preprocedural laboratory examination: Secondary | ICD-10-CM | POA: Diagnosis not present

## 2021-12-22 DIAGNOSIS — K449 Diaphragmatic hernia without obstruction or gangrene: Secondary | ICD-10-CM | POA: Diagnosis not present

## 2021-12-22 HISTORY — DX: Cardiac murmur, unspecified: R01.1

## 2021-12-22 LAB — CBC
HCT: 36 % (ref 36.0–46.0)
Hemoglobin: 11.5 g/dL — ABNORMAL LOW (ref 12.0–15.0)
MCH: 27.5 pg (ref 26.0–34.0)
MCHC: 31.9 g/dL (ref 30.0–36.0)
MCV: 86.1 fL (ref 80.0–100.0)
Platelets: 347 10*3/uL (ref 150–400)
RBC: 4.18 MIL/uL (ref 3.87–5.11)
RDW: 13.6 % (ref 11.5–15.5)
WBC: 6.4 10*3/uL (ref 4.0–10.5)
nRBC: 0 % (ref 0.0–0.2)

## 2021-12-22 NOTE — Progress Notes (Signed)
PCP - Leanna Battles Cardiologist - Dr. Einar Gip with [facility] cardiology   Chest x-ray - Not indicated EKG - 11/12/21 Stress Test - 11/26/21 ECHO - 11/15/21 Cardiac Cath -Denies   Sleep Study - Denies  DM - Denies  ERAS Protcol - Yes PRE-SURGERY Ensure   COVID TEST- 12/24/21   Anesthesia review: No  Patient denies shortness of breath, fever, cough and chest pain at PAT appointment   All instructions explained to the patient, with a verbal understanding of the material. Patient agrees to go over the instructions while at home for a better understanding. Patient also instructed to wear a mask while in public after being tested for COVID-19. The opportunity to ask questions was provided.

## 2021-12-24 ENCOUNTER — Other Ambulatory Visit (HOSPITAL_COMMUNITY)
Admission: RE | Admit: 2021-12-24 | Discharge: 2021-12-24 | Disposition: A | Discharge status: Home / Self Care | Payer: Medicare Other | Source: Ambulatory Visit | Attending: General Surgery | Admitting: General Surgery

## 2021-12-24 DIAGNOSIS — Z01812 Encounter for preprocedural laboratory examination: Secondary | ICD-10-CM | POA: Diagnosis not present

## 2021-12-24 DIAGNOSIS — Z20822 Contact with and (suspected) exposure to covid-19: Secondary | ICD-10-CM | POA: Insufficient documentation

## 2021-12-24 DIAGNOSIS — Z01818 Encounter for other preprocedural examination: Secondary | ICD-10-CM

## 2021-12-24 LAB — SARS CORONAVIRUS 2 (TAT 6-24 HRS): SARS Coronavirus 2: NEGATIVE

## 2021-12-27 NOTE — Anesthesia Preprocedure Evaluation (Addendum)
Anesthesia Evaluation  Patient identified by MRN, date of birth, ID band Patient awake    Reviewed: Allergy & Precautions, H&P , NPO status , Patient's Chart, lab work & pertinent test results, reviewed documented beta blocker date and time   Airway Mallampati: I  TM Distance: >3 FB Neck ROM: Full    Dental no notable dental hx. (+) Teeth Intact, Dental Advisory Given   Pulmonary neg pulmonary ROS,    Pulmonary exam normal breath sounds clear to auscultation       Cardiovascular Exercise Tolerance: Good negative cardio ROS Normal cardiovascular exam+ Valvular Problems/Murmurs MVP  Rhythm:Regular Rate:Normal   Echocardiogram 11/15/2020:    Left ventricle cavity is normal in size. Normal global wall motion. Normal  diastolic filling pattern, normal LAP. Normal LV systolic function with EF  58%. Calculated EF 58%.  Left atrial cavity is mildly dilated.  Structurally normal mitral valve. Moderate (Grade II) mitral  regurgitation.  Structurally normal tricuspid valve. Mild to moderate tricuspid  regurgitation. No evidence of pulmonary hypertension.   Neuro/Psych PSYCHIATRIC DISORDERS Depression  Neuromuscular disease negative neurological ROS  negative psych ROS   GI/Hepatic negative GI ROS, Neg liver ROS, hiatal hernia, GERD  Medicated,  Endo/Other  negative endocrine ROS  Renal/GU negative Renal ROS  negative genitourinary   Musculoskeletal negative musculoskeletal ROS (+) Arthritis , Osteoarthritis,  Fibromyalgia -Neck and shoulder   Abdominal (+) - obese,   Peds negative pediatric ROS (+)  Hematology negative hematology ROS (+)   Anesthesia Other Findings   Reproductive/Obstetrics negative OB ROS Left ovarian cyst                            Anesthesia Physical  Anesthesia Plan  ASA: 3  Anesthesia Plan: General   Post-op Pain Management: Tylenol PO (pre-op)* and Gabapentin PO  (pre-op)*   Induction: Intravenous  PONV Risk Score and Plan: 3 and Ondansetron, Dexamethasone and Treatment may vary due to age or medical condition  Airway Management Planned: Oral ETT  Additional Equipment:   Intra-op Plan:   Post-operative Plan: Extubation in OR  Informed Consent: I have reviewed the patients History and Physical, chart, labs and discussed the procedure including the risks, benefits and alternatives for the proposed anesthesia with the patient or authorized representative who has indicated his/her understanding and acceptance.     Dental advisory given  Plan Discussed with: CRNA, Anesthesiologist and Surgeon  Anesthesia Plan Comments:        Anesthesia Quick Evaluation

## 2021-12-28 ENCOUNTER — Other Ambulatory Visit: Payer: Self-pay

## 2021-12-28 ENCOUNTER — Encounter (HOSPITAL_COMMUNITY): Payer: Self-pay | Admitting: General Surgery

## 2021-12-28 ENCOUNTER — Observation Stay (HOSPITAL_COMMUNITY)
Admission: AD | Admit: 2021-12-28 | Discharge: 2021-12-29 | Disposition: A | Discharge status: Home / Self Care | Payer: Medicare Other | Attending: General Surgery | Admitting: General Surgery

## 2021-12-28 ENCOUNTER — Encounter (HOSPITAL_COMMUNITY)
Admission: AD | Disposition: A | Discharge status: Home / Self Care | Payer: Self-pay | Source: Home / Self Care | Attending: General Surgery

## 2021-12-28 ENCOUNTER — Ambulatory Visit (HOSPITAL_COMMUNITY): Payer: Medicare Other | Admitting: Anesthesiology

## 2021-12-28 DIAGNOSIS — M797 Fibromyalgia: Secondary | ICD-10-CM | POA: Diagnosis not present

## 2021-12-28 DIAGNOSIS — N83202 Unspecified ovarian cyst, left side: Secondary | ICD-10-CM | POA: Diagnosis not present

## 2021-12-28 DIAGNOSIS — Z9889 Other specified postprocedural states: Secondary | ICD-10-CM

## 2021-12-28 DIAGNOSIS — Z931 Gastrostomy status: Secondary | ICD-10-CM

## 2021-12-28 DIAGNOSIS — E669 Obesity, unspecified: Secondary | ICD-10-CM | POA: Diagnosis not present

## 2021-12-28 DIAGNOSIS — Z79899 Other long term (current) drug therapy: Secondary | ICD-10-CM | POA: Diagnosis not present

## 2021-12-28 DIAGNOSIS — M1990 Unspecified osteoarthritis, unspecified site: Secondary | ICD-10-CM

## 2021-12-28 DIAGNOSIS — K449 Diaphragmatic hernia without obstruction or gangrene: Principal | ICD-10-CM | POA: Insufficient documentation

## 2021-12-28 DIAGNOSIS — K219 Gastro-esophageal reflux disease without esophagitis: Secondary | ICD-10-CM | POA: Diagnosis not present

## 2021-12-28 DIAGNOSIS — I341 Nonrheumatic mitral (valve) prolapse: Secondary | ICD-10-CM | POA: Diagnosis not present

## 2021-12-28 HISTORY — PX: INSERTION OF MESH: SHX5868

## 2021-12-28 HISTORY — PX: XI ROBOTIC ASSISTED HIATAL HERNIA REPAIR: SHX6889

## 2021-12-28 LAB — BASIC METABOLIC PANEL
Anion gap: 7 (ref 5–15)
BUN: 15 mg/dL (ref 8–23)
CO2: 26 mmol/L (ref 22–32)
Calcium: 9.2 mg/dL (ref 8.9–10.3)
Chloride: 104 mmol/L (ref 98–111)
Creatinine, Ser: 1.15 mg/dL — ABNORMAL HIGH (ref 0.44–1.00)
GFR, Estimated: 52 mL/min — ABNORMAL LOW (ref >60)
Glucose, Bld: 119 mg/dL — ABNORMAL HIGH (ref 70–99)
Potassium: 3.5 mmol/L (ref 3.5–5.1)
Sodium: 137 mmol/L (ref 135–145)

## 2021-12-28 SURGERY — REPAIR, HERNIA, HIATAL, ROBOT-ASSISTED
Anesthesia: General | Site: Abdomen

## 2021-12-28 MED ORDER — BUPIVACAINE LIPOSOME 1.3 % IJ SUSP
INTRAMUSCULAR | Status: AC
  Filled 2021-12-28: qty 20

## 2021-12-28 MED ORDER — ONDANSETRON HCL 4 MG/2ML IJ SOLN
INTRAMUSCULAR | Status: DC | PRN
  Administered 2021-12-28: 4 mg via INTRAVENOUS

## 2021-12-28 MED ORDER — MIDAZOLAM HCL 5 MG/5ML IJ SOLN
INTRAMUSCULAR | Status: DC | PRN
  Administered 2021-12-28: 2 mg via INTRAVENOUS

## 2021-12-28 MED ORDER — OXYCODONE HCL 5 MG/5ML PO SOLN: 5.0000 mg | Freq: Once | ORAL | Status: AC | PRN

## 2021-12-28 MED ORDER — ONDANSETRON 4 MG PO TBDP: 4.0000 mg | ORAL_TABLET | Freq: Four times a day (QID) | ORAL | Status: DC | PRN

## 2021-12-28 MED ORDER — PHENYLEPHRINE HCL-NACL 20-0.9 MG/250ML-% IV SOLN
INTRAVENOUS | Status: DC | PRN
  Administered 2021-12-28: 30 ug/min via INTRAVENOUS

## 2021-12-28 MED ORDER — PROPOFOL 10 MG/ML IV BOLUS
INTRAVENOUS | Status: AC
  Filled 2021-12-28: qty 20

## 2021-12-28 MED ORDER — OXYCODONE HCL 5 MG PO TABS
ORAL_TABLET | ORAL | Status: AC
  Filled 2021-12-28: qty 1

## 2021-12-28 MED ORDER — OXYCODONE HCL 5 MG PO TABS
ORAL_TABLET | ORAL | Status: AC
  Filled 2021-12-28: qty 2

## 2021-12-28 MED ORDER — FENTANYL CITRATE (PF) 100 MCG/2ML IJ SOLN
INTRAMUSCULAR | Status: DC | PRN
  Administered 2021-12-28: 100 ug via INTRAVENOUS
  Administered 2021-12-28 (×3): 50 ug via INTRAVENOUS

## 2021-12-28 MED ORDER — EPHEDRINE SULFATE-NACL 50-0.9 MG/10ML-% IV SOSY
PREFILLED_SYRINGE | INTRAVENOUS | Status: DC | PRN
  Administered 2021-12-28: 5 mg via INTRAVENOUS
  Administered 2021-12-28: 10 mg via INTRAVENOUS

## 2021-12-28 MED ORDER — LACTATED RINGERS IV SOLN: INTRAVENOUS | Status: DC

## 2021-12-28 MED ORDER — OXYCODONE HCL 5 MG/5ML PO SOLN
10.0000 mg | ORAL | Status: DC | PRN
  Administered 2021-12-28 – 2021-12-29 (×2): 10 mg via ORAL
  Filled 2021-12-28: qty 10

## 2021-12-28 MED ORDER — OXYCODONE HCL 5 MG PO TABS
5.0000 mg | ORAL_TABLET | Freq: Once | ORAL | Status: AC | PRN
  Administered 2021-12-28: 5 mg via ORAL

## 2021-12-28 MED ORDER — SODIUM CHLORIDE 0.9% FLUSH
INTRAVENOUS | Status: DC | PRN
  Administered 2021-12-28 (×2): 10 mL

## 2021-12-28 MED ORDER — GABAPENTIN 300 MG PO CAPS
300.0000 mg | ORAL_CAPSULE | ORAL | Status: AC
  Administered 2021-12-28: 300 mg via ORAL
  Filled 2021-12-28: qty 1

## 2021-12-28 MED ORDER — PHENYLEPHRINE 40 MCG/ML (10ML) SYRINGE FOR IV PUSH (FOR BLOOD PRESSURE SUPPORT)
PREFILLED_SYRINGE | INTRAVENOUS | Status: DC | PRN
Start: 2021-12-28 — End: 2021-12-28
  Administered 2021-12-28: 160 ug via INTRAVENOUS

## 2021-12-28 MED ORDER — PROPOFOL 10 MG/ML IV BOLUS
INTRAVENOUS | Status: DC | PRN
  Administered 2021-12-28: 100 mg via INTRAVENOUS

## 2021-12-28 MED ORDER — ACETAMINOPHEN 500 MG PO TABS
1000.0000 mg | ORAL_TABLET | ORAL | Status: AC
  Administered 2021-12-28: 1000 mg via ORAL
  Filled 2021-12-28: qty 2

## 2021-12-28 MED ORDER — LIDOCAINE HCL (CARDIAC) PF 100 MG/5ML IV SOSY
PREFILLED_SYRINGE | INTRAVENOUS | Status: DC | PRN
  Administered 2021-12-28: 100 mg via INTRAVENOUS

## 2021-12-28 MED ORDER — ROCURONIUM BROMIDE 100 MG/10ML IV SOLN
INTRAVENOUS | Status: DC | PRN
  Administered 2021-12-28: 100 mg via INTRAVENOUS

## 2021-12-28 MED ORDER — BUPIVACAINE LIPOSOME 1.3 % IJ SUSP
INTRAMUSCULAR | Status: DC | PRN
  Administered 2021-12-28: 20 mL

## 2021-12-28 MED ORDER — ORAL CARE MOUTH RINSE: 15.0000 mL | Freq: Once | OROMUCOSAL | Status: AC

## 2021-12-28 MED ORDER — ACETAMINOPHEN 325 MG PO TABS: 325.0000 mg | ORAL_TABLET | ORAL | Status: DC | PRN

## 2021-12-28 MED ORDER — OXYCODONE HCL 5 MG PO TABS
10.0000 mg | ORAL_TABLET | Freq: Once | ORAL | Status: AC
  Administered 2021-12-28: 10 mg via ORAL

## 2021-12-28 MED ORDER — ONDANSETRON HCL 4 MG/2ML IJ SOLN: 4.0000 mg | Freq: Four times a day (QID) | INTRAMUSCULAR | Status: DC | PRN

## 2021-12-28 MED ORDER — BUPIVACAINE-EPINEPHRINE 0.25% -1:200000 IJ SOLN
INTRAMUSCULAR | Status: DC | PRN
  Administered 2021-12-28: 15 mL

## 2021-12-28 MED ORDER — LIDOCAINE 2% (20 MG/ML) 5 ML SYRINGE
INTRAMUSCULAR | Status: AC
  Filled 2021-12-28: qty 5

## 2021-12-28 MED ORDER — ENSURE PRE-SURGERY PO LIQD: 296.0000 mL | Freq: Once | ORAL | Status: DC

## 2021-12-28 MED ORDER — DEXAMETHASONE SODIUM PHOSPHATE 10 MG/ML IJ SOLN
INTRAMUSCULAR | Status: DC | PRN
  Administered 2021-12-28: 10 mg via INTRAVENOUS

## 2021-12-28 MED ORDER — HYDROMORPHONE HCL 1 MG/ML IJ SOLN
1.0000 mg | INTRAMUSCULAR | Status: DC | PRN
  Administered 2021-12-29: 1 mg via INTRAVENOUS
  Filled 2021-12-28: qty 1

## 2021-12-28 MED ORDER — CEFAZOLIN SODIUM-DEXTROSE 2-4 GM/100ML-% IV SOLN
2.0000 g | INTRAVENOUS | Status: AC
  Administered 2021-12-28: 2 g via INTRAVENOUS
  Filled 2021-12-28: qty 100

## 2021-12-28 MED ORDER — CHLORHEXIDINE GLUCONATE CLOTH 2 % EX PADS: 6.0000 | MEDICATED_PAD | Freq: Once | CUTANEOUS | Status: DC

## 2021-12-28 MED ORDER — BUPIVACAINE-EPINEPHRINE (PF) 0.25% -1:200000 IJ SOLN
INTRAMUSCULAR | Status: AC
  Filled 2021-12-28: qty 30

## 2021-12-28 MED ORDER — ACETAMINOPHEN 160 MG/5ML PO SOLN: 325.0000 mg | ORAL | Status: DC | PRN

## 2021-12-28 MED ORDER — FENTANYL CITRATE (PF) 250 MCG/5ML IJ SOLN
INTRAMUSCULAR | Status: AC
  Filled 2021-12-28: qty 5

## 2021-12-28 MED ORDER — FENTANYL CITRATE (PF) 100 MCG/2ML IJ SOLN
25.0000 ug | INTRAMUSCULAR | Status: DC | PRN
  Administered 2021-12-28: 50 ug via INTRAVENOUS
  Administered 2021-12-28 (×3): 25 ug via INTRAVENOUS

## 2021-12-28 MED ORDER — MEPERIDINE HCL 25 MG/ML IJ SOLN: 6.2500 mg | INTRAMUSCULAR | Status: DC | PRN

## 2021-12-28 MED ORDER — FENTANYL CITRATE (PF) 100 MCG/2ML IJ SOLN
INTRAMUSCULAR | Status: AC
  Filled 2021-12-28: qty 2

## 2021-12-28 MED ORDER — OXYCODONE HCL 5 MG/5ML PO SOLN
5.0000 mg | ORAL | Status: DC | PRN
  Filled 2021-12-28 (×2): qty 5

## 2021-12-28 MED ORDER — ONDANSETRON HCL 4 MG/2ML IJ SOLN
INTRAMUSCULAR | Status: AC
  Filled 2021-12-28: qty 2

## 2021-12-28 MED ORDER — 0.9 % SODIUM CHLORIDE (POUR BTL) OPTIME
TOPICAL | Status: DC | PRN
  Administered 2021-12-28: 1000 mL

## 2021-12-28 MED ORDER — EPHEDRINE 5 MG/ML INJ
INTRAVENOUS | Status: AC
  Filled 2021-12-28: qty 5

## 2021-12-28 MED ORDER — DEXAMETHASONE SODIUM PHOSPHATE 10 MG/ML IJ SOLN
INTRAMUSCULAR | Status: AC
  Filled 2021-12-28: qty 1

## 2021-12-28 MED ORDER — ACETAMINOPHEN 160 MG/5ML PO SOLN
650.0000 mg | Freq: Four times a day (QID) | ORAL | Status: DC | PRN
  Administered 2021-12-29: 650 mg via ORAL
  Filled 2021-12-28: qty 20.3

## 2021-12-28 MED ORDER — CHLORHEXIDINE GLUCONATE 0.12 % MT SOLN
15.0000 mL | Freq: Once | OROMUCOSAL | Status: AC
  Administered 2021-12-28: 15 mL via OROMUCOSAL
  Filled 2021-12-28: qty 15

## 2021-12-28 MED ORDER — SUGAMMADEX SODIUM 200 MG/2ML IV SOLN
INTRAVENOUS | Status: DC | PRN
  Administered 2021-12-28: 200 mg via INTRAVENOUS

## 2021-12-28 MED ORDER — DEXTROSE-NACL 5-0.9 % IV SOLN: INTRAVENOUS | Status: DC

## 2021-12-28 MED ORDER — MIDAZOLAM HCL 2 MG/2ML IJ SOLN
INTRAMUSCULAR | Status: AC
  Filled 2021-12-28: qty 2

## 2021-12-28 MED ORDER — ONDANSETRON HCL 4 MG/2ML IJ SOLN: 4.0000 mg | Freq: Once | INTRAMUSCULAR | Status: DC | PRN

## 2021-12-28 MED ORDER — ROCURONIUM BROMIDE 10 MG/ML (PF) SYRINGE
PREFILLED_SYRINGE | INTRAVENOUS | Status: AC
  Filled 2021-12-28: qty 10

## 2021-12-28 SURGICAL SUPPLY — 66 items
ADH SKN CLS APL DERMABOND .7 (GAUZE/BANDAGES/DRESSINGS) ×1
APL PRP STRL LF DISP 70% ISPRP (MISCELLANEOUS) ×1
APPLIER CLIP 5 13 M/L LIGAMAX5 (MISCELLANEOUS)
APR CLP MED LRG 5 ANG JAW (MISCELLANEOUS)
CANNULA REDUC XI 12-8 STAPL (CANNULA) ×2
CANNULA REDUCER 12-8 DVNC XI (CANNULA) ×1 IMPLANT
CHLORAPREP W/TINT 26 (MISCELLANEOUS) ×2 IMPLANT
CLIP APPLIE 5 13 M/L LIGAMAX5 (MISCELLANEOUS) IMPLANT
COVER MAYO STAND STRL (DRAPES) ×2 IMPLANT
COVER SURGICAL LIGHT HANDLE (MISCELLANEOUS) ×2 IMPLANT
COVER TIP SHEARS 8 DVNC (MISCELLANEOUS) IMPLANT
COVER TIP SHEARS 8MM DA VINCI (MISCELLANEOUS)
DECANTER SPIKE VIAL GLASS SM (MISCELLANEOUS) ×1 IMPLANT
DEFOGGER SCOPE WARMER CLEARIFY (MISCELLANEOUS) ×2 IMPLANT
DERMABOND ADVANCED (GAUZE/BANDAGES/DRESSINGS) ×1
DERMABOND ADVANCED .7 DNX12 (GAUZE/BANDAGES/DRESSINGS) ×1 IMPLANT
DEVICE TROCAR PUNCTURE CLOSURE (ENDOMECHANICALS) ×2 IMPLANT
DRAIN PENROSE 0.5X18 (DRAIN) IMPLANT
DRAPE ARM DVNC X/XI (DISPOSABLE) ×4 IMPLANT
DRAPE CARDIOVASC SPLIT 88X140 (DRAPES) ×2 IMPLANT
DRAPE COLUMN DVNC XI (DISPOSABLE) ×1 IMPLANT
DRAPE DA VINCI XI ARM (DISPOSABLE) ×8
DRAPE DA VINCI XI COLUMN (DISPOSABLE) ×2
DRAPE ORTHO SPLIT 77X108 STRL (DRAPES) ×2
DRAPE SURG ORHT 6 SPLT 77X108 (DRAPES) ×1 IMPLANT
ELECT REM PT RETURN 9FT ADLT (ELECTROSURGICAL) ×2
ELECTRODE REM PT RTRN 9FT ADLT (ELECTROSURGICAL) ×1 IMPLANT
GLOVE SURG SYN 7.5  E (GLOVE) ×2
GLOVE SURG SYN 7.5 E (GLOVE) ×1 IMPLANT
GLOVE SURG SYN 7.5 PF PI (GLOVE) ×3 IMPLANT
GOWN STRL REUS W/ TWL LRG LVL3 (GOWN DISPOSABLE) ×1 IMPLANT
GOWN STRL REUS W/ TWL XL LVL3 (GOWN DISPOSABLE) ×2 IMPLANT
GOWN STRL REUS W/TWL 2XL LVL3 (GOWN DISPOSABLE) ×2 IMPLANT
GOWN STRL REUS W/TWL LRG LVL3 (GOWN DISPOSABLE) ×2
GOWN STRL REUS W/TWL XL LVL3 (GOWN DISPOSABLE) ×2
KIT BASIN OR (CUSTOM PROCEDURE TRAY) ×2 IMPLANT
KIT TURNOVER KIT B (KITS) IMPLANT
MARKER SKIN DUAL TIP RULER LAB (MISCELLANEOUS) ×2 IMPLANT
MESH BIO-A 7X10 SYN MAT (Mesh General) ×2 IMPLANT
NDL INSUFFLATION 14GA 120MM (NEEDLE) ×1 IMPLANT
NEEDLE 22X1 1/2 (OR ONLY) (NEEDLE) ×2 IMPLANT
NEEDLE INSUFFLATION 14GA 120MM (NEEDLE) ×2 IMPLANT
OBTURATOR OPTICAL STANDARD 8MM (TROCAR)
OBTURATOR OPTICAL STND 8 DVNC (TROCAR)
OBTURATOR OPTICALSTD 8 DVNC (TROCAR) IMPLANT
PENCIL SMOKE EVACUATOR (MISCELLANEOUS) IMPLANT
SCISSORS LAP 5X35 DISP (ENDOMECHANICALS) IMPLANT
SEAL CANN UNIV 5-8 DVNC XI (MISCELLANEOUS) ×3 IMPLANT
SEAL XI 5MM-8MM UNIVERSAL (MISCELLANEOUS) ×6
SEALER VESSEL DA VINCI XI (MISCELLANEOUS) ×2
SEALER VESSEL EXT DVNC XI (MISCELLANEOUS) ×1 IMPLANT
SET IRRIG TUBING LAPAROSCOPIC (IRRIGATION / IRRIGATOR) ×2 IMPLANT
SET TUBE SMOKE EVAC HIGH FLOW (TUBING) ×2 IMPLANT
STAPLER CANNULA SEAL DVNC XI (STAPLE) ×1 IMPLANT
STAPLER CANNULA SEAL XI (STAPLE) ×4
STAPLER VISISTAT 35W (STAPLE) IMPLANT
STOPCOCK 4 WAY LG BORE MALE ST (IV SETS) ×2 IMPLANT
SUT ETHIBOND 0 36 GRN (SUTURE) ×4 IMPLANT
SUT ETHIBOND 2 0 SH (SUTURE)
SUT ETHIBOND 2 0 SH 36X2 (SUTURE) IMPLANT
SUT MNCRL AB 4-0 PS2 18 (SUTURE) ×3 IMPLANT
SUT SILK 0 SH 30 (SUTURE) ×3 IMPLANT
SYR 30ML SLIP (SYRINGE) ×2 IMPLANT
TRAY FOLEY MTR SLVR 16FR STAT (SET/KITS/TRAYS/PACK) ×1 IMPLANT
TRAY LAPAROSCOPIC MC (CUSTOM PROCEDURE TRAY) ×2 IMPLANT
TROCAR ADV FIXATION 5X100MM (TROCAR) ×2 IMPLANT

## 2021-12-28 NOTE — Interval H&P Note (Signed)
History and Physical Interval Note:  12/28/2021 7:10 AM  Courtney Waters  has presented today for surgery, with the diagnosis of HIATAL HERNIA.  The various methods of treatment have been discussed with the patient and family. After consideration of risks, benefits and other options for treatment, the patient has consented to  Procedure(s): XI ROBOTIC ASSISTED HIATAL HERNIA REPAIR WITH MESH AND FUNDOPLICATION (N/A) as a surgical intervention.  The patient's history has been reviewed, patient examined, no change in status, stable for surgery.  I have reviewed the patient's chart and labs.  Questions were answered to the patient's satisfaction.     Ralene Ok

## 2021-12-28 NOTE — Anesthesia Procedure Notes (Signed)
Procedure Name: Intubation Date/Time: 12/28/2021 7:37 AM Performed by: Lance Coon, CRNA Pre-anesthesia Checklist: Patient identified, Emergency Drugs available, Suction available, Timeout performed and Patient being monitored Patient Re-evaluated:Patient Re-evaluated prior to induction Oxygen Delivery Method: Circle system utilized Preoxygenation: Pre-oxygenation with 100% oxygen Induction Type: IV induction Ventilation: Mask ventilation without difficulty Laryngoscope Size: Miller and 2 Grade View: Grade I Tube type: Oral Number of attempts: 1 Airway Equipment and Method: Stylet and Oral airway Placement Confirmation: ETT inserted through vocal cords under direct vision, positive ETCO2 and breath sounds checked- equal and bilateral Secured at: 21 cm Tube secured with: Tape Dental Injury: Teeth and Oropharynx as per pre-operative assessment  Comments: SRNA DL x1

## 2021-12-28 NOTE — Op Note (Signed)
12/28/2021  9:28 AM  PATIENT:  Courtney Waters  66 y.o. female  PRE-OPERATIVE DIAGNOSIS:  HIATAL HERNIA  POST-OPERATIVE DIAGNOSIS:  HIATAL HERNIA  PROCEDURE:  Procedure(s): XI ROBOTIC ASSISTED HIATAL HERNIA REPAIR WITH MESH AND TOUPET FUNDOPLICATION (N/A) INSERTION OF MESH (N/A)  SURGEON:  Surgeon(s) and Role:    Ralene Ok, MD - Primary  ASSISTANTS: Pryor Curia, RNFA   ANESTHESIA:   local and general  EBL:  minimal   BLOOD ADMINISTERED:none  DRAINS: none   LOCAL MEDICATIONS USED:  BUPIVICAINE  and OTHER exparel  SPECIMEN:  No Specimen  DISPOSITION OF SPECIMEN:  N/A  COUNTS:  YES  TOURNIQUET:  * No tourniquets in log *  DICTATION: .Dragon Dictation  The patient was taken back to the operating room and placed in the supine position with bilateral SCDs in place. The patient was prepped and draped in the usual sterile fashion. After appropriate antibiotics were confirmed a timeout was called and all facts were verified.   A Veress needle technique was used to insufflate the abdomen to 15 mm of mercury the paramedian stab incision. Subsequent to this an 8 mm trocar was introduced as was a 8 millimeter camera. At this time the subsequent robotic trochars x3, were then placed adjacent to this trocar approximately 8-10 cm away. Each trocar was inserted under direct visualization, there were total of 4 trochars. A 104mm trocar was placed in the midclavicular line.  A 0 vicryl was placed to help with closure at the end of the case.The assistant trocar was then placed in the right lower quadrant under direct visualization. The Nathanson retractor was then visualized inserted into the abdomen and the incision just to the left of the falciform ligament. This was then placed to retract the liver appropriately. At this time the patient was positioned in reverse Trendelenburg.   At this time the robot patient cart was brought to the bedside and placed in good position and the  arms were docked to the trochars appropriately. At this time I proceeded to incised the gastrohepatic ligament.  At this time I proceeded to mobilize the stomach inferiorly and visualize the right crus. The peritoneum over the right crus was incised and right crus was identified. I proceeded to dissect this inferiorly until the left crus was seen joining the right crus. Once the right crus was adequately dissected we turned our to the left crus which was dissected away. This required traction of the stomach to the right side. Once this was visualized we then proceeded to circumferentially dissect the esophagus away from the surrounding tissue. The anterior and posterior vagus was seen along the esophagus at the GE junction.  These were both preserved throughout the entire case. At this time the phrenoesophageal fat pad was dissected away from the esophagus. There was a large-sized hiatal hernia seen. I mobilized the esophagus cephalad approximately 5-6 cm, clearing away the surrounding tissue. The anterior hernia sac was dissected away from the stomach and esophagus.  At this time we turned our attention to the greater curvature the stomach and the omentum was mobilized using the robotic vessel sealer. This was taken up to the greater curvature to the hiatus. This mobilized the entire greater curvature to allow mobilization and the wrap. I then proceeded to bring the greater curvature the stomach posterior to the esophagus, and a shoeshine technique was used to evaluate the mobilization of the greater curvature.   At this time I proceeded to close the hiatus using  interrupted 0 Ethibonds x 3. This brought together the hiatal closure without undue stricture to the esophagus.   A piece of Gore Bio A hiatal mesh was placed over the hiatal closure and sutured to the crus using 0 Ethibonds sutures x 4.  At this time the greater curvature was brought around the esophagus and sutured using 0 silk sutures  interrupted fashion approximately 1 cm apart x3 on each side of the esophagus in a Toupet fashion. A left collar stitch was then used to gastropexy the stomach from the wrap to the diaphragm just lateral to the left crus as.  A second collar stitch was placed from the wrap to the right crus.  The wrap lay loose with no strangulation of the esophagus.  At this time the robot was undocked. The liver trocar was removed. At this time insufflation was evacuated. Skin was reapproximated for Monocryl subcuticular fashion. The skin was then dressed with Dermabond. The patient tolerated the procedure well and was taken to the recovery room in stable condition.  PLAN OF CARE: Admit for overnight observation  PATIENT DISPOSITION:  PACU - hemodynamically stable.   Delay start of Pharmacological VTE agent (>24hrs) due to surgical blood loss or risk of bleeding: not applicable

## 2021-12-28 NOTE — Discharge Instructions (Signed)

## 2021-12-28 NOTE — Transfer of Care (Signed)
Immediate Anesthesia Transfer of Care Note  Patient: [name]  Procedure(s) Performed: XI ROBOTIC ASSISTED HIATAL HERNIA REPAIR WITH MESH AND FUNDOPLICATION (Abdomen) INSERTION OF MESH (Abdomen)  Patient Location: PACU  Anesthesia Type:General  Level of Consciousness: drowsy and patient cooperative  Airway & Oxygen Therapy: Patient Spontanous Breathing and Patient connected to nasal cannula oxygen  Post-op Assessment: Report given to RN and Post -op Vital signs reviewed and stable  Post vital signs: Reviewed and stable  Last Vitals:  Vitals Value Taken Time  BP 155/85 12/28/21 0952  Temp 36.7 C 12/28/21 0952  Pulse 93 12/28/21 0952  Resp 11 12/28/21 0952  SpO2 93 % 12/28/21 0952  Vitals shown include unvalidated device data.  Last Pain:  Vitals:   12/28/21 0601  TempSrc: Oral         Complications: No notable events documented.

## 2021-12-28 NOTE — Anesthesia Postprocedure Evaluation (Signed)
Anesthesia Post Note  Patient: Keymani Glynn  Procedure(s) Performed: XI ROBOTIC ASSISTED HIATAL HERNIA REPAIR WITH MESH AND FUNDOPLICATION (Abdomen) INSERTION OF MESH (Abdomen)     Patient location during evaluation: PACU Anesthesia Type: General Level of consciousness: awake and alert Pain management: pain level controlled Vital Signs Assessment: post-procedure vital signs reviewed and stable Respiratory status: spontaneous breathing, nonlabored ventilation, respiratory function stable and patient connected to nasal cannula oxygen Cardiovascular status: blood pressure returned to baseline and stable Postop Assessment: no apparent nausea or vomiting Anesthetic complications: no   No notable events documented.  Last Vitals:  Vitals:   12/28/21 1050 12/28/21 1120  BP: 140/84 (!) 143/80  Pulse: 87 86  Resp: 13 19  Temp:    SpO2: 95% 95%    Last Pain:  Vitals:   12/28/21 1050  TempSrc:   PainSc: Asleep                 Marcquis Ridlon

## 2021-12-29 ENCOUNTER — Inpatient Hospital Stay (HOSPITAL_COMMUNITY): Payer: Medicare Other

## 2021-12-29 ENCOUNTER — Encounter (HOSPITAL_COMMUNITY): Payer: Self-pay | Admitting: General Surgery

## 2021-12-29 DIAGNOSIS — E669 Obesity, unspecified: Secondary | ICD-10-CM | POA: Diagnosis not present

## 2021-12-29 DIAGNOSIS — K219 Gastro-esophageal reflux disease without esophagitis: Secondary | ICD-10-CM | POA: Diagnosis not present

## 2021-12-29 DIAGNOSIS — Z79899 Other long term (current) drug therapy: Secondary | ICD-10-CM | POA: Diagnosis not present

## 2021-12-29 DIAGNOSIS — K449 Diaphragmatic hernia without obstruction or gangrene: Secondary | ICD-10-CM | POA: Diagnosis not present

## 2021-12-29 LAB — BASIC METABOLIC PANEL
Anion gap: 9 (ref 5–15)
BUN: 8 mg/dL (ref 8–23)
CO2: 22 mmol/L (ref 22–32)
Calcium: 8.7 mg/dL — ABNORMAL LOW (ref 8.9–10.3)
Chloride: 106 mmol/L (ref 98–111)
Creatinine, Ser: 0.93 mg/dL (ref 0.44–1.00)
GFR, Estimated: >60 mL/min (ref >60)
Glucose, Bld: 134 mg/dL — ABNORMAL HIGH (ref 70–99)
Potassium: 4 mmol/L (ref 3.5–5.1)
Sodium: 137 mmol/L (ref 135–145)

## 2021-12-29 MED ORDER — HYDROCODONE-ACETAMINOPHEN 7.5-325 MG/15ML PO SOLN: 15.0000 mL | Freq: Four times a day (QID) | ORAL | 0 refills | Status: DC | PRN

## 2021-12-29 MED ORDER — IOHEXOL 300 MG/ML  SOLN: 100.0000 mL | Freq: Once | INTRAMUSCULAR | Status: DC | PRN

## 2021-12-29 NOTE — Hospital Course (Signed)
Patient is a 66 year old female status post robotic hiatal hernia repair with mesh and fundoplication.  Please see op note for full details. Postoperative patient was on a liquid diet and tolerated that well.  Patient underwent esophagram which revealed no leaks.  Patient was then advanced to pured diet.  She will be discharged.  Diet.  Otherwise patient's pain was well controlled.  She was ambulating well on her own.  Had good bowel function.  Patient was deemed stable for discharge and discharged home.

## 2021-12-29 NOTE — Discharge Summary (Signed)
Physician Discharge Summary  Patient ID: Courtney Waters MRN: [id number] DOB/AGE: 68-Jan-1957 68 y.o.  Admit date: 12/28/2021 Discharge date: 12/29/2021  Admission Diagnoses:  Discharge Diagnoses:  Principal Problem:   S/P Nissen fundoplication (without gastrostomy tube) procedure   Discharged Condition: good  Hospital Course: Patient is a 66 year old female status post robotic hiatal hernia repair with mesh and fundoplication.  Please see op note for full details. Postoperative patient was on a liquid diet and tolerated that well.  Patient underwent esophagram which revealed no leaks.  Patient was then advanced to pured diet.  She will be discharged.  Diet.  Otherwise patient's pain was well controlled.  She was ambulating well on her own.  Had good bowel function.  Patient was deemed stable for discharge and discharged home.  Consults: None  Significant Diagnostic Studies: DG esop without leak  Treatments: surgery: as above  Discharge Exam: Blood pressure 138/82, pulse 88, temperature 98.2 F (36.8 C), temperature source Oral, resp. rate 17, height 5\' 7"  (1.702 m), weight 63.5 kg, last menstrual period 11/07/2009, SpO2 95 %. General appearance: alert and cooperative GI: soft, non-tender; bowel sounds normal; no masses,  no organomegaly  Disposition: Discharge disposition: 01-Home or Self Care       Discharge Instructions     Diet - low sodium heart healthy   Complete by: As directed    Increase activity slowly   Complete by: As directed       Allergies as of 12/29/2021       Reactions   Nsaids    Pt states she as a gastric ulcer.        Medication List     TAKE these medications    acetaminophen 650 MG CR tablet Commonly known as: TYLENOL Take 1,300 mg by mouth every 8 (eight) hours as needed for pain.   CALCIUM-MAGNESIUM-ZINC PO Take 1 tablet by mouth daily.   DULoxetine 60 MG capsule Commonly known as: CYMBALTA TAKE ONE CAPSULE BY MOUTH  DAILY   fluorouracil 5 % cream Commonly known as: EFUDEX Apply 1 application topically daily.   HYDROcodone-acetaminophen 7.5-325 mg/15 ml solution Commonly known as: HYCET Take 15 mLs by mouth 4 (four) times daily as needed for moderate pain.   MAGNESIUM MALATE PO Take 2 capsules by mouth daily.   omeprazole 40 MG capsule Commonly known as: PRILOSEC Take 40 mg by mouth daily as needed (acid reflux).   SYSTANE OP Place 1 drop into both eyes daily as needed (dry eyes).   TURMERIC PO Take 2 capsules by mouth daily.   Vitamin D 125 MCG (5000 UT) Caps Take 5,000 Units by mouth daily.        Follow-up Information     Ralene Ok, MD. Schedule an appointment as soon as possible for a visit in 2 week(s).   Specialty: General Surgery Why: Post op visit Contact information: [address] [place] [id number] [phone]                 Signed: Ralene Ok 12/29/2021, 7:08 PM

## 2021-12-29 NOTE — Progress Notes (Signed)
Nutrition Education Note  RD consulted for nutrition education regarding diet instructions s/p Nissen (pureed diet for 2 weeks post-op).    Discussed pureed diet recommendations with patient and her husband. Encouraged patient to blend foods using a blender or food processor and strain to obtain desired consistency (no lumps). Also encouraged use of commercial pureed foods and oral nutritional supplements such as Ensure to provide adequate calories and protein. Discussed how to modify recipes to add calories and protein.    RD discussed why it is important for patient to adhere to diet recommendations and discussed possibility of diet advancement upon MD follow-up. Teach back method used.   Expect good compliance.   Body mass index is 32.78 kg/m. Pt meets criteria for obesity based on current BMI.   Labs and medications reviewed. Plans for discharge home today. No further nutrition interventions warranted at this time. RD contact information provided. If additional nutrition issues arise, please re-consult RD.    Lucas Mallow RD, LDN, CNSC Please refer to Amion for contact information.

## 2021-12-29 NOTE — Progress Notes (Signed)
Mobility Specialist Progress Note:   12/29/21 1100  Mobility  Activity Ambulated with assistance in hallway  Level of Assistance Contact guard assist, steadying assist  Assistive Device None  Distance Ambulated (ft) 1120 ft  Activity Response Tolerated well  $Mobility charge 1 Mobility   Pt received in bed willing to participate in mobility. No complaints of pain. Pt left in bed with call bell in reach and all needs met.   [facility] Public librarian Phone [phone]

## 2021-12-29 NOTE — Progress Notes (Signed)
1 Day Post-Op   Subjective/Chief Complaint: Pt doing well post op  Min pain Tol CLD well   Objective: Vital signs in last 24 hours: Temp:  [97.7 F (36.5 C)-98.7 F (37.1 C)] 98.5 F (36.9 C) (02/22 0811) Pulse Rate:  [78-99] 88 (02/22 0811) Resp:  [10-20] 15 (02/22 0811) BP: (120-161)/(72-92) 120/72 (02/22 0811) SpO2:  [86 %-99 %] 91 % (02/22 0811)    Intake/Output from previous day: 02/21 0701 - 02/22 0700 In: 3688.1 [P.O.:460; I.V.:3228.1] Out: 5 [Blood:5] Intake/Output this shift: Total I/O In: 250 [P.O.:250] Out: -   General appearance: alert and cooperative GI: soft, non-tender; bowel sounds normal; no masses,  no organomegaly and inc c/d/i  Lab Results:  No results for input(s): WBC, HGB, HCT, PLT in the last 72 hours. BMET Recent Labs    12/28/21 0630 12/29/21 0206  NA 137 137  K 3.5 4.0  CL 104 106  CO2 26 22  GLUCOSE 119* 134*  BUN 15 8  CREATININE 1.15* 0.93  CALCIUM 9.2 8.7*   Assessment/Plan: s/p Procedure(s): XI ROBOTIC ASSISTED HIATAL HERNIA REPAIR WITH MESH AND FUNDOPLICATION (N/A) INSERTION OF MESH (N/A) Discharge later today if DG esoph OK  mobilize  LOS: 1 day    Ralene Ok 12/29/2021

## 2021-12-29 NOTE — Progress Notes (Signed)
Discharge instructions provided to patient. Patient verbalizes understanding. Nutritionist consulted and came to speak to the patient. Patient verbalized understanding. Patient discharged

## 2022-01-18 NOTE — Progress Notes (Signed)
? ?Office Visit Note ? ?Patient: Courtney Waters             ?Date of Birth: Mar 04, 1956           ?MRN: [id number]             ?PCP: Donnajean Lopes, MD ?Referring: Donnajean Lopes, MD ?Visit Date: 02/01/2022 ?Occupation: '@GUAROCC'$ @ ? ?Subjective:  ?Discuss medications  ? ?History of Present Illness: Courtney Waters is a 66 y.o. female with history of fibromyalgia, osteoarthritis, and DDD.  Patient reports that she underwent cataract surgery for both eyes November 2022.  She states that she had a hiatal hernia repair on 12/28/2021 which went well without any complications.  She is no longer having to take omeprazole.  She has upcoming appointment with her surgeon for 6-week follow-up.  She has returned to water aerobics last week and has gradually been increasing her exercise regimen.  She reports that she has been off of Cymbalta since the day of her surgery on 12/28/2021.  She states she has had less brain fog and fatigue first thing in the morning since discontinuing Cymbalta.  She is apprehensive to restart on Cymbalta since she has been doing well without it.  She states that she continues to have pain in both hands, both knees, and the right trochanteric bursa.  She has been taking Tylenol as needed for pain relief.  She denies any joint swelling at this time. ? ? ?Activities of Daily Living:  ?Patient reports joint stiffness all day  ?Patient Reports nocturnal pain.  ?Difficulty dressing/grooming: Denies ?Difficulty climbing stairs: Reports ?Difficulty getting out of chair: Reports ?Difficulty using hands for taps, buttons, cutlery, and/or writing: Reports ? ?Review of Systems  ?Constitutional:  Positive for fatigue.  ?HENT:  Positive for mouth dryness.   ?Eyes:  Positive for dryness.  ?Respiratory:  Negative for shortness of breath.   ?Cardiovascular:  Negative for swelling in legs/feet.  ?Gastrointestinal:  Positive for diarrhea.  ?Endocrine: Positive for cold intolerance, heat intolerance,  excessive thirst and increased urination.  ?Genitourinary:  Negative for difficulty urinating.  ?Musculoskeletal:  Positive for joint pain, joint pain and morning stiffness.  ?Skin:  Negative for rash.  ?Allergic/Immunologic: Negative for susceptible to infections.  ?Neurological:  Negative for numbness.  ?Hematological:  Positive for bruising/bleeding tendency.  ?Psychiatric/Behavioral:  Positive for sleep disturbance.   ? ?PMFS History:  ?Patient Active Problem List  ? Diagnosis Date Noted  ? S/P Nissen fundoplication (without gastrostomy tube) procedure 12/28/2021  ? Gastroesophageal reflux disease with hiatal hernia 11/12/2021  ? S/P BSO (bilateral salpingo-oophorectomy) 08/12/2021  ? Hiatal hernia 08/12/2021  ? Primary osteoarthritis of both knees 07/25/2017  ? Screening for osteoporosis 07/25/2017  ? DDD (degenerative disc disease), lumbar 07/25/2017  ? Primary osteoarthritis of both hands 01/26/2017  ? Primary osteoarthritis of both feet 01/26/2017  ? Other idiopathic scoliosis, lumbar region 01/26/2017  ? Fibromyalgia 02/05/2015  ? Postmenopausal 08/07/2014  ? METATARSALGIA 08/14/2007  ? BUNIONS, BILATERAL 08/14/2007  ? UNEQUAL LEG LENGTH, ACQUIRED 08/14/2007  ? HEEL PAIN, LEFT 08/08/2007  ? Osteopenia 08/08/2007  ?  ?Past Medical History:  ?Diagnosis Date  ? Acid reflux   ? Arthritis   ? hands and neck  ? Back pain 12/01/1998  ? compression fracture L1 from sledding accident  ? Depression   ? Fibromyalgia   ? Hearing loss in right ear   ? Heart murmur   ? Hiatal hernia 11/2017  ? MVP (mitral valve prolapse)   ?  Hx  ? SVD (spontaneous vaginal delivery)   ? x 2  ?  ?Family History  ?Problem Relation Age of Onset  ? Diabetes Mother   ? Hearing loss Mother   ? Cancer Father   ?     GI  ? Heart disease Sister   ? Hearing loss Brother   ? Breast cancer Maternal Aunt   ? Ovarian cancer Other   ?     MGM, mid 39's  ? ?Past Surgical History:  ?Procedure Laterality Date  ? BUNIONECTOMY Left 10/16/2014  ? Dr.  Durward Fortes  ? COLONOSCOPY    ? EXTERNAL EAR SURGERY  1959  with several surgeries  ? 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 EYE SURGERY Bilateral 2022  ? cataract surgery L eye 09/23/21 R eye 10/07/21  ? INSERTION OF MESH N/A 12/28/2021  ? Procedure: INSERTION OF MESH;  Surgeon: Ralene Ok, MD;  Location: [place];  Service: General;  Laterality: N/A;  ? LAPAROSCOPIC BILATERAL SALPINGO OOPHERECTOMY Bilateral 05/13/2014  ? Procedure: LAPAROSCOPIC BILATERAL SALPINGO OOPHORECTOMY;  Surgeon: Lyman Speller, MD;  Location: [facility];  Service: Gynecology;  Laterality: Bilateral;  ? toe nail removal Left 03/2018  ? TONSILLECTOMY  age 69  ? WISDOM TOOTH EXTRACTION  age 73  ? XI ROBOTIC ASSISTED HIATAL HERNIA REPAIR N/A 12/28/2021  ? Procedure: XI ROBOTIC ASSISTED HIATAL HERNIA REPAIR WITH MESH AND FUNDOPLICATION;  Surgeon: Ralene Ok, MD;  Location: [place];  Service: General;  Laterality: N/A;  ? ?Social History  ? ?Social History Narrative  ? Not on file  ? ?Immunization History  ?Administered Date(s) Administered  ? Fluad Quad(high Dose 65+) 08/12/2021  ? Influenza,inj,Quad PF,6+ Mos 07/23/2019  ? PFIZER(Purple Top)SARS-COV-2 Vaccination 12/19/2019, 01/13/2020, 09/20/2020  ? Tdap 01/31/2017  ?  ? ?Objective: ?Vital Signs: BP 113/73 (BP Location: Left Arm, Patient Position: Sitting, Cuff Size: Normal)   Pulse 97   Resp 15   Ht 5' 7.5" (1.715 m)   Wt 134 lb (60.8 kg)   LMP 11/07/2009   BMI 20.68 kg/m?   ? ?Physical Exam ?Vitals and nursing note reviewed.  ?Constitutional:   ?   Appearance: She is well-developed.  ?HENT:  ?   Head: Normocephalic and atraumatic.  ?Eyes:  ?   Conjunctiva/sclera: Conjunctivae normal.  ?Cardiovascular:  ?   Rate and Rhythm: Normal rate and regular rhythm.  ?   Heart sounds: Normal heart sounds.  ?Pulmonary:  ?   Effort: Pulmonary effort is normal.  ?   Breath sounds: Normal breath sounds.  ?Abdominal:  ?   General: Bowel sounds are normal.  ?    Palpations: Abdomen is soft.  ?Musculoskeletal:  ?   Cervical back: Normal range of motion.  ?Skin: ?   General: Skin is warm and dry.  ?   Capillary Refill: Capillary refill takes less than 2 seconds.  ?Neurological:  ?   Mental Status: She is alert and oriented to person, place, and time.  ?Psychiatric:     ?   Behavior: Behavior normal.  ?  ? ?Musculoskeletal Exam: Generalized hyperalgesia and positive tender points on examination.  C-spine has good range of motion.  Trapezius muscle tension and tenderness bilaterally.  Shoulder joints, elbow joints, wrist joints, MCPs, PIPs, DIPs have good range of motion with no synovitis.  Mild PIP and DIP prominence consistent with osteoarthritic changes.  Complete fist formation bilaterally.  Hip joints have good range of motion with no groin  pain.  Tenderness palpation over the right trochanteric bursa.  Knee joints have good range of motion with no warmth or effusion.  Ankle joints have good range of motion with no tenderness or joint swelling. ? ?CDAI Exam: ?CDAI Score: -- ?Patient Global: --; Provider Global: -- ?Swollen: --; Tender: -- ?Joint Exam 02/01/2022  ? ?No joint exam has been documented for this visit  ? ?There is currently no information documented on the homunculus. Go to the Rheumatology activity and complete the homunculus joint exam. ? ?Investigation: ?No additional findings. ? ?Imaging: ?XR KNEE 3 VIEW LEFT ? ?Result Date: 02/01/2022 ?No significant medial or lateral compartment narrowing was noted.  Moderate patellofemoral joint was noted.  No chondrocalcinosis was noted. Impression: These findings are consistent with moderate chondromalacia patella . ? ?XR KNEE 3 VIEW RIGHT ? ?Result Date: 02/01/2022 ?Mild lateral compartment narrowing was noted.  No chondrocalcinosis was noted.  Severe patellofemoral narrowing was noted. Impression: These findings are consistent with mild osteoarthritis and severe chondromalacia patella.  ? ?Recent Labs: ?Lab Results   ?Component Value Date  ? WBC 6.4 12/22/2021  ? HGB 11.5 (L) 12/22/2021  ? PLT 347 12/22/2021  ? NA 137 12/29/2021  ? K 4.0 12/29/2021  ? CL 106 12/29/2021  ? CO2 22 12/29/2021  ? GLUCOSE 134 (H) 12/29/2021  ? BUN 8 12/29/2021  ? CR

## 2022-02-01 ENCOUNTER — Telehealth: Payer: Self-pay

## 2022-02-01 ENCOUNTER — Other Ambulatory Visit: Payer: Self-pay

## 2022-02-01 ENCOUNTER — Ambulatory Visit (INDEPENDENT_AMBULATORY_CARE_PROVIDER_SITE_OTHER): Payer: Medicare Other

## 2022-02-01 ENCOUNTER — Encounter: Payer: Self-pay | Admitting: Physician Assistant

## 2022-02-01 ENCOUNTER — Ambulatory Visit (INDEPENDENT_AMBULATORY_CARE_PROVIDER_SITE_OTHER): Payer: Medicare Other | Admitting: Physician Assistant

## 2022-02-01 VITALS — BP 113/73 | HR 97 | Resp 15 | Ht 67.5 in | Wt 134.0 lb

## 2022-02-01 DIAGNOSIS — M25561 Pain in right knee: Secondary | ICD-10-CM

## 2022-02-01 DIAGNOSIS — M19072 Primary osteoarthritis, left ankle and foot: Secondary | ICD-10-CM

## 2022-02-01 DIAGNOSIS — G8929 Other chronic pain: Secondary | ICD-10-CM | POA: Diagnosis not present

## 2022-02-01 DIAGNOSIS — M8589 Other specified disorders of bone density and structure, multiple sites: Secondary | ICD-10-CM | POA: Diagnosis not present

## 2022-02-01 DIAGNOSIS — M25562 Pain in left knee: Secondary | ICD-10-CM

## 2022-02-01 DIAGNOSIS — M19071 Primary osteoarthritis, right ankle and foot: Secondary | ICD-10-CM | POA: Diagnosis not present

## 2022-02-01 DIAGNOSIS — Z8719 Personal history of other diseases of the digestive system: Secondary | ICD-10-CM

## 2022-02-01 DIAGNOSIS — M170 Bilateral primary osteoarthritis of knee: Secondary | ICD-10-CM

## 2022-02-01 DIAGNOSIS — M4126 Other idiopathic scoliosis, lumbar region: Secondary | ICD-10-CM | POA: Diagnosis not present

## 2022-02-01 DIAGNOSIS — R5383 Other fatigue: Secondary | ICD-10-CM | POA: Diagnosis not present

## 2022-02-01 DIAGNOSIS — K449 Diaphragmatic hernia without obstruction or gangrene: Secondary | ICD-10-CM | POA: Diagnosis not present

## 2022-02-01 DIAGNOSIS — M5136 Other intervertebral disc degeneration, lumbar region: Secondary | ICD-10-CM | POA: Diagnosis not present

## 2022-02-01 DIAGNOSIS — M7061 Trochanteric bursitis, right hip: Secondary | ICD-10-CM

## 2022-02-01 DIAGNOSIS — M797 Fibromyalgia: Secondary | ICD-10-CM

## 2022-02-01 DIAGNOSIS — M19041 Primary osteoarthritis, right hand: Secondary | ICD-10-CM

## 2022-02-01 DIAGNOSIS — M19042 Primary osteoarthritis, left hand: Secondary | ICD-10-CM

## 2022-02-01 MED ORDER — LIDOCAINE HCL 1 % IJ SOLN
1.5000 mL | INTRAMUSCULAR | Status: AC | PRN
  Administered 2022-02-01: 1.5 mL

## 2022-02-01 MED ORDER — TRIAMCINOLONE ACETONIDE 40 MG/ML IJ SUSP
40.0000 mg | INTRAMUSCULAR | Status: AC | PRN
  Administered 2022-02-01: 40 mg via INTRA_ARTICULAR

## 2022-02-01 NOTE — Telephone Encounter (Signed)
Please apply for bilateral knee visco, per Taylor Dale, PA-C. Thanks!  

## 2022-02-01 NOTE — Progress Notes (Signed)
X-rays of the left knee are consistent with moderate chondromalacia patella.  X-rays of the right knee were consistent with mild osteoarthritis and severe chondromalacia patella. Please notify the patient and apply for visco gel injections for both knees.

## 2022-02-02 NOTE — Telephone Encounter (Signed)
VOB submitted for Orthovisc, bilateral knees ?BV pending ? ?

## 2022-02-04 DIAGNOSIS — C44311 Basal cell carcinoma of skin of nose: Secondary | ICD-10-CM | POA: Insufficient documentation

## 2022-02-08 NOTE — Telephone Encounter (Signed)
Please call to schedule Visco injections. ? ?Approved for Orthovisc, bilateral knees ?[place] ?Patient is responsible for 20% coinsurance ?No copay ?Deductible does apply ?No pre-certifications  ?

## 2022-02-24 DIAGNOSIS — C44319 Basal cell carcinoma of skin of other parts of face: Secondary | ICD-10-CM | POA: Diagnosis not present

## 2022-02-24 DIAGNOSIS — C44311 Basal cell carcinoma of skin of nose: Secondary | ICD-10-CM | POA: Diagnosis not present

## 2022-03-07 ENCOUNTER — Ambulatory Visit (INDEPENDENT_AMBULATORY_CARE_PROVIDER_SITE_OTHER): Payer: Medicare Other | Admitting: Physician Assistant

## 2022-03-07 DIAGNOSIS — M170 Bilateral primary osteoarthritis of knee: Secondary | ICD-10-CM

## 2022-03-07 MED ORDER — LIDOCAINE HCL 1 % IJ SOLN
1.5000 mL | INTRAMUSCULAR | Status: AC | PRN
  Administered 2022-03-07: 1.5 mL via INTRA_ARTICULAR

## 2022-03-07 MED ORDER — HYALURONAN 30 MG/2ML IX SOSY
30.0000 mg | PREFILLED_SYRINGE | INTRA_ARTICULAR | Status: AC | PRN
  Administered 2022-03-07: 30 mg via INTRA_ARTICULAR

## 2022-03-07 NOTE — Progress Notes (Signed)
? ?  Procedure Note ? ?Patient: Courtney Waters             ?Date of Birth: [birth year]           ?MRN: [id number]             ?Visit Date: 03/07/2022 ? ?Procedures: ?Visit Diagnoses:  ?1. Primary osteoarthritis of both knees   ? ?Orthovisc #1 bilateral knees, B/B ?Large Joint Inj: bilateral knee on 03/07/2022 11:24 AM ?Indications: pain ?Details: 25 G 1.5 in needle, medial approach ? ?Arthrogram: No ? ?Medications (Right): 1.5 mL lidocaine 1 %; 30 mg Hyaluronan 30 MG/2ML ?Aspirate (Right): 0 mL ?Medications (Left): 1.5 mL lidocaine 1 %; 30 mg Hyaluronan 30 MG/2ML ?Aspirate (Left): 0 mL ?Outcome: tolerated well, no immediate complications ?Procedure, treatment alternatives, risks and benefits explained, specific risks discussed. Consent was given by the patient. Immediately prior to procedure a time out was called to verify the correct patient, procedure, equipment, support staff and site/side marked as required. Patient was prepped and draped in the usual sterile fashion.  ? ? ?Patient tolerated the procedure well.  Aftercare was discussed.  ?Hazel Sams, PA-C  ? ?

## 2022-03-14 ENCOUNTER — Ambulatory Visit (INDEPENDENT_AMBULATORY_CARE_PROVIDER_SITE_OTHER): Payer: Medicare Other | Admitting: Physician Assistant

## 2022-03-14 DIAGNOSIS — M170 Bilateral primary osteoarthritis of knee: Secondary | ICD-10-CM

## 2022-03-14 MED ORDER — LIDOCAINE HCL 1 % IJ SOLN
1.5000 mL | INTRAMUSCULAR | Status: AC | PRN
  Administered 2022-03-14: 1.5 mL via INTRA_ARTICULAR

## 2022-03-14 MED ORDER — HYALURONAN 30 MG/2ML IX SOSY
30.0000 mg | PREFILLED_SYRINGE | INTRA_ARTICULAR | Status: AC | PRN
  Administered 2022-03-14: 30 mg via INTRA_ARTICULAR

## 2022-03-14 NOTE — Progress Notes (Signed)
? ?  Procedure Note ? ?Patient: Courtney Waters             ?Date of Birth: 12-03-1955           ?MRN: [id number]             ?Visit Date: 03/14/2022 ? ?Procedures: ?Visit Diagnoses:  ?1. Primary osteoarthritis of both knees   ? ? ?Orthovisc #2 bilateral knees, B/B ?Large Joint Inj: bilateral knee on 03/14/2022 11:16 AM ?Indications: pain ?Details: 25 G 1.5 in needle, medial approach ? ?Arthrogram: No ? ?Medications (Right): 1.5 mL lidocaine 1 %; 30 mg Hyaluronan 30 MG/2ML ?Aspirate (Right): 0 mL ?Medications (Left): 1.5 mL lidocaine 1 %; 30 mg Hyaluronan 30 MG/2ML ?Aspirate (Left): 0 mL ?Outcome: tolerated well, no immediate complications ?Procedure, treatment alternatives, risks and benefits explained, specific risks discussed. Consent was given by the patient. Immediately prior to procedure a time out was called to verify the correct patient, procedure, equipment, support staff and site/side marked as required. Patient was prepped and draped in the usual sterile fashion.  ? ? ?Patient tolerated the procedures well.  Aftercare was discussed.   ?Hazel Sams, PA-C  ? ?

## 2022-03-21 ENCOUNTER — Ambulatory Visit (INDEPENDENT_AMBULATORY_CARE_PROVIDER_SITE_OTHER): Payer: Medicare Other | Admitting: Physician Assistant

## 2022-03-21 DIAGNOSIS — M170 Bilateral primary osteoarthritis of knee: Secondary | ICD-10-CM | POA: Diagnosis not present

## 2022-03-21 MED ORDER — HYALURONAN 30 MG/2ML IX SOSY
30.0000 mg | PREFILLED_SYRINGE | INTRA_ARTICULAR | Status: AC | PRN
  Administered 2022-03-21: 30 mg via INTRA_ARTICULAR

## 2022-03-21 MED ORDER — LIDOCAINE HCL 1 % IJ SOLN
1.5000 mL | INTRAMUSCULAR | Status: AC | PRN
  Administered 2022-03-21: 1.5 mL via INTRA_ARTICULAR

## 2022-03-21 NOTE — Progress Notes (Signed)
? ?  Procedure Note ? ?Patient: Courtney Waters             ?Date of Birth: 1955-12-31           ?MRN: [id number]             ?Visit Date: 03/21/2022 ? ?Procedures: ?Visit Diagnoses:  ?1. Primary osteoarthritis of both knees   ? ?Orthovisc #3 bilateral knees, B/B ?Large Joint Inj: bilateral knee on 03/21/2022 11:54 AM ?Indications: pain ?Details: 25 G 1.5 in needle, medial approach ? ?Arthrogram: No ? ?Medications (Right): 1.5 mL lidocaine 1 %; 30 mg Hyaluronan 30 MG/2ML ?Aspirate (Right): 0 mL ?Medications (Left): 1.5 mL lidocaine 1 %; 30 mg Hyaluronan 30 MG/2ML ?Aspirate (Left): 0 mL ?Outcome: tolerated well, no immediate complications ?Procedure, treatment alternatives, risks and benefits explained, specific risks discussed. Consent was given by the patient. Immediately prior to procedure a time out was called to verify the correct patient, procedure, equipment, support staff and site/side marked as required. Patient was prepped and draped in the usual sterile fashion.  ? ? ? ?Patient tolerated the procedure well.  Aftercare was discussed.  ?Hazel Sams, PA-C  ?

## 2022-05-16 ENCOUNTER — Other Ambulatory Visit: Payer: Self-pay | Admitting: Obstetrics & Gynecology

## 2022-05-16 DIAGNOSIS — Z1231 Encounter for screening mammogram for malignant neoplasm of breast: Secondary | ICD-10-CM

## 2022-06-09 ENCOUNTER — Telehealth: Payer: Self-pay | Admitting: Rheumatology

## 2022-06-09 DIAGNOSIS — L905 Scar conditions and fibrosis of skin: Secondary | ICD-10-CM | POA: Diagnosis not present

## 2022-06-09 DIAGNOSIS — L810 Postinflammatory hyperpigmentation: Secondary | ICD-10-CM | POA: Diagnosis not present

## 2022-06-09 NOTE — Telephone Encounter (Signed)
Patient advised Dr. Estanislado Pandy did give the okay to refer her. Patient provided ways to refer. She states we can contact Winifred Olive at [phone].

## 2022-06-09 NOTE — Telephone Encounter (Signed)
Okay to refer? 

## 2022-06-09 NOTE — Telephone Encounter (Signed)
Patient left a voicemail stating she would like to join the Providers Referrals Exercise Program (PREP) at [facility].  Patient states a referral is required for her to join and requested a return call to let her know if Dr. Estanislado Pandy would refer her.

## 2022-06-09 NOTE — Telephone Encounter (Signed)
Attempted to contact Courtney Waters for referral. Left message for Courtney Waters to call the office so we may proceed with referral.

## 2022-06-14 NOTE — Telephone Encounter (Signed)
Attempted to contact Pam for referral. Left message for Pam to call the office so we may proceed with referral.   Patient advised I have reached out to [facility] and have left messages for her to call the office to proceed with referral. Patient advised I have not received a call back. Patient states she is out of town but when she gets back into town she will go to [employer] and see what can be done to move forward.

## 2022-06-20 ENCOUNTER — Telehealth: Payer: Self-pay

## 2022-06-20 ENCOUNTER — Telehealth: Payer: Self-pay | Admitting: Rheumatology

## 2022-06-20 ENCOUNTER — Ambulatory Visit
Admission: RE | Admit: 2022-06-20 | Discharge: 2022-06-20 | Disposition: A | Discharge status: Home / Self Care | Payer: Medicare Other | Source: Ambulatory Visit | Attending: Obstetrics & Gynecology | Admitting: Obstetrics & Gynecology

## 2022-06-20 DIAGNOSIS — Z1231 Encounter for screening mammogram for malignant neoplasm of breast: Secondary | ICD-10-CM

## 2022-06-20 NOTE — Telephone Encounter (Signed)
Patient called the office stating she needs a referral to P.R.E.P. at [facility]. Mickel Baas McFell [phone] [email or web address]

## 2022-06-20 NOTE — Telephone Encounter (Signed)
She called me inquiring about attending PREP, will need referral through Epic from her doctor OR send referral via email to [email or web address]; once referral received with enroll her in next class at [facility].

## 2022-06-21 ENCOUNTER — Ambulatory Visit: Payer: Medicare Other

## 2022-06-22 ENCOUNTER — Other Ambulatory Visit: Payer: Self-pay | Admitting: *Deleted

## 2022-06-22 DIAGNOSIS — G8929 Other chronic pain: Secondary | ICD-10-CM

## 2022-06-22 DIAGNOSIS — M4126 Other idiopathic scoliosis, lumbar region: Secondary | ICD-10-CM

## 2022-06-22 DIAGNOSIS — M170 Bilateral primary osteoarthritis of knee: Secondary | ICD-10-CM

## 2022-06-22 DIAGNOSIS — M5136 Other intervertebral disc degeneration, lumbar region: Secondary | ICD-10-CM

## 2022-06-22 DIAGNOSIS — R5383 Other fatigue: Secondary | ICD-10-CM

## 2022-06-22 DIAGNOSIS — M19071 Primary osteoarthritis, right ankle and foot: Secondary | ICD-10-CM

## 2022-06-22 DIAGNOSIS — M7061 Trochanteric bursitis, right hip: Secondary | ICD-10-CM

## 2022-06-22 DIAGNOSIS — M19041 Primary osteoarthritis, right hand: Secondary | ICD-10-CM

## 2022-06-22 DIAGNOSIS — M797 Fibromyalgia: Secondary | ICD-10-CM

## 2022-06-22 DIAGNOSIS — M8589 Other specified disorders of bone density and structure, multiple sites: Secondary | ICD-10-CM

## 2022-06-22 NOTE — Progress Notes (Signed)
We received a copy of the mammogram which is read as normal by the radiologist.  Patient may discuss this further with her ordering physician.

## 2022-06-22 NOTE — Telephone Encounter (Signed)
Referral placed by Ivin Booty C.

## 2022-07-21 NOTE — Progress Notes (Signed)
Office Visit Note  Patient: Courtney Waters             Date of Birth: 29-Apr-1956           MRN: [id number]             PCP: Donnajean Lopes, MD Referring: Donnajean Lopes, MD Visit Date: 08/04/2022 Occupation: '@GUAROCC'$ @  Subjective:  Pain in multiple joints  History of Present Illness: Courtney Waters is a 66 y.o. female with history of fibromyalgia syndrome, osteoarthritis and degenerative disc disease.  She states she continues to have some generalized pain and discomfort from fibromyalgia.  She also has pain and stiffness in her bilateral hands, bilateral knee joints and her feet.  She continues to have some thoracic pain.  Had viscosupplement injections in May 2023 and had a good response to them.   she underwent hiatal hernia repair in February 2023.  Since then her symptoms of reflux have improved.  She had basal cell carcinoma resection from her nose in April 2023.  She had a skin graft and plastic surgery after that.  She realized it was current interfering with her cognition.  Although she has been experiencing increased pain since she stopped Cymbalta.  Activities of Daily Living:  Patient reports morning stiffness for 1 hour.   Patient Reports nocturnal pain.  Difficulty dressing/grooming: Denies Difficulty climbing stairs: Denies Difficulty getting out of chair: Denies Difficulty using hands for taps, buttons, cutlery, and/or writing: Reports  Review of Systems  Constitutional:  Positive for fatigue.  HENT:  Negative for mouth sores and mouth dryness.   Eyes:  Positive for dryness.  Respiratory:  Negative for shortness of breath.   Cardiovascular:  Negative for chest pain and palpitations.  Gastrointestinal:  Positive for constipation and diarrhea. Negative for blood in stool.  Endocrine: Positive for heat intolerance and increased urination.  Genitourinary:  Negative for involuntary urination.  Musculoskeletal:  Positive for joint pain, joint pain,  myalgias, morning stiffness, muscle tenderness and myalgias. Negative for gait problem, joint swelling and muscle weakness.  Skin:  Negative for color change, rash, hair loss and sensitivity to sunlight.  Allergic/Immunologic: Negative for susceptible to infections.  Neurological:  Negative for dizziness and headaches.  Hematological:  Negative for swollen glands.  Psychiatric/Behavioral:  Positive for sleep disturbance. Negative for depressed mood. The patient is not nervous/anxious.     PMFS History:  Patient Active Problem List   Diagnosis Date Noted   S/P Nissen fundoplication (without gastrostomy tube) procedure 12/28/2021   Gastroesophageal reflux disease with hiatal hernia 11/12/2021   S/P BSO (bilateral salpingo-oophorectomy) 08/12/2021   Hiatal hernia 08/12/2021   Primary osteoarthritis of both knees 07/25/2017   Screening for osteoporosis 07/25/2017   DDD (degenerative disc disease), lumbar 07/25/2017   Primary osteoarthritis of both hands 01/26/2017   Primary osteoarthritis of both feet 01/26/2017   Other idiopathic scoliosis, lumbar region 01/26/2017   Fibromyalgia 02/05/2015   Postmenopausal 08/07/2014   METATARSALGIA 08/14/2007   BUNIONS, BILATERAL 08/14/2007   UNEQUAL LEG LENGTH, ACQUIRED 08/14/2007   HEEL PAIN, LEFT 08/08/2007   Osteopenia 08/08/2007    Past Medical History:  Diagnosis Date   Acid reflux    Arthritis    hands and neck   Back pain 12/01/1998   compression fracture L1 from sledding accident   Depression    Fibromyalgia    Hearing loss in right ear    Heart murmur    Hiatal hernia 11/2017   MVP (mitral  valve prolapse)    Hx   SVD (spontaneous vaginal delivery)    x 2    Family History  Problem Relation Age of Onset   Diabetes Mother    Hearing loss Mother    Cancer Father        GI   Heart disease Sister    Hearing loss Brother    Breast cancer Maternal Aunt    Ovarian cancer Other        MGM, mid 4's   Past Surgical History:   Procedure Laterality Date   BASAL CELL CARCINOMA EXCISION  12/2021   nose   BUNIONECTOMY Left 10/16/2014   Dr. 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   EYE SURGERY Bilateral 2022   cataract surgery L eye 09/23/21 R eye 10/07/21   INSERTION OF MESH N/A 12/28/2021   Procedure: INSERTION OF MESH;  Surgeon: Ralene Ok, MD;  Location: [place];  Service: General;  Laterality: N/A;   LAPAROSCOPIC BILATERAL SALPINGO OOPHERECTOMY Bilateral 05/13/2014   Procedure: LAPAROSCOPIC BILATERAL SALPINGO OOPHORECTOMY;  Surgeon: Lyman Speller, MD;  Location: [facility];  Service: Gynecology;  Laterality: Bilateral;   toe nail removal Left 03/2018   TONSILLECTOMY  age 8   WISDOM TOOTH EXTRACTION  age 31   XI ROBOTIC ASSISTED HIATAL HERNIA REPAIR N/A 12/28/2021   Procedure: XI ROBOTIC ASSISTED HIATAL HERNIA REPAIR WITH MESH AND FUNDOPLICATION;  Surgeon: Ralene Ok, MD;  Location: [place];  Service: General;  Laterality: N/A;   Social History   Social History Narrative   Not on file   Immunization History  Administered Date(s) Administered   Fluad Quad(high Dose 65+) 08/12/2021   Influenza,inj,Quad PF,6+ Mos 07/23/2019   PFIZER(Purple Top)SARS-COV-2 Vaccination 12/19/2019, 01/13/2020, 09/20/2020   Tdap 01/31/2017     Objective: Vital Signs: BP 133/85 (BP Location: Left Arm, Patient Position: Sitting, Cuff Size: Normal)   Pulse 78   Resp 16   Ht 5' 7.5" (1.715 m)   Wt 146 lb (66.2 kg)   LMP 11/07/2009   BMI 22.53 kg/m    Physical Exam Vitals and nursing note reviewed.  Constitutional:      Appearance: She is well-developed.  HENT:     Head: Normocephalic and atraumatic.  Eyes:     Conjunctiva/sclera: Conjunctivae normal.  Cardiovascular:     Rate and Rhythm: Normal rate and regular rhythm.     Heart sounds: Normal heart sounds.  Pulmonary:     Effort: Pulmonary  effort is normal.     Breath sounds: Normal breath sounds.  Abdominal:     General: Bowel sounds are normal.     Palpations: Abdomen is soft.  Musculoskeletal:     Cervical back: Normal range of motion.  Lymphadenopathy:     Cervical: No cervical adenopathy.  Skin:    General: Skin is warm and dry.     Capillary Refill: Capillary refill takes less than 2 seconds.  Neurological:     Mental Status: She is alert and oriented to person, place, and time.  Psychiatric:        Behavior: Behavior normal.      Musculoskeletal Exam: Cervical spine was in good range of motion.  Scoliosis was noted in the lumbar region.  Shoulder joints, elbow joints, wrist joints with good range of motion.  She had marked thickening of the PIP and DIP joints.  Hip joints and knee joints were in good range of motion.  No warmth swelling or effusion was noted.  There was no tenderness over ankles or MTPs.  CDAI Exam: CDAI Score: -- Patient Global: --; Provider Global: -- Swollen: --; Tender: -- Joint Exam 08/04/2022   No joint exam has been documented for this visit   There is currently no information documented on the homunculus. Go to the Rheumatology activity and complete the homunculus joint exam.  Investigation: No additional findings.  Imaging: No results found.  Recent Labs: Lab Results  Component Value Date   WBC 6.4 12/22/2021   HGB 11.5 (L) 12/22/2021   PLT 347 12/22/2021   NA 137 12/29/2021   K 4.0 12/29/2021   CL 106 12/29/2021   CO2 22 12/29/2021   GLUCOSE 134 (H) 12/29/2021   BUN 8 12/29/2021   CREATININE 0.93 12/29/2021   BILITOT 0.4 05/22/2019   ALKPHOS 49 05/22/2019   AST 19 05/22/2019   ALT 11 05/22/2019   PROT 6.1 05/22/2019   ALBUMIN 4.1 05/22/2019   CALCIUM 8.7 (L) 12/29/2021   GFRAA 74 05/22/2019    Speciality Comments: No specialty comments available.  Procedures:  No procedures performed Allergies: Nsaids   Assessment / Plan:     Visit Diagnoses:  Fibromyalgia-she continues to have generalized pain and discomfort from fibromyalgia.  She came off Cymbalta in February.  She states since she stopped Cymbalta her thinking has become clear although her pain has increased.  As she has been doing water aerobics 3 times a week and also walking on a regular basis.  I will refer her to integrative therapies.  Stretching exercises were also emphasized.  She brought a list of supplements for my review today.  Primary osteoarthritis of both hands-she has mild osteoarthritis in her hands.  She continues to have some stiffness and discomfort in her hands.  Joint protection muscle strengthening was discussed.  Greater trochanteric bursitis of right hip-she has off-and-on discomfort in the right trochanteric region.  She had mild tenderness on the palpation.  I advised her to contact us if her symptoms get worse.  She had last cortisone injection in March 2023.  Primary osteoarthritis of both knees - s/p orthovisc bilateral knees 03/2022.  She had good response to Visco supplement injections.  She would like to have repeat viscosupplement injections.  We will schedule viscosupplement injections after November.  Primary osteoarthritis of both feet-she is to use to have some stiffness in her feet.  DDD (degenerative disc disease), lumbar-she is off and on discomfort in her lower back.  She had good mobility without any point tenderness today.  Other idiopathic scoliosis, lumbar region  Osteopenia of multiple sites - DEXA updated on 08/20/20:The BMD measured at Femur Neck Left is 0.855 g/cm2 with a T-score of -1.3. Due to update DEXA in October 2023.  Use of calcium, vitamin D and exercise was emphasized.  Other fatigue-related to fibromyalgia.  History of gastroesophageal reflux (GERD)  Hiatal hernia - Surgically repaired on 12/28/21.  Facial basal cell cancer-she had Mohs surgery on her nose in April 2023.  She had the skin graft after that and laser  treatment for discoloration.  Orders: No orders of the defined types were placed in this encounter.  No orders of the defined types were placed in this encounter.    Follow-Up Instructions: Return in about 6 months (around 02/02/2023) for Osteoarthritis.   Bo Merino, MD  Note - This record has been created using  Editor, commissioning.  Chart creation errors have been sought, but may not always  have been located. Such creation errors do not reflect on  the standard of medical care.

## 2022-08-04 ENCOUNTER — Encounter: Payer: Self-pay | Admitting: Rheumatology

## 2022-08-04 ENCOUNTER — Telehealth: Payer: Self-pay

## 2022-08-04 ENCOUNTER — Ambulatory Visit: Payer: Medicare Other | Attending: Rheumatology | Admitting: Rheumatology

## 2022-08-04 VITALS — BP 133/85 | HR 78 | Resp 16 | Ht 67.5 in | Wt 146.0 lb

## 2022-08-04 DIAGNOSIS — M4126 Other idiopathic scoliosis, lumbar region: Secondary | ICD-10-CM | POA: Diagnosis not present

## 2022-08-04 DIAGNOSIS — M19042 Primary osteoarthritis, left hand: Secondary | ICD-10-CM | POA: Insufficient documentation

## 2022-08-04 DIAGNOSIS — M5136 Other intervertebral disc degeneration, lumbar region: Secondary | ICD-10-CM | POA: Insufficient documentation

## 2022-08-04 DIAGNOSIS — M19041 Primary osteoarthritis, right hand: Secondary | ICD-10-CM | POA: Insufficient documentation

## 2022-08-04 DIAGNOSIS — K449 Diaphragmatic hernia without obstruction or gangrene: Secondary | ICD-10-CM | POA: Insufficient documentation

## 2022-08-04 DIAGNOSIS — M19071 Primary osteoarthritis, right ankle and foot: Secondary | ICD-10-CM | POA: Diagnosis not present

## 2022-08-04 DIAGNOSIS — M7061 Trochanteric bursitis, right hip: Secondary | ICD-10-CM | POA: Insufficient documentation

## 2022-08-04 DIAGNOSIS — M170 Bilateral primary osteoarthritis of knee: Secondary | ICD-10-CM | POA: Insufficient documentation

## 2022-08-04 DIAGNOSIS — M797 Fibromyalgia: Secondary | ICD-10-CM | POA: Insufficient documentation

## 2022-08-04 DIAGNOSIS — R5383 Other fatigue: Secondary | ICD-10-CM | POA: Diagnosis not present

## 2022-08-04 DIAGNOSIS — M19072 Primary osteoarthritis, left ankle and foot: Secondary | ICD-10-CM | POA: Insufficient documentation

## 2022-08-04 DIAGNOSIS — Z8719 Personal history of other diseases of the digestive system: Secondary | ICD-10-CM | POA: Insufficient documentation

## 2022-08-04 DIAGNOSIS — M8589 Other specified disorders of bone density and structure, multiple sites: Secondary | ICD-10-CM | POA: Diagnosis not present

## 2022-08-04 DIAGNOSIS — C44310 Basal cell carcinoma of skin of unspecified parts of face: Secondary | ICD-10-CM | POA: Diagnosis not present

## 2022-08-04 NOTE — Telephone Encounter (Signed)
Please apply for bilateral knee visco, per Dr. Deveshwar. Thanks!  

## 2022-08-04 NOTE — Telephone Encounter (Signed)
Patient eligible for reinjection Nov 16th.

## 2022-08-04 NOTE — Addendum Note (Signed)
Addended by: Earnestine Mealing on: 08/04/2022 01:44 PM   Modules accepted: Orders

## 2022-08-08 DIAGNOSIS — Z85828 Personal history of other malignant neoplasm of skin: Secondary | ICD-10-CM | POA: Diagnosis not present

## 2022-08-08 DIAGNOSIS — L819 Disorder of pigmentation, unspecified: Secondary | ICD-10-CM | POA: Diagnosis not present

## 2022-08-08 DIAGNOSIS — L814 Other melanin hyperpigmentation: Secondary | ICD-10-CM | POA: Diagnosis not present

## 2022-08-08 DIAGNOSIS — L821 Other seborrheic keratosis: Secondary | ICD-10-CM | POA: Diagnosis not present

## 2022-08-08 DIAGNOSIS — D2272 Melanocytic nevi of left lower limb, including hip: Secondary | ICD-10-CM | POA: Diagnosis not present

## 2022-08-15 ENCOUNTER — Encounter (HOSPITAL_BASED_OUTPATIENT_CLINIC_OR_DEPARTMENT_OTHER): Payer: Self-pay | Admitting: Obstetrics & Gynecology

## 2022-08-15 ENCOUNTER — Ambulatory Visit (INDEPENDENT_AMBULATORY_CARE_PROVIDER_SITE_OTHER): Payer: Medicare Other | Admitting: Obstetrics & Gynecology

## 2022-08-15 ENCOUNTER — Other Ambulatory Visit (HOSPITAL_COMMUNITY)
Admission: RE | Admit: 2022-08-15 | Discharge: 2022-08-15 | Disposition: A | Discharge status: Home / Self Care | Payer: Medicare Other | Source: Ambulatory Visit | Attending: Obstetrics & Gynecology | Admitting: Obstetrics & Gynecology

## 2022-08-15 VITALS — BP 143/82 | HR 83 | Ht 67.5 in | Wt 147.6 lb

## 2022-08-15 DIAGNOSIS — Z124 Encounter for screening for malignant neoplasm of cervix: Secondary | ICD-10-CM | POA: Diagnosis not present

## 2022-08-15 DIAGNOSIS — R3915 Urgency of urination: Secondary | ICD-10-CM | POA: Diagnosis not present

## 2022-08-15 DIAGNOSIS — Z01419 Encounter for gynecological examination (general) (routine) without abnormal findings: Secondary | ICD-10-CM | POA: Diagnosis not present

## 2022-08-15 DIAGNOSIS — Z9889 Other specified postprocedural states: Secondary | ICD-10-CM

## 2022-08-15 DIAGNOSIS — M797 Fibromyalgia: Secondary | ICD-10-CM | POA: Diagnosis not present

## 2022-08-15 DIAGNOSIS — Z23 Encounter for immunization: Secondary | ICD-10-CM

## 2022-08-15 DIAGNOSIS — M8589 Other specified disorders of bone density and structure, multiple sites: Secondary | ICD-10-CM | POA: Diagnosis not present

## 2022-08-15 MED ORDER — DULOXETINE HCL 30 MG PO CPEP: 30.0000 mg | ORAL_CAPSULE | Freq: Every day | ORAL | 1 refills | Status: DC

## 2022-08-15 NOTE — Progress Notes (Signed)
66 y.o. G71P2002 Married White or Caucasian female here for breast and pelvic exam.  I am also following her for osteopenia.  Denies vaginal bleeding.  Stopped cymbalta before her surgery.  Has noticed her joints really bother her.  Has started feeling hot again.    Would like names for new PCP.  Relays some concerns.  Discussed options.    Has surgical treatment for hiatal hernia earlier this year.  Did well.  Pleased with results.  Had treatment for basal cell on face.  Ended up with face graft.  Having laser treatment to smooth skin.   Patient's last menstrual period was 11/07/2009.          Sexually active: Yes.    H/O STD:  no  Health Maintenance: PCP:  Dr. Sharlett Iles.  Last wellness appt was 10/2021.  Did blood work at that appt:  yes Vaccines are up to date:  reviewed today Colonoscopy:  07/2019, tubular adenoma, follow up 5 years MMG:  06/20/2022 Negative BMD:  10/14/202021 Osteopenia Last pap smear:  05/21/2019 Negative.   H/o abnormal pap smear:  no    reports that she has never smoked. She has never been exposed to tobacco smoke. She has never used smokeless tobacco. She reports current alcohol use. She reports that she does not use drugs.  Past Medical History:  Diagnosis Date   Acid reflux    Arthritis    hands and neck   Back pain 12/01/1998   compression fracture L1 from sledding accident   Depression    Fibromyalgia    Hearing loss in right ear    Heart murmur    Hiatal hernia 11/2017   MVP (mitral valve prolapse)    Hx   SVD (spontaneous vaginal delivery)    x 2    Past Surgical History:  Procedure Laterality Date   BASAL CELL CARCINOMA EXCISION  12/2021   nose   BUNIONECTOMY Left 10/16/2014   Dr. 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   EYE SURGERY Bilateral 2022   cataract surgery L eye 09/23/21 R eye 10/07/21   INSERTION OF MESH N/A  12/28/2021   Procedure: INSERTION OF MESH;  Surgeon: Ralene Ok, MD;  Location: [facility];  Service: General;  Laterality: N/A;   LAPAROSCOPIC BILATERAL SALPINGO OOPHERECTOMY Bilateral 05/13/2014   Procedure: LAPAROSCOPIC BILATERAL SALPINGO OOPHORECTOMY;  Surgeon: Lyman Speller, MD;  Location: [facility];  Service: Gynecology;  Laterality: Bilateral;   toe nail removal Left 03/2018   TONSILLECTOMY  age 73   WISDOM TOOTH EXTRACTION  age 37   XI ROBOTIC ASSISTED HIATAL HERNIA REPAIR N/A 12/28/2021   Procedure: XI ROBOTIC ASSISTED HIATAL HERNIA REPAIR WITH MESH AND FUNDOPLICATION;  Surgeon: Ralene Ok, MD;  Location: [facility];  Service: General;  Laterality: N/A;    Current Outpatient Medications  Medication Sig Dispense Refill   acetaminophen (TYLENOL) 650 MG CR tablet 2 tablets as needed     Cholecalciferol (VITAMIN D3) 1.25 MG (50000 UT) TABS Take by mouth.     Glucos-Chondroit-Hyaluron-MSM (GLUCOSAMINE CHONDROITIN JOINT PO) Take by mouth daily.     MAGNESIUM MALATE PO Take 2 capsules by mouth daily.     OVER THE COUNTER MEDICATION Relief Factor: Fish oil '1400mg'$ , Epimedium(aerial) '200mg'$ , Turmeric extract '667mg'$  and Japanese Knotwood Extract '70mg'$      Polyethyl Glycol-Propyl Glycol (SYSTANE OP) Place 1 drop  into both eyes daily as needed (dry eyes).     TURMERIC PO Take 2 capsules by mouth daily.     No current facility-administered medications for this visit.    Family History  Problem Relation Age of Onset   Diabetes Mother    Hearing loss Mother    Cancer Father        GI   Heart disease Sister    Hearing loss Brother    Breast cancer Maternal Aunt    Ovarian cancer Other        MGM, mid 33's    Review of Systems  Constitutional: Negative.   Genitourinary: Negative.     Exam:   BP (!) 143/82 (BP Location: Left Arm, Patient Position: Sitting, Cuff Size: Large)   Pulse 83   Ht 5' 7.5" (1.715 m) Comment: Reported  Wt 147 lb 9.6 oz (67 kg)   LMP 11/07/2009   BMI  22.78 kg/m   Height: 5' 7.5" (171.5 cm) (Reported)  General appearance: alert, cooperative and appears stated age Breasts: normal appearance, no masses or tenderness Abdomen: soft, non-tender; bowel sounds normal; no masses,  no organomegaly Lymph nodes: Cervical, supraclavicular, and axillary nodes normal.  No abnormal inguinal nodes palpated Neurologic: Grossly normal  Pelvic: External genitalia:  no lesions              Urethra:  normal appearing urethra with no masses, tenderness or lesions              Bartholins and Skenes: normal                 Vagina: normal appearing vagina with atrophic changes and no discharge, no lesions              Cervix: no lesions              Pap taken: Yes.   Bimanual Exam:  Uterus:  normal size, contour, position, consistency, mobility, non-tender              Adnexa: normal adnexa and no mass, fullness, tenderness               Rectovaginal: Confirms               Anus:  normal sphincter tone, no lesions  Chaperone, Octaviano Batty, CMA, was present for exam.  Assessment/Plan: 1. Encntr for gyn exam (general) (routine) w/o abn findings - Pap smear obtained today - Mammogram 06/2022 - Colonoscopy 2020.  Follow up 4 years. - Bone mineral density 2021.   Worse T score -1.3 - lab work done done with PCP - vaccines reviewed/updated.  Influenza vaccine given today.  2. Cervical cancer screening - Cytology - PAP( [place]) - PR OBTAINING SCREEN PAP SMEAR  3. S/P Nissen fundoplication (without gastrostomy tube) procedure, h/o hiatal hernia - done 12/2021 with Dr. Rosendo Gros  4. Fibromyalgia  5. Osteopenia of multiple sites - plan to repeat 1-2 years  6.  Urinary urgency - urine culture - pelvic PT referral

## 2022-08-15 NOTE — Patient Instructions (Signed)
Gearhart Brassfield  Billie Ruddy, MD  Betty Gelvez Martinique, MD  Loleta Chance, MD  Carmelia Roller, NP

## 2022-08-17 ENCOUNTER — Ambulatory Visit (INDEPENDENT_AMBULATORY_CARE_PROVIDER_SITE_OTHER): Payer: Medicare Other

## 2022-08-17 DIAGNOSIS — N3090 Cystitis, unspecified without hematuria: Secondary | ICD-10-CM

## 2022-08-17 DIAGNOSIS — R350 Frequency of micturition: Secondary | ICD-10-CM

## 2022-08-17 LAB — POCT URINALYSIS DIPSTICK
Blood, UA: NEGATIVE
Glucose, UA: NEGATIVE
Leukocytes, UA: NEGATIVE
Nitrite, UA: NEGATIVE
Protein, UA: NEGATIVE
Spec Grav, UA: 1.02 (ref 1.010–1.025)
Urobilinogen, UA: 0.2 E.U./dL
pH, UA: 6 (ref 5.0–8.0)

## 2022-08-17 NOTE — Progress Notes (Signed)
Patient came back in today to give a sample to send off for urine culture. She was in the office on 08/15/2022 and forgot to give a sample. tbw

## 2022-08-18 LAB — CYTOLOGY - PAP: Diagnosis: NEGATIVE

## 2022-08-19 LAB — URINE CULTURE

## 2022-08-22 ENCOUNTER — Other Ambulatory Visit (HOSPITAL_BASED_OUTPATIENT_CLINIC_OR_DEPARTMENT_OTHER): Payer: Self-pay | Admitting: *Deleted

## 2022-08-22 MED ORDER — AMOXICILLIN 500 MG PO CAPS: 500.0000 mg | ORAL_CAPSULE | Freq: Three times a day (TID) | ORAL | 0 refills | Status: AC

## 2022-08-22 NOTE — Addendum Note (Signed)
Addended by: Megan Salon on: 08/22/2022 12:14 AM   Modules accepted: Orders

## 2022-08-22 NOTE — Telephone Encounter (Signed)
VOB submitted for Orthovisc, bilateral knees ?BV pending ? ?

## 2022-08-23 NOTE — Telephone Encounter (Signed)
LMOM for patient to call and schedule Orthovisc injections.

## 2022-08-23 NOTE — Telephone Encounter (Signed)
Please call to schedule visco injections.  Approved for Orthovisc, Bilateral knee(s). [place] Patient is covered at 100%, [employer] will pick up remaining expenses after Medicare pays No co-pay Deductibles does apply No pre-certifications

## 2022-08-31 DIAGNOSIS — L905 Scar conditions and fibrosis of skin: Secondary | ICD-10-CM | POA: Diagnosis not present

## 2022-09-05 ENCOUNTER — Ambulatory Visit (INDEPENDENT_AMBULATORY_CARE_PROVIDER_SITE_OTHER): Payer: Medicare Other

## 2022-09-05 DIAGNOSIS — R319 Hematuria, unspecified: Secondary | ICD-10-CM

## 2022-09-05 LAB — POCT URINALYSIS DIPSTICK
Glucose, UA: NEGATIVE
Ketones, UA: NEGATIVE
Leukocytes, UA: NEGATIVE
Nitrite, UA: NEGATIVE
Protein, UA: POSITIVE — AB
Spec Grav, UA: >=1.03 — AB (ref 1.010–1.025)
Urobilinogen, UA: 0.2 E.U./dL
pH, UA: 5.5 (ref 5.0–8.0)

## 2022-09-05 NOTE — Progress Notes (Signed)
Patient came in today to do a repeat urine. Patient has a complaint of urinary frequency. Urine has been sent off for urine culture. tbw

## 2022-09-07 DIAGNOSIS — M6281 Muscle weakness (generalized): Secondary | ICD-10-CM | POA: Diagnosis not present

## 2022-09-07 DIAGNOSIS — M797 Fibromyalgia: Secondary | ICD-10-CM | POA: Diagnosis not present

## 2022-09-07 DIAGNOSIS — M5459 Other low back pain: Secondary | ICD-10-CM | POA: Diagnosis not present

## 2022-09-07 LAB — URINE CULTURE

## 2022-10-04 ENCOUNTER — Other Ambulatory Visit: Payer: Self-pay

## 2022-10-04 ENCOUNTER — Ambulatory Visit: Payer: Medicare Other | Attending: Physician Assistant | Admitting: Physician Assistant

## 2022-10-04 DIAGNOSIS — M170 Bilateral primary osteoarthritis of knee: Secondary | ICD-10-CM | POA: Insufficient documentation

## 2022-10-04 MED ORDER — HYALURONAN 30 MG/2ML IX SOSY
30.0000 mg | PREFILLED_SYRINGE | INTRA_ARTICULAR | Status: AC | PRN
  Administered 2022-10-04: 30 mg via INTRA_ARTICULAR

## 2022-10-04 MED ORDER — LIDOCAINE HCL 1 % IJ SOLN
1.5000 mL | INTRAMUSCULAR | Status: AC | PRN
  Administered 2022-10-04: 1.5 mL

## 2022-10-04 MED ORDER — DULOXETINE HCL 30 MG PO CPEP: 30.0000 mg | ORAL_CAPSULE | Freq: Every day | ORAL | 0 refills | Status: DC

## 2022-10-04 NOTE — Telephone Encounter (Signed)
Please review and sign the pended cymbalta '30mg'$  daily refill that is pended per patient's request to you.   Thanks!

## 2022-10-04 NOTE — Progress Notes (Signed)
   Procedure Note  Patient: Courtney Waters             Date of Birth: 1956/04/12           MRN: [id number]             Visit Date: 10/04/2022  Procedures: Visit Diagnoses:  1. Primary osteoarthritis of both knees    Orthovisc #1 bilateral knees, B/B Large Joint Inj: bilateral knee on 10/04/2022 1:23 PM Indications: pain Details: 25 G 1.5 in needle, medial approach  Arthrogram: No  Medications (Right): 1.5 mL lidocaine 1 %; 30 mg Hyaluronan 30 MG/2ML Aspirate (Right): 0 mL Medications (Left): 1.5 mL lidocaine 1 %; 30 mg Hyaluronan 30 MG/2ML Aspirate (Left): 0 mL Outcome: tolerated well, no immediate complications Procedure, treatment alternatives, risks and benefits explained, specific risks discussed. Consent was given by the patient. Immediately prior to procedure a time out was called to verify the correct patient, procedure, equipment, support staff and site/side marked as required. Patient was prepped and draped in the usual sterile fashion.     Patient tolerated the procedure well.  Aftercare was discussed.   Patient requested a refill of Cymbalta 30 mg 1 capsule by mouth daily.  She has been tolerating Cymbalta without any side effects.  She requested a refill to be sent to [facility] today. Hazel Sams, PA-C

## 2022-10-10 ENCOUNTER — Telehealth: Payer: Self-pay

## 2022-10-10 NOTE — Telephone Encounter (Signed)
Called re: PREP classes in December at [facility], left voicemail

## 2022-10-11 ENCOUNTER — Ambulatory Visit: Payer: Medicare Other | Attending: Physician Assistant | Admitting: Physician Assistant

## 2022-10-11 DIAGNOSIS — M170 Bilateral primary osteoarthritis of knee: Secondary | ICD-10-CM | POA: Insufficient documentation

## 2022-10-11 MED ORDER — LIDOCAINE HCL 1 % IJ SOLN
1.5000 mL | INTRAMUSCULAR | Status: AC | PRN
  Administered 2022-10-11: 1.5 mL

## 2022-10-11 MED ORDER — HYALURONAN 30 MG/2ML IX SOSY
30.0000 mg | PREFILLED_SYRINGE | INTRA_ARTICULAR | Status: AC | PRN
  Administered 2022-10-11: 30 mg via INTRA_ARTICULAR

## 2022-10-11 NOTE — Progress Notes (Signed)
   Procedure Note  Patient: Courtney Waters             Date of Birth: 16-Oct-1956           MRN: [id number]             Visit Date: 10/11/2022  Procedures: Visit Diagnoses:  1. Primary osteoarthritis of both knees    Orthovisc #2 bilateral knees, B/B Large Joint Inj: bilateral knee on 10/11/2022 12:02 PM Indications: pain Details: 25 G 1.5 in needle, medial approach  Arthrogram: No  Medications (Right): 1.5 mL lidocaine 1 %; 30 mg Hyaluronan 30 MG/2ML Aspirate (Right): 0 mL Medications (Left): 1.5 mL lidocaine 1 %; 30 mg Hyaluronan 30 MG/2ML Aspirate (Left): 0 mL Outcome: tolerated well, no immediate complications Procedure, treatment alternatives, risks and benefits explained, specific risks discussed. Consent was given by the patient. Immediately prior to procedure a time out was called to verify the correct patient, procedure, equipment, support staff and site/side marked as required. Patient was prepped and draped in the usual sterile fashion.     Patient tolerated the procedures well.  Aftercare was discussed.  Hazel Sams, PA-C

## 2022-10-17 ENCOUNTER — Ambulatory Visit: Payer: Medicare Other | Admitting: Physician Assistant

## 2022-10-18 ENCOUNTER — Ambulatory Visit: Payer: Medicare Other | Admitting: Physician Assistant

## 2022-10-18 ENCOUNTER — Ambulatory Visit: Payer: Medicare Other | Attending: Physician Assistant | Admitting: Physician Assistant

## 2022-10-18 DIAGNOSIS — M170 Bilateral primary osteoarthritis of knee: Secondary | ICD-10-CM | POA: Diagnosis not present

## 2022-10-18 MED ORDER — HYALURONAN 30 MG/2ML IX SOSY
30.0000 mg | PREFILLED_SYRINGE | INTRA_ARTICULAR | Status: AC | PRN
  Administered 2022-10-18: 30 mg via INTRA_ARTICULAR

## 2022-10-18 MED ORDER — LIDOCAINE HCL 1 % IJ SOLN
1.5000 mL | INTRAMUSCULAR | Status: AC | PRN
  Administered 2022-10-18: 1.5 mL

## 2022-10-18 NOTE — Progress Notes (Signed)
   Procedure Note  Patient: Courtney Waters             Date of Birth: 1956-05-09           MRN: [id number]             Visit Date: 10/18/2022  Procedures: Visit Diagnoses:  1. Primary osteoarthritis of both knees    Orthovisc #3 bilateral knees, B/B Large Joint Inj: bilateral knee on 10/18/2022 12:05 PM Indications: pain Details: 25 G 1.5 in needle, medial approach  Arthrogram: No  Medications (Right): 1.5 mL lidocaine 1 %; 30 mg Hyaluronan 30 MG/2ML Aspirate (Right): 0 mL Medications (Left): 1.5 mL lidocaine 1 %; 30 mg Hyaluronan 30 MG/2ML Aspirate (Left): 0 mL Outcome: tolerated well, no immediate complications Procedure, treatment alternatives, risks and benefits explained, specific risks discussed. Consent was given by the patient. Immediately prior to procedure a time out was called to verify the correct patient, procedure, equipment, support staff and site/side marked as required. Patient was prepped and draped in the usual sterile fashion.

## 2022-11-10 ENCOUNTER — Telehealth: Payer: Self-pay

## 2022-11-10 NOTE — Telephone Encounter (Signed)
Called to discuss PREP program dates and times if still interested; left voicemail

## 2022-12-07 DIAGNOSIS — K219 Gastro-esophageal reflux disease without esophagitis: Secondary | ICD-10-CM | POA: Diagnosis not present

## 2022-12-07 DIAGNOSIS — R7989 Other specified abnormal findings of blood chemistry: Secondary | ICD-10-CM | POA: Diagnosis not present

## 2022-12-09 DIAGNOSIS — H43813 Vitreous degeneration, bilateral: Secondary | ICD-10-CM | POA: Diagnosis not present

## 2022-12-09 DIAGNOSIS — H26493 Other secondary cataract, bilateral: Secondary | ICD-10-CM | POA: Diagnosis not present

## 2022-12-09 DIAGNOSIS — H52203 Unspecified astigmatism, bilateral: Secondary | ICD-10-CM | POA: Diagnosis not present

## 2022-12-09 DIAGNOSIS — H04123 Dry eye syndrome of bilateral lacrimal glands: Secondary | ICD-10-CM | POA: Diagnosis not present

## 2022-12-09 DIAGNOSIS — H18513 Endothelial corneal dystrophy, bilateral: Secondary | ICD-10-CM | POA: Diagnosis not present

## 2022-12-09 DIAGNOSIS — H524 Presbyopia: Secondary | ICD-10-CM | POA: Diagnosis not present

## 2022-12-20 DIAGNOSIS — H26492 Other secondary cataract, left eye: Secondary | ICD-10-CM | POA: Diagnosis not present

## 2022-12-27 DIAGNOSIS — H26491 Other secondary cataract, right eye: Secondary | ICD-10-CM | POA: Diagnosis not present

## 2023-01-16 ENCOUNTER — Other Ambulatory Visit: Payer: Self-pay | Admitting: Rheumatology

## 2023-01-16 MED ORDER — DULOXETINE HCL 30 MG PO CPEP: 30.0000 mg | ORAL_CAPSULE | Freq: Every day | ORAL | 0 refills | Status: DC

## 2023-01-16 NOTE — Telephone Encounter (Signed)
Patient called the office requesting a refill of Cymbalta '30mg'$  to be sent to [facility] on [address].

## 2023-01-16 NOTE — Telephone Encounter (Signed)
Next Visit: 02/02/2023  Last Visit: 08/04/2022  Last Fill:10/04/2022  Dx: Fibromyalgia-   Current Dose per office note on 08/04/2022: She came off Cymbalta in February. She states since she stopped Cymbalta her thinking has become clear although her pain has increased.   Okay to refill Cymbalta?

## 2023-01-17 NOTE — Telephone Encounter (Signed)
Attempted to contact the patient and left message for patient to call the office.  

## 2023-01-19 NOTE — Progress Notes (Unsigned)
Office Visit Note  Patient: Courtney Waters             Date of Birth: 08-04-1956           MRN: [id number]             PCP: Donnajean Lopes, MD Referring: Donnajean Lopes, MD Visit Date: 02/02/2023 Occupation: @GUAROCC @  Subjective:  Generalized pain   History of Present Illness: Courtney Waters is a 67 y.o. female with history of fibromyalgia, DDD, and osteoarthritis.  Patient is taking Cymbalta 30 mg 1 capsule by mouth daily.  She continues to find Cymbalta to be effective and is tolerating this dose without any side effects.  She continues to go to water aerobics 3 days a week for exercise.  She is also been trying to walk on a regular basis for exercise.  Patient reports that she had minimal relief after undergoing Visco gel injections in November/December 2023.  She denies any warmth or swelling in her knee joints but has some stiffness and aching at times.  She is interested in proceeding with repeat Visco gel injections in the future but would like to try a different brand going forward.  She continues to have generalized myalgias and muscle tenderness due to fibromyalgia.  She feels that most of her discomfort has been due to underlying osteoarthritis especially in both CMC joints.  She has been taking turmeric on a daily basis. She is unsure if she has had an updated bone density and plans on checking with Dr. Sabra Heck her gynecologist.     Activities of Daily Living:  Patient reports morning stiffness for 2 hours.   Patient Denies nocturnal pain.  Difficulty dressing/grooming: Denies Difficulty climbing stairs: Denies Difficulty getting out of chair: Denies Difficulty using hands for taps, buttons, cutlery, and/or writing: Reports  Review of Systems  Constitutional:  Positive for fatigue.  HENT:  Positive for mouth dryness. Negative for mouth sores.   Eyes:  Positive for dryness.  Respiratory:  Negative for shortness of breath.   Cardiovascular:  Negative for  chest pain and palpitations.  Gastrointestinal:  Positive for constipation and diarrhea. Negative for blood in stool.  Endocrine: Negative for increased urination.  Genitourinary:  Negative for involuntary urination.  Musculoskeletal:  Positive for joint pain, joint pain, myalgias, muscle weakness, morning stiffness, muscle tenderness and myalgias. Negative for gait problem and joint swelling.  Skin:  Negative for color change, rash, hair loss and sensitivity to sunlight.  Allergic/Immunologic: Negative for susceptible to infections.  Neurological:  Negative for dizziness and headaches.  Hematological:  Negative for swollen glands.  Psychiatric/Behavioral:  Positive for sleep disturbance. Negative for depressed mood. The patient is not nervous/anxious.     PMFS History:  Patient Active Problem List   Diagnosis Date Noted   Basal cell carcinoma (BCC) of dorsum of nose 02/04/2022   S/P Nissen fundoplication (without gastrostomy tube) procedure 12/28/2021   Gastroesophageal reflux disease with hiatal hernia 11/12/2021   S/P BSO (bilateral salpingo-oophorectomy) 08/12/2021   Hiatal hernia 08/12/2021   Primary osteoarthritis of both knees 07/25/2017   Screening for osteoporosis 07/25/2017   DDD (degenerative disc disease), lumbar 07/25/2017   Primary osteoarthritis of both hands 01/26/2017   Primary osteoarthritis of both feet 01/26/2017   Other idiopathic scoliosis, lumbar region 01/26/2017   Fibromyalgia 02/05/2015   Postmenopausal 08/07/2014   METATARSALGIA 08/14/2007   BUNIONS, BILATERAL 08/14/2007   UNEQUAL LEG LENGTH, ACQUIRED 08/14/2007   HEEL PAIN, LEFT 08/08/2007  Osteopenia 08/08/2007    Past Medical History:  Diagnosis Date   Acid reflux    Arthritis    hands and neck   Back pain 12/01/1998   compression fracture L1 from sledding accident   Bladder infection    Depression    Fibromyalgia    Hearing loss in right ear    Heart murmur    Hiatal hernia 11/2017   MVP  (mitral valve prolapse)    Hx   SVD (spontaneous vaginal delivery)    x 2    Family History  Problem Relation Age of Onset   Diabetes Mother    Hearing loss Mother    Cancer Father        GI   Heart disease Sister    Hearing loss Brother    Breast cancer Maternal Aunt    Ovarian cancer Other        MGM, mid 1's   Past Surgical History:  Procedure Laterality Date   BASAL CELL CARCINOMA EXCISION  12/2021   nose   BUNIONECTOMY Left 10/16/2014   Dr. 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   EYE SURGERY Bilateral 2022   cataract surgery L eye 09/23/21 R eye 10/07/21   INSERTION OF MESH N/A 12/28/2021   Procedure: INSERTION OF MESH;  Surgeon: Ralene Ok, MD;  Location: [facility];  Service: General;  Laterality: N/A;   LAPAROSCOPIC BILATERAL SALPINGO OOPHERECTOMY Bilateral 05/13/2014   Procedure: LAPAROSCOPIC BILATERAL SALPINGO OOPHORECTOMY;  Surgeon: Lyman Speller, MD;  Location: [facility];  Service: Gynecology;  Laterality: Bilateral;   toe nail removal Left 03/2018   TONSILLECTOMY  age 11   WISDOM TOOTH EXTRACTION  age 39   XI ROBOTIC ASSISTED HIATAL HERNIA REPAIR N/A 12/28/2021   Procedure: XI ROBOTIC ASSISTED HIATAL HERNIA REPAIR WITH MESH AND FUNDOPLICATION;  Surgeon: Ralene Ok, MD;  Location: [facility];  Service: General;  Laterality: N/A;   Social History   Social History Narrative   Not on file   Immunization History  Administered Date(s) Administered   Fluad Quad(high Dose 65+) 08/12/2021, 08/15/2022   Influenza,inj,Quad PF,6+ Mos 07/23/2019   PFIZER(Purple Top)SARS-COV-2 Vaccination 12/19/2019, 01/13/2020, 09/20/2020   Tdap 01/31/2017     Objective: Vital Signs: BP 122/76 (BP Location: Left Arm, Patient Position: Sitting, Cuff Size: Normal)   Pulse 80   Resp 14   Ht 5' 7.25" (1.708 m)   Wt 151 lb (68.5 kg)   LMP 11/07/2009   BMI 23.47 kg/m     Physical Exam Vitals and nursing note reviewed.  Constitutional:      Appearance: She is well-developed.  HENT:     Head: Normocephalic and atraumatic.  Eyes:     Conjunctiva/sclera: Conjunctivae normal.  Cardiovascular:     Rate and Rhythm: Normal rate and regular rhythm.     Heart sounds: Normal heart sounds.  Pulmonary:     Effort: Pulmonary effort is normal.     Breath sounds: Normal breath sounds.  Abdominal:     General: Bowel sounds are normal.     Palpations: Abdomen is soft.  Musculoskeletal:     Cervical back: Normal range of motion.  Skin:    General: Skin is warm and dry.     Capillary Refill: Capillary refill takes less than 2 seconds.  Neurological:     Mental Status: She is alert and  oriented to person, place, and time.  Psychiatric:        Behavior: Behavior normal.      Musculoskeletal Exam: C-spine has good range of motion with no discomfort.  Some trapezius muscle tension and tenderness bilaterally.  Scoliosis of the lumbar spine noted.  Shoulder joints, elbow joints, and wrist joints have good range of motion.  Thickening of bilateral CMC joints with tenderness bilaterally.  PIP and DIP thickening consistent with osteoarthritis of both hands.  Hip joints have good range of motion with no groin pain.  Some tenderness over the trochanteric bursa on the right side.  Knee joints have good range of motion with some crepitus but no warmth or effusion noted.  Ankle joints have good range of motion with no tenderness or joint swelling.  CDAI Exam: CDAI Score: -- Patient Global: --; Provider Global: -- Swollen: --; Tender: -- Joint Exam 02/02/2023   No joint exam has been documented for this visit   There is currently no information documented on the homunculus. Go to the Rheumatology activity and complete the homunculus joint exam.  Investigation: No additional findings.  Imaging: No results found.  Recent Labs: Lab Results  Component Value Date   WBC  6.4 12/22/2021   HGB 11.5 (L) 12/22/2021   PLT 347 12/22/2021   NA 137 12/29/2021   K 4.0 12/29/2021   CL 106 12/29/2021   CO2 22 12/29/2021   GLUCOSE 134 (H) 12/29/2021   BUN 8 12/29/2021   CREATININE 0.93 12/29/2021   BILITOT 0.4 05/22/2019   ALKPHOS 49 05/22/2019   AST 19 05/22/2019   ALT 11 05/22/2019   PROT 6.1 05/22/2019   ALBUMIN 4.1 05/22/2019   CALCIUM 8.7 (L) 12/29/2021   GFRAA 74 05/22/2019    Speciality Comments: No specialty comments available.  Procedures:  No procedures performed Allergies: Nsaids  Water aerobics 3 days a week  Cymbalta 30 mg daily  Visco November/December 2023, minimal relief  Overdue for DEXA  Assessment / Plan:     Visit Diagnoses: Fibromyalgia - She experiences intermittent myalgias and muscle tenderness due to fibromyalgia.  Overall her symptoms have been tolerable.  She remains on Cymbalta 30 mg 1 capsule by mouth daily.  She is tolerating the current dose of Cymbalta without any side effects and has found it to be effective at helping to manage her symptoms.  Overall her energy level has been stable.  She has been going to water aerobics class III days a week and has been walking on a regular basis for exercise.  Discussed the importance of regular exercise and good sleep hygiene.  She will follow-up in the office in 6 months or sooner if needed.  Greater trochanteric bursitis of right hip: She has tenderness palpation over the right trochanteric bursa.  Discussed the importance of performing stretching exercises daily.  She has been performing range of motion exercises in the pool 3 days a week which have been helpful.  She is advised to notify us if her symptoms persist or worsen or if she would like to return for a right trochanteric bursa cortisone injection in the future.  Other fatigue: Chronic, stable.  Discussed the importance of regular exercise and good sleep hygiene.  Primary osteoarthritis of both hands: CMC joint prominence and  thickening bilaterally.  She has PIP and DIP thickening consistent with osteoarthritis of both hands.  No tenderness or synovitis over MCP joints noted.  Complete fist formation bilaterally.  Discussed the importance of joint  protection and muscle strengthening.  She was encouraged to perform hand exercises daily.  She was given a list of natural anti-inflammatories to start taking.  Primary osteoarthritis of both knees - - s/p orthovisc bilateral knees 03/2022  Primary osteoarthritis of both feet: She is not currently experiencing any increased discomfort in her feet.  She has good range of motion of both ankle joints with no tenderness or synovitis.  No evidence of Achilles tendinitis.  She is wearing proper fitting shoes.  DDD (degenerative disc disease), lumbar: She experiences intermittent discomfort and stiffness in her lower back.  Encourage patient to continue to go to water aerobics.  Other idiopathic scoliosis, lumbar region  Osteopenia of multiple sites - - DEXA updated on 08/20/20:The BMD measured at Femur Neck Left is 0.855 g/cm2 with a T-score of -1.3. Due to update DEXA in October 2023--patient is unsure if she has had an updated bone density and plans to check with her gynecologist Dr. Sabra Heck. Discussed the importance of calcium and vitamin D supplementation as well as performing resistive exercises.  Plan to review DEXA results once updated.  Other medical conditions are listed as follows:  History of gastroesophageal reflux (GERD)  Hiatal hernia  Facial basal cell cancer  Orders: No orders of the defined types were placed in this encounter.  No orders of the defined types were placed in this encounter.     Follow-Up Instructions: Return in about 6 months (around 08/05/2023) for Fibromyalgia.   Ofilia Neas, PA-C  Note - This record has been created using Dragon software.  Chart creation errors have been sought, but may not always  have been located. Such creation  errors do not reflect on  the standard of medical care.

## 2023-01-24 DIAGNOSIS — M797 Fibromyalgia: Secondary | ICD-10-CM | POA: Diagnosis not present

## 2023-01-24 DIAGNOSIS — Z1339 Encounter for screening examination for other mental health and behavioral disorders: Secondary | ICD-10-CM | POA: Diagnosis not present

## 2023-01-24 DIAGNOSIS — R5382 Chronic fatigue, unspecified: Secondary | ICD-10-CM | POA: Diagnosis not present

## 2023-01-24 DIAGNOSIS — Z0000 Encounter for general adult medical examination without abnormal findings: Secondary | ICD-10-CM | POA: Diagnosis not present

## 2023-01-24 DIAGNOSIS — M5136 Other intervertebral disc degeneration, lumbar region: Secondary | ICD-10-CM | POA: Diagnosis not present

## 2023-01-24 DIAGNOSIS — K219 Gastro-esophageal reflux disease without esophagitis: Secondary | ICD-10-CM | POA: Diagnosis not present

## 2023-01-24 DIAGNOSIS — D649 Anemia, unspecified: Secondary | ICD-10-CM | POA: Diagnosis not present

## 2023-01-24 DIAGNOSIS — R82998 Other abnormal findings in urine: Secondary | ICD-10-CM | POA: Diagnosis not present

## 2023-01-24 DIAGNOSIS — Z1331 Encounter for screening for depression: Secondary | ICD-10-CM | POA: Diagnosis not present

## 2023-02-02 ENCOUNTER — Encounter: Payer: Self-pay | Admitting: Physician Assistant

## 2023-02-02 ENCOUNTER — Ambulatory Visit: Payer: Medicare Other | Attending: Physician Assistant | Admitting: Physician Assistant

## 2023-02-02 VITALS — BP 122/76 | HR 80 | Resp 14 | Ht 67.25 in | Wt 151.0 lb

## 2023-02-02 DIAGNOSIS — Z8719 Personal history of other diseases of the digestive system: Secondary | ICD-10-CM | POA: Diagnosis not present

## 2023-02-02 DIAGNOSIS — R5383 Other fatigue: Secondary | ICD-10-CM | POA: Diagnosis not present

## 2023-02-02 DIAGNOSIS — M19072 Primary osteoarthritis, left ankle and foot: Secondary | ICD-10-CM | POA: Insufficient documentation

## 2023-02-02 DIAGNOSIS — M170 Bilateral primary osteoarthritis of knee: Secondary | ICD-10-CM | POA: Diagnosis not present

## 2023-02-02 DIAGNOSIS — M19042 Primary osteoarthritis, left hand: Secondary | ICD-10-CM | POA: Diagnosis not present

## 2023-02-02 DIAGNOSIS — C44310 Basal cell carcinoma of skin of unspecified parts of face: Secondary | ICD-10-CM | POA: Insufficient documentation

## 2023-02-02 DIAGNOSIS — M19041 Primary osteoarthritis, right hand: Secondary | ICD-10-CM | POA: Insufficient documentation

## 2023-02-02 DIAGNOSIS — K449 Diaphragmatic hernia without obstruction or gangrene: Secondary | ICD-10-CM | POA: Diagnosis not present

## 2023-02-02 DIAGNOSIS — M7061 Trochanteric bursitis, right hip: Secondary | ICD-10-CM | POA: Diagnosis not present

## 2023-02-02 DIAGNOSIS — M19071 Primary osteoarthritis, right ankle and foot: Secondary | ICD-10-CM | POA: Insufficient documentation

## 2023-02-02 DIAGNOSIS — M8589 Other specified disorders of bone density and structure, multiple sites: Secondary | ICD-10-CM | POA: Diagnosis not present

## 2023-02-02 DIAGNOSIS — M5136 Other intervertebral disc degeneration, lumbar region: Secondary | ICD-10-CM | POA: Insufficient documentation

## 2023-02-02 DIAGNOSIS — M797 Fibromyalgia: Secondary | ICD-10-CM | POA: Insufficient documentation

## 2023-02-02 DIAGNOSIS — M4126 Other idiopathic scoliosis, lumbar region: Secondary | ICD-10-CM | POA: Diagnosis not present

## 2023-04-17 ENCOUNTER — Other Ambulatory Visit: Payer: Self-pay

## 2023-04-17 MED ORDER — DULOXETINE HCL 30 MG PO CPEP: 30.0000 mg | ORAL_CAPSULE | Freq: Every day | ORAL | 0 refills | Status: DC

## 2023-04-17 NOTE — Telephone Encounter (Signed)
Patient called requesting refill on CYMBALTA 30MG  for 90 day be sent to [facility] [address] [place], [place] - [address]. Any question best contact number is [phone]

## 2023-04-17 NOTE — Addendum Note (Signed)
Addended by: Henriette Combs on: 04/17/2023 11:16 AM   Modules accepted: Orders

## 2023-04-17 NOTE — Telephone Encounter (Signed)
Last Fill: 01/16/2023  Next Visit: 08/08/2023  Last Visit: 02/02/2023  Dx: Fibromyalgia   Current Dose per office note on 02/02/2023: Cymbalta 30 mg 1 capsule by mouth daily.   Okay to refill Cymbalta?

## 2023-07-07 ENCOUNTER — Other Ambulatory Visit: Payer: Self-pay | Admitting: Internal Medicine

## 2023-07-07 DIAGNOSIS — Z1231 Encounter for screening mammogram for malignant neoplasm of breast: Secondary | ICD-10-CM

## 2023-07-14 ENCOUNTER — Other Ambulatory Visit: Payer: Self-pay | Admitting: Rheumatology

## 2023-07-14 NOTE — Telephone Encounter (Signed)
Last Fill: 04/17/2023  Next Visit: 08/08/2023  Last Visit: 02/02/2023  DX: Fibromyalgia   Current Dose per office note on 02/02/2023: Cymbalta 30 mg 1 capsule by mouth daily.   Okay to refill cymbalta?

## 2023-07-17 ENCOUNTER — Telehealth: Payer: Self-pay | Admitting: Rheumatology

## 2023-07-17 NOTE — Telephone Encounter (Signed)
Prescription sent to the pharmacy 07/17/2023

## 2023-07-17 NOTE — Telephone Encounter (Signed)
Patient contacted the office to request a medication refill.   1. Name of Medication: Cymbalta - 3 month supply  2. How are you currently taking this medication (dosage and times per day)? 30 mg 1 time/day   3. What pharmacy would you like for that to be sent to? [facility] [address] [facility].

## 2023-07-27 ENCOUNTER — Ambulatory Visit
Admission: RE | Admit: 2023-07-27 | Discharge: 2023-07-27 | Disposition: A | Discharge status: Home / Self Care | Payer: Medicare Other | Source: Ambulatory Visit | Attending: Internal Medicine | Admitting: Internal Medicine

## 2023-07-27 DIAGNOSIS — Z1231 Encounter for screening mammogram for malignant neoplasm of breast: Secondary | ICD-10-CM | POA: Diagnosis not present

## 2023-08-08 ENCOUNTER — Encounter: Payer: Self-pay | Admitting: Rheumatology

## 2023-08-08 ENCOUNTER — Ambulatory Visit: Payer: Medicare Other | Attending: Rheumatology | Admitting: Rheumatology

## 2023-08-08 VITALS — BP 126/78 | HR 78 | Resp 15 | Ht 67.5 in | Wt 151.0 lb

## 2023-08-08 DIAGNOSIS — M19071 Primary osteoarthritis, right ankle and foot: Secondary | ICD-10-CM | POA: Diagnosis not present

## 2023-08-08 DIAGNOSIS — M797 Fibromyalgia: Secondary | ICD-10-CM | POA: Diagnosis not present

## 2023-08-08 DIAGNOSIS — Z8719 Personal history of other diseases of the digestive system: Secondary | ICD-10-CM | POA: Insufficient documentation

## 2023-08-08 DIAGNOSIS — M19041 Primary osteoarthritis, right hand: Secondary | ICD-10-CM | POA: Diagnosis not present

## 2023-08-08 DIAGNOSIS — R5383 Other fatigue: Secondary | ICD-10-CM | POA: Insufficient documentation

## 2023-08-08 DIAGNOSIS — C44310 Basal cell carcinoma of skin of unspecified parts of face: Secondary | ICD-10-CM | POA: Diagnosis not present

## 2023-08-08 DIAGNOSIS — M51369 Other intervertebral disc degeneration, lumbar region without mention of lumbar back pain or lower extremity pain: Secondary | ICD-10-CM | POA: Diagnosis not present

## 2023-08-08 DIAGNOSIS — M170 Bilateral primary osteoarthritis of knee: Secondary | ICD-10-CM | POA: Diagnosis not present

## 2023-08-08 DIAGNOSIS — M180 Bilateral primary osteoarthritis of first carpometacarpal joints: Secondary | ICD-10-CM

## 2023-08-08 DIAGNOSIS — K449 Diaphragmatic hernia without obstruction or gangrene: Secondary | ICD-10-CM | POA: Insufficient documentation

## 2023-08-08 DIAGNOSIS — M2241 Chondromalacia patellae, right knee: Secondary | ICD-10-CM | POA: Diagnosis not present

## 2023-08-08 DIAGNOSIS — M5136 Other intervertebral disc degeneration, lumbar region: Secondary | ICD-10-CM

## 2023-08-08 DIAGNOSIS — M5126 Other intervertebral disc displacement, lumbar region: Secondary | ICD-10-CM | POA: Diagnosis not present

## 2023-08-08 DIAGNOSIS — M19042 Primary osteoarthritis, left hand: Secondary | ICD-10-CM | POA: Diagnosis not present

## 2023-08-08 DIAGNOSIS — M7061 Trochanteric bursitis, right hip: Secondary | ICD-10-CM | POA: Insufficient documentation

## 2023-08-08 DIAGNOSIS — M2242 Chondromalacia patellae, left knee: Secondary | ICD-10-CM | POA: Diagnosis not present

## 2023-08-08 DIAGNOSIS — M8589 Other specified disorders of bone density and structure, multiple sites: Secondary | ICD-10-CM | POA: Diagnosis not present

## 2023-08-08 DIAGNOSIS — M19072 Primary osteoarthritis, left ankle and foot: Secondary | ICD-10-CM | POA: Insufficient documentation

## 2023-08-08 DIAGNOSIS — M4126 Other idiopathic scoliosis, lumbar region: Secondary | ICD-10-CM | POA: Diagnosis not present

## 2023-08-08 NOTE — Patient Instructions (Signed)
Hand Exercises Hand exercises can be helpful for almost anyone. They can strengthen your hands and improve flexibility and movement. The exercises can also increase blood flow to the hands. These results can make your work and daily tasks easier for you. Hand exercises can be especially helpful for people who have joint pain from arthritis or nerve damage from using their hands over and over. These exercises can also help people who injure a hand. Exercises Most of these hand exercises are gentle stretching and motion exercises. It is usually safe to do them often throughout the day. Warming up your hands before exercise may help reduce stiffness. You can do this with gentle massage or by placing your hands in warm water for 10-15 minutes. It is normal to feel some stretching, pulling, tightness, or mild discomfort when you begin new exercises. In time, this will improve. Remember to always be careful and stop right away if you feel sudden, very bad pain or your pain gets worse. You want to get better and be safe. Ask your health care provider which exercises are safe for you. Do exercises exactly as told by your provider and adjust them as told. Do not begin these exercises until told by your provider. Knuckle bend or "claw" fist  Stand or sit with your arm, hand, and all five fingers pointed straight up. Make sure to keep your wrist straight. Gently bend your fingers down toward your palm until the tips of your fingers are touching your palm. Keep your big knuckle straight and only bend the small knuckles in your fingers. Hold this position for 10 seconds. Straighten your fingers back to your starting position. Repeat this exercise 5-10 times with each hand. Full finger fist  Stand or sit with your arm, hand, and all five fingers pointed straight up. Make sure to keep your wrist straight. Gently bend your fingers into your palm until the tips of your fingers are touching the middle of your  palm. Hold this position for 10 seconds. Extend your fingers back to your starting position, stretching every joint fully. Repeat this exercise 5-10 times with each hand. Straight fist  Stand or sit with your arm, hand, and all five fingers pointed straight up. Make sure to keep your wrist straight. Gently bend your fingers at the big knuckle, where your fingers meet your hand, and at the middle knuckle. Keep the knuckle at the tips of your fingers straight and try to touch the bottom of your palm. Hold this position for 10 seconds. Extend your fingers back to your starting position, stretching every joint fully. Repeat this exercise 5-10 times with each hand. Tabletop  Stand or sit with your arm, hand, and all five fingers pointed straight up. Make sure to keep your wrist straight. Gently bend your fingers at the big knuckle, where your fingers meet your hand, as far down as you can. Keep the small knuckles in your fingers straight. Think of forming a tabletop with your fingers. Hold this position for 10 seconds. Extend your fingers back to your starting position, stretching every joint fully. Repeat this exercise 5-10 times with each hand. Finger spread  Place your hand flat on a table with your palm facing down. Make sure your wrist stays straight. Spread your fingers and thumb apart from each other as far as you can until you feel a gentle stretch. Hold this position for 10 seconds. Bring your fingers and thumb tight together again. Hold this position for 10 seconds. Repeat  this exercise 5-10 times with each hand. Making circles  Stand or sit with your arm, hand, and all five fingers pointed straight up. Make sure to keep your wrist straight. Make a circle by touching the tip of your thumb to the tip of your index finger. Hold for 10 seconds. Then open your hand wide. Repeat this motion with your thumb and each of your fingers. Repeat this exercise 5-10 times with each hand. Thumb  motion  Sit with your forearm resting on a table and your wrist straight. Your thumb should be facing up toward the ceiling. Keep your fingers relaxed as you move your thumb. Lift your thumb up as high as you can toward the ceiling. Hold for 10 seconds. Bend your thumb across your palm as far as you can, reaching the tip of your thumb for the small finger (pinkie) side of your palm. Hold for 10 seconds. Repeat this exercise 5-10 times with each hand. Grip strengthening  Hold a stress ball or other soft ball in the middle of your hand. Slowly increase the pressure, squeezing the ball as much as you can without causing pain. Think of bringing the tips of your fingers into the middle of your palm. All of your finger joints should bend when doing this exercise. Hold your squeeze for 10 seconds, then relax. Repeat this exercise 5-10 times with each hand. Contact a health care provider if: Your hand pain or discomfort gets much worse when you do an exercise. Your hand pain or discomfort does not improve within 2 hours after you exercise. If you have either of these problems, stop doing these exercises right away. Do not do them again unless your provider says that you can. Get help right away if: You develop sudden, severe hand pain or swelling. If this happens, stop doing these exercises right away. Do not do them again unless your provider says that you can. This information is not intended to replace advice given to you by your health care provider. Make sure you discuss any questions you have with your health care provider. Document Revised: 11/08/2022 Document Reviewed: 11/08/2022 [employer] Patient Education  2024 [employer] Exercises for Chronic Knee Pain Chronic knee pain is pain that lasts longer than 3 months. For most people with chronic knee pain, exercise and weight loss is an important part of treatment. Your health care provider may want you to focus on: Making the muscles that  support your knee stronger. This can take pressure off your knee and reduce pain. Preventing knee stiffness. How far you can move your knee, keeping it there or making it farther. Losing weight (if this applies) to take pressure off your knee, lower your risk for injury, and make it easier for you to exercise. Your provider will help you make an exercise program that fits your needs and physical abilities. Below are simple, low-impact exercises you can do at home. Ask your provider or physical therapist how often you should do your exercise program and how many times to repeat each exercise. General safety tips  Get your provider's approval before doing any exercises. Start slowly and stop any time you feel pain. Do not exercise if your knee pain is flaring up. Warm up first. Stretching a cold muscle can cause an injury. Do 5-10 minutes of easy movement or light stretching before beginning your exercises. Do 5-10 minutes of low-impact activity (like walking or cycling) before starting strengthening exercises. Contact your provider any time you have pain during  or after exercising. Exercise can cause discomfort but should not be painful. It is normal to be a little stiff or sore after exercising. Stretching and range-of-motion exercises Front thigh stretch  Stand up straight and support your body by holding on to a chair or resting one hand on a wall. With your legs straight and close together, bend one knee to lift your heel up toward your butt. Using one hand for support, grab your ankle with your free hand. Pull your foot up closer toward your butt to feel the stretch in front of your thigh. Hold the stretch for 30 seconds. Repeat __________ times. Complete this exercise __________ times a day. Back thigh stretch  Sit on the floor with your back straight and your legs out straight in front of you. Place the palms of your hands on the floor and slide them toward your feet as you bend at the  hip. Try to touch your nose to your knees and feel the stretch in the back of your thighs. Hold for 30 seconds. Repeat __________ times. Complete this exercise __________ times a day. Calf stretch  Stand facing a wall. Place the palms of your hands flat against the wall, arms extended, and lean slightly against the wall. Get into a lunge position with one leg bent at the knee and the other leg stretched out straight behind you. Keep both feet facing the wall and increase the bend in your knee while keeping the heel of the other leg flat on the ground. You should feel the stretch in your calf. Hold for 30 seconds. Repeat __________ times. Complete this exercise __________ times a day. Strengthening exercises Straight leg lift  Lie on your back with one knee bent and the other leg out straight. Slowly lift the straight leg without bending the knee. Lift until your foot is about 12 inches (30 cm) off the floor. Hold for 3-5 seconds and slowly lower your leg. Repeat __________ times. Complete this exercise __________ times a day. Single leg dip  Stand between two chairs and put both hands on the backs of the chairs for support. Extend one leg out straight with your body weight resting on the heel of the standing leg. Slowly bend your standing knee to dip your body to the level that is comfortable for you. Hold for 3-5 seconds. Repeat __________ times. Complete this exercise __________ times a day. Hamstring curls  Stand straight, knees close together, facing the back of a chair. Hold on to the back of a chair with both hands. Keep one leg straight. Bend the other knee while bringing the heel up toward the butt until the knee is bent at a 90-degree angle (right angle). Hold for 3-5 seconds. Repeat __________ times. Complete this exercise __________ times a day. Wall squat  Stand straight with your back, hips, and head against a wall. Step forward one foot at a time with your back  still against the wall. Your feet should be 2 feet (61 cm) from the wall at shoulder width. Keeping your back, hips, and head against the wall, slide down the wall to as close to a sitting position as you can get. Hold for 5-10 seconds, then slowly slide back up. Repeat __________ times. Complete this exercise __________ times a day. Step-ups  Stand in front of a sturdy platform or stool that is about 6 inches (15 cm) high. Slowly step up with your left / right foot, keeping your knee in line with your hip  and foot. Do not let your knee bend so far that you cannot see your toes. Hold on to a chair for balance, but do not use it for support. Slowly unlock your knee and lower yourself to the starting position. Repeat __________ times. Complete this exercise __________ times a day. Contact a health care provider if: Your exercises cause pain. Your pain is worse after you exercise. Your pain prevents you from doing your exercises. This information is not intended to replace advice given to you by your health care provider. Make sure you discuss any questions you have with your health care provider. Document Revised: 11/08/2022 Document Reviewed: 11/08/2022 [employer] Patient Education  2024 [employer] Low Back Sprain or Strain Rehab Ask your health care provider which exercises are safe for you. Do exercises exactly as told by your health care provider and adjust them as directed. It is normal to feel mild stretching, pulling, tightness, or discomfort as you do these exercises. Stop right away if you feel sudden pain or your pain gets worse. Do not begin these exercises until told by your health care provider. Stretching and range-of-motion exercises These exercises warm up your muscles and joints and improve the movement and flexibility of your back. These exercises also help to relieve pain, numbness, and tingling. Lumbar rotation  Lie on your back on a firm bed or the floor with your knees  bent. Straighten your arms out to your sides so each arm forms a 90-degree angle (right angle) with a side of your body. Slowly move (rotate) both of your knees to one side of your body until you feel a stretch in your lower back (lumbar). Try not to let your shoulders lift off the floor. Hold this position for __________ seconds. Tense your abdominal muscles and slowly move your knees back to the starting position. Repeat this exercise on the other side of your body. Repeat __________ times. Complete this exercise __________ times a day. Single knee to chest  Lie on your back on a firm bed or the floor with both legs straight. Bend one of your knees. Use your hands to move your knee up toward your chest until you feel a gentle stretch in your lower back and buttock. Hold your leg in this position by holding on to the front of your knee. Keep your other leg as straight as possible. Hold this position for __________ seconds. Slowly return to the starting position. Repeat with your other leg. Repeat __________ times. Complete this exercise __________ times a day. Prone extension on elbows  Lie on your abdomen on a firm bed or the floor (prone position). Prop yourself up on your elbows. Use your arms to help lift your chest up until you feel a gentle stretch in your abdomen and your lower back. This will place some of your body weight on your elbows. If this is uncomfortable, try stacking pillows under your chest. Your hips should stay down, against the surface that you are lying on. Keep your hip and back muscles relaxed. Hold this position for __________ seconds. Slowly relax your upper body and return to the starting position. Repeat __________ times. Complete this exercise __________ times a day. Strengthening exercises These exercises build strength and endurance in your back. Endurance is the ability to use your muscles for a long time, even after they get tired. Pelvic tilt This  exercise strengthens the muscles that lie deep in the abdomen. Lie on your back on a firm bed or the  floor with your legs extended. Bend your knees so they are pointing toward the ceiling and your feet are flat on the floor. Tighten your lower abdominal muscles to press your lower back against the floor. This motion will tilt your pelvis so your tailbone points up toward the ceiling instead of pointing to your feet or the floor. To help with this exercise, you may place a small towel under your lower back and try to push your back into the towel. Hold this position for __________ seconds. Let your muscles relax completely before you repeat this exercise. Repeat __________ times. Complete this exercise __________ times a day. Alternating arm and leg raises  Get on your hands and knees on a firm surface. If you are on a hard floor, you may want to use padding, such as an exercise mat, to cushion your knees. Line up your arms and legs. Your hands should be directly below your shoulders, and your knees should be directly below your hips. Lift your left leg behind you. At the same time, raise your right arm and straighten it in front of you. Do not lift your leg higher than your hip. Do not lift your arm higher than your shoulder. Keep your abdominal and back muscles tight. Keep your hips facing the ground. Do not arch your back. Keep your balance carefully, and do not hold your breath. Hold this position for __________ seconds. Slowly return to the starting position. Repeat with your right leg and your left arm. Repeat __________ times. Complete this exercise __________ times a day. Abdominal set with straight leg raise  Lie on your back on a firm bed or the floor. Bend one of your knees and keep your other leg straight. Tense your abdominal muscles and lift your straight leg up, 4-6 inches (10-15 cm) off the ground. Keep your abdominal muscles tight and hold this position for __________  seconds. Do not hold your breath. Do not arch your back. Keep it flat against the ground. Keep your abdominal muscles tense as you slowly lower your leg back to the starting position. Repeat with your other leg. Repeat __________ times. Complete this exercise __________ times a day. Single leg lower with bent knees Lie on your back on a firm bed or the floor. Tense your abdominal muscles and lift your feet off the floor, one foot at a time, so your knees and hips are bent in 90-degree angles (right angles). Your knees should be over your hips and your lower legs should be parallel to the floor. Keeping your abdominal muscles tense and your knee bent, slowly lower one of your legs so your toe touches the ground. Lift your leg back up to return to the starting position. Do not hold your breath. Do not let your back arch. Keep your back flat against the ground. Repeat with your other leg. Repeat __________ times. Complete this exercise __________ times a day. Posture and body mechanics Good posture and healthy body mechanics can help to relieve stress in your body's tissues and joints. Body mechanics refers to the movements and positions of your body while you do your daily activities. Posture is part of body mechanics. Good posture means: Your spine is in its natural S-curve position (neutral). Your shoulders are pulled back slightly. Your head is not tipped forward (neutral). Follow these guidelines to improve your posture and body mechanics in your everyday activities. Standing  When standing, keep your spine neutral and your feet about hip-width apart. Keep a  slight bend in your knees. Your ears, shoulders, and hips should line up. When you do a task in which you stand in one place for a long time, place one foot up on a stable object that is 2-4 inches (5-10 cm) high, such as a footstool. This helps keep your spine neutral. Sitting  When sitting, keep your spine neutral and keep your  feet flat on the floor. Use a footrest, if necessary, and keep your thighs parallel to the floor. Avoid rounding your shoulders, and avoid tilting your head forward. When working at a desk or a computer, keep your desk at a height where your hands are slightly lower than your elbows. Slide your chair under your desk so you are close enough to maintain good posture. When working at a computer, place your monitor at a height where you are looking straight ahead and you do not have to tilt your head forward or downward to look at the screen. Resting When lying down and resting, avoid positions that are most painful for you. If you have pain with activities such as sitting, bending, stooping, or squatting, lie in a position in which your body does not bend very much. For example, avoid curling up on your side with your arms and knees near your chest (fetal position). If you have pain with activities such as standing for a long time or reaching with your arms, lie with your spine in a neutral position and bend your knees slightly. Try the following positions: Lying on your side with a pillow between your knees. Lying on your back with a pillow under your knees. Lifting  When lifting objects, keep your feet at least shoulder-width apart and tighten your abdominal muscles. Bend your knees and hips and keep your spine neutral. It is important to lift using the strength of your legs, not your back. Do not lock your knees straight out. Always ask for help to lift heavy or awkward objects. This information is not intended to replace advice given to you by your health care provider. Make sure you discuss any questions you have with your health care provider. Document Revised: 02/27/2023 Document Reviewed: 01/11/2021 [employer] Patient Education  2024 [employer].

## 2023-08-16 DIAGNOSIS — Z85828 Personal history of other malignant neoplasm of skin: Secondary | ICD-10-CM | POA: Diagnosis not present

## 2023-08-16 DIAGNOSIS — L821 Other seborrheic keratosis: Secondary | ICD-10-CM | POA: Diagnosis not present

## 2023-08-16 DIAGNOSIS — D2272 Melanocytic nevi of left lower limb, including hip: Secondary | ICD-10-CM | POA: Diagnosis not present

## 2023-08-16 DIAGNOSIS — L720 Epidermal cyst: Secondary | ICD-10-CM | POA: Diagnosis not present

## 2023-08-16 DIAGNOSIS — L814 Other melanin hyperpigmentation: Secondary | ICD-10-CM | POA: Diagnosis not present

## 2023-09-13 ENCOUNTER — Other Ambulatory Visit: Payer: Self-pay

## 2023-09-13 ENCOUNTER — Ambulatory Visit (HOSPITAL_BASED_OUTPATIENT_CLINIC_OR_DEPARTMENT_OTHER): Payer: Medicare Other | Attending: Rheumatology | Admitting: Physical Therapy

## 2023-09-13 ENCOUNTER — Encounter (HOSPITAL_BASED_OUTPATIENT_CLINIC_OR_DEPARTMENT_OTHER): Payer: Self-pay | Admitting: Physical Therapy

## 2023-09-13 DIAGNOSIS — G8929 Other chronic pain: Secondary | ICD-10-CM

## 2023-09-13 DIAGNOSIS — M2242 Chondromalacia patellae, left knee: Secondary | ICD-10-CM | POA: Diagnosis not present

## 2023-09-13 DIAGNOSIS — M25562 Pain in left knee: Secondary | ICD-10-CM | POA: Diagnosis not present

## 2023-09-13 DIAGNOSIS — M51369 Other intervertebral disc degeneration, lumbar region without mention of lumbar back pain or lower extremity pain: Secondary | ICD-10-CM | POA: Diagnosis not present

## 2023-09-13 DIAGNOSIS — M2241 Chondromalacia patellae, right knee: Secondary | ICD-10-CM | POA: Insufficient documentation

## 2023-09-13 DIAGNOSIS — M170 Bilateral primary osteoarthritis of knee: Secondary | ICD-10-CM | POA: Insufficient documentation

## 2023-09-13 DIAGNOSIS — M545 Low back pain, unspecified: Secondary | ICD-10-CM

## 2023-09-13 DIAGNOSIS — M25561 Pain in right knee: Secondary | ICD-10-CM | POA: Diagnosis not present

## 2023-09-13 NOTE — Therapy (Signed)
OUTPATIENT PHYSICAL THERAPY THORACOLUMBAR EVALUATION   Patient Name: Courtney Waters MRN: [id number] DOB:[birth year], 67 y.o., female Today's Date: 09/14/2023  END OF SESSION:  PT End of Session - 09/13/23 1255     Visit Number 1    Number of Visits 18    Date for PT Re-Evaluation 12/12/23    Authorization Type MCR    PT Start Time 1315    PT Stop Time 1400    PT Time Calculation (min) 45 min    Activity Tolerance Patient tolerated treatment well;Patient limited by pain    Behavior During Therapy [facility] for tasks assessed/performed             Past Medical History:  Diagnosis Date   Acid reflux    Arthritis    hands and neck   Back pain 12/01/1998   compression fracture L1 from sledding accident   Bladder infection    Depression    Fibromyalgia    Hearing loss in right ear    Heart murmur    Hiatal hernia 11/2017   MVP (mitral valve prolapse)    Hx   SVD (spontaneous vaginal delivery)    x 2   Past Surgical History:  Procedure Laterality Date   BASAL CELL CARCINOMA EXCISION  12/2021   nose   BUNIONECTOMY Left 10/16/2014   Dr. 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   EYE SURGERY Bilateral 2022   cataract surgery L eye 09/23/21 R eye 10/07/21   INSERTION OF MESH N/A 12/28/2021   Procedure: INSERTION OF MESH;  Surgeon: Axel Filler, MD;  Location: [facility] OR;  Service: General;  Laterality: N/A;   LAPAROSCOPIC BILATERAL SALPINGO OOPHERECTOMY Bilateral 05/13/2014   Procedure: LAPAROSCOPIC BILATERAL SALPINGO OOPHORECTOMY;  Surgeon: Annamaria Boots, MD;  Location: [facility] ORS;  Service: Gynecology;  Laterality: Bilateral;   toe nail removal Left 03/2018   TONSILLECTOMY  age 39   WISDOM TOOTH EXTRACTION  age 82   XI ROBOTIC ASSISTED HIATAL HERNIA REPAIR N/A 12/28/2021   Procedure: XI ROBOTIC ASSISTED HIATAL HERNIA REPAIR WITH MESH AND FUNDOPLICATION;   Surgeon: Axel Filler, MD;  Location: [facility] OR;  Service: General;  Laterality: N/A;   Patient Active Problem List   Diagnosis Date Noted   Basal cell carcinoma (BCC) of dorsum of nose 02/04/2022   S/P Nissen fundoplication (without gastrostomy tube) procedure 12/28/2021   Gastroesophageal reflux disease with hiatal hernia 11/12/2021   S/P BSO (bilateral salpingo-oophorectomy) 08/12/2021   Hiatal hernia 08/12/2021   Primary osteoarthritis of both knees 07/25/2017   Screening for osteoporosis 07/25/2017   DDD (degenerative disc disease), lumbar 07/25/2017   Primary osteoarthritis of both hands 01/26/2017   Primary osteoarthritis of both feet 01/26/2017   Other idiopathic scoliosis, lumbar region 01/26/2017   Fibromyalgia 02/05/2015   Postmenopausal 08/07/2014   Enthesopathy of ankle and tarsus 08/14/2007   BUNIONS, BILATERAL 08/14/2007   UNEQUAL LEG LENGTH, ACQUIRED 08/14/2007   HEEL PAIN, LEFT 08/08/2007   Osteopenia 08/08/2007    PCP: Garlan Fillers, MD   REFERRING PROVIDER: Pollyann Savoy, MD   REFERRING DIAG:  Diagnosis  M22.41,M22.42 (ICD-10-CM) - Chondromalacia of both patellae  M17.0 (ICD-10-CM) - Primary osteoarthritis of both knees  M51.369 (ICD-10-CM) - DDD (degenerative disc disease), lumbar    Rationale for Evaluation and Treatment: Rehabilitation  THERAPY DIAG:  Pain, lumbar region  Pain in joint  of right knee  Chronic pain of left knee  ONSET DATE: 2+ years   SUBJECTIVE:                                                                                                                                                                                           SUBJECTIVE STATEMENT: Pt has chronic pain from the neck down daily. Pt has history of fibro as well as history of compression fractures. Pt's knee pain did not respond with any major changes to hyaluronic acid injections into the knee. Pt has had improvement in pain with "Pain with Audrena" at the  [place] on Monday, Wed, and Fri. Pt does have upper neck and shoulder pain on the R. Pt notes that the knee has previously "given" out before with standing but has not happened recently. Pt has pain in multiple joint sites, especially into L hand/thumb. Pt does find that stress does effect her pain. Denies red flags.   Pt has had not had balance issues or falls.   PERTINENT HISTORY:  Previous L1 compressions fracture 2001  PAIN:  Are you having pain? Yes: NPRS scale: 4-5/10 Pain location: Lumbar, R neck/shoulder, bilat knees Pain description: varying- can be dull, ache, sharp Aggravating factors: slightly bent/flexed position at back; squatting, bending, stairs , transfers for knees Relieving factors: aquatic exercise, voltaren, heat, massage gun   PRECAUTIONS: None  RED FLAGS: None   WEIGHT BEARING RESTRICTIONS: No  FALLS:  Has patient fallen in last 6 months? 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  DIAGNOSTIC FINDINGS:  L knee xray  Impression: These findings are consistent with moderate chondromalacia  patella .   R knee xray Impression: These findings are consistent with mild osteoarthritis and  severe chondromalacia patella.   PATIENT SURVEYS:  FOTO 53 61 @ DC for knee  FOTO 53 60 @ DC for lumbar  SCREENING FOR RED FLAGS: Bowel or bladder incontinence: No Spinal tumors: No Cauda equina syndrome: No Compression fracture: No Abdominal aneurysm: No  COGNITION: Overall cognitive status: Within functional limits for tasks assessed     SENSATION: WFL  POSTURE: rounded shoulders, decreased lumbar lordosis, and increased  thoracic kyphosis  PALPATION: TTP of L UT and bilateral glutes; pressure of HHD painful to pt into LE  LUMBAR ROM:   AROM eval  Flexion 90%  Extension 50%  Right lateral flexion 50%  Left lateral flexion 50%  Right rotation 75%  Left rotation 75%   (Blank rows = not tested)  LOWER EXTREMITY ROM:     Active  Right eval Left eval  Hip flexion    Hip extension    Hip abduction    Hip adduction    Hip internal rotation    Hip external rotation 50 40  Knee flexion 125 125  Knee extension 0 -5  Ankle dorsiflexion    Ankle plantarflexion    Ankle inversion    Ankle eversion     (Blank rows = not tested)  LOWER EXTREMITY MMT:    MMT Right eval Left eval  Hip flexion 43.3 45.9  Hip extension    Hip abduction 38.1 33.3  Hip adduction    Hip internal rotation    Hip external rotation    Knee flexion    Knee extension 28.6 33.1  Ankle dorsiflexion    Ankle plantarflexion    Ankle inversion    Ankle eversion     (Blank rows = not tested)  LUMBAR SPECIAL TESTS:  Slump test: Negative  FUNCTIONAL TESTS:  5 times sit to stand: 10.9  GAIT: Distance walked: 44ft Assistive device utilized: None Level of assistance: Complete Independence Comments: mild flexed knee gait, no signficant deviations;   TODAY'S TREATMENT:                                                                                                                              DATE: 09/13/23   Exercises - Supine Posterior Pelvic Tilt  - 3 x daily - 7 x weekly - 2 sets - 10 reps - 2 hold - Hooklying Gluteal Sets  - 3 x daily - 7 x weekly - 2 sets - 10 reps - 2 hold - Supine Transversus Abdominis Bracing with Double Leg Fallout  - 3 x daily - 7 x weekly - 2 sets - 10 reps - 2 hold - Supine Ankle Pumps  - 5-6 x daily - 7 x weekly - 1 sets - 20 reps    PATIENT EDUCATION:  Education details: MOI, diagnosis, prognosis, anatomy, exercise progression, DOMS expectations, muscle firing,  envelope of function,  HEP, POC  Person educated: Patient Education method: Explanation, Demonstration, Tactile cues, Verbal cues, and Handouts Education comprehension: verbalized understanding, returned demonstration, verbal cues required, and tactile cues required  HOME EXERCISE PROGRAM:  Access Code: [email or web address] URL: [email or web address] Date: 09/13/2023 Prepared by: Zebedee Iba  ASSESSMENT:  CLINICAL IMPRESSION: Patient is a 67 y.o. female who was seen today for physical therapy evaluation and treatment for c/c of LBP and knee pain. Pt's s/s appear consistent with mechanical LBP from history of fracture and surgery as well as significant knee OA. Pt's pain is moderately sensitive and irritable with movement. Pt's is more strength limited at this time. Plan to continue with developing aquatic HEP and land  based strengthening at future sessions for knees and "core."  Pt would benefit from continued skilled therapy in order to reach goals and maximize functional LE and trunk strength for prevention of further functional decline.   OBJECTIVE IMPAIRMENTS: Abnormal gait, decreased activity tolerance, decreased balance, decreased endurance, decreased knowledge of use of DME, decreased mobility, difficulty walking, decreased ROM, decreased strength, hypomobility, impaired flexibility, improper body mechanics, postural dysfunction, and pain.    ACTIVITY LIMITATIONS: carrying, lifting, bending, standing, squatting, stairs, transfers, bed mobility, and locomotion level   PARTICIPATION LIMITATIONS: meal prep, cleaning, laundry, driving, shopping, and community activity and exercise   PERSONAL FACTORS: Age, Fitness, Time since onset of injury/illness/exacerbation, and 2+ co morbidities:    are also affecting patient's functional outcome.    REHAB POTENTIAL: Fair     CLINICAL DECISION MAKING: Evolving/moderate complexity   EVALUATION COMPLEXITY: Moderate     GOALS:     SHORT TERM GOALS: Target date:  10/25/2023      Pt will become independent with HEP in order to demonstrate synthesis of PT education.    Goal status: INITIAL   2.  Pt will report at least 2 pt reduction on NPRS scale for pain in order to demonstrate functional improvement with household activity, self care, and ADL.    Goal status: INITIAL   3.  Pt will be able to demonstrate/report ability to sit/stand/sleep for extended periods of time without pain in order to demonstrate functional improvement and tolerance to static positioning.    Goal status: INITIAL       LONG TERM GOALS: Target date: 12/06/2023      Pt  will become independent with final HEP in order to demonstrate synthesis of PT education.    Goal status: INITIAL   Pt will be able to demonstrate at least a 10lb increase in HHD testing for the LE in order to demonstrate improvement in LE strength for daily mobility.    Goal status: INITIAL   3.  Pt will be able to demonstrate/report ability to sit/stand/sleep for extended periods of time without pain in order to demonstrate functional improvement and tolerance to static positioning.     Goal status: INITIAL   4.  Pt will score >/= 60 and 61 on FOTO to demonstrate improvement in perceived lumbar  and knee function.    Goal status: INITIAL   5. Pt will be able to lift/squat/hold >25 lbs in order to demonstrate functional improvement in lumbopelvic strength for return ADL and exercise.  Goal status: INITIAL     PLAN:   PT FREQUENCY: 1-2x/week   PT DURATION: 12 weeks   PLANNED INTERVENTIONS: Therapeutic exercises, Therapeutic activity, Neuromuscular re-education, Balance training, Gait training, Patient/Family education, Self Care, Joint mobilization, Joint manipulation, Stair training, Prosthetic training, DME instructions, Aquatic Therapy, Dry Needling, Electrical stimulation, Spinal manipulation, Spinal mobilization, Moist heat, scar mobilization, Splintting, Taping, Vasopneumatic device,  Traction, Ultrasound, Ionotophoresis 4mg /ml Dexamethasone, Manual therapy, and Re-evaluation   PLAN FOR NEXT SESSION: develop aquatic HEP for lumbopelvic  and knee strength- pt is currently member of [employer] and utilizes aquatic exercise  Land based strengthening for lumbopelvic  and knee strength        Zebedee Iba, PT 09/14/2023, 8:42 AM

## 2023-09-19 ENCOUNTER — Ambulatory Visit (HOSPITAL_BASED_OUTPATIENT_CLINIC_OR_DEPARTMENT_OTHER): Payer: Medicare Other | Admitting: Physical Therapy

## 2023-09-19 ENCOUNTER — Encounter (HOSPITAL_BASED_OUTPATIENT_CLINIC_OR_DEPARTMENT_OTHER): Payer: Self-pay | Admitting: Physical Therapy

## 2023-09-19 DIAGNOSIS — M545 Low back pain, unspecified: Secondary | ICD-10-CM

## 2023-09-19 DIAGNOSIS — M2242 Chondromalacia patellae, left knee: Secondary | ICD-10-CM | POA: Diagnosis not present

## 2023-09-19 DIAGNOSIS — M51369 Other intervertebral disc degeneration, lumbar region without mention of lumbar back pain or lower extremity pain: Secondary | ICD-10-CM | POA: Diagnosis not present

## 2023-09-19 DIAGNOSIS — M2241 Chondromalacia patellae, right knee: Secondary | ICD-10-CM | POA: Diagnosis not present

## 2023-09-19 DIAGNOSIS — M25561 Pain in right knee: Secondary | ICD-10-CM

## 2023-09-19 DIAGNOSIS — G8929 Other chronic pain: Secondary | ICD-10-CM

## 2023-09-19 DIAGNOSIS — M170 Bilateral primary osteoarthritis of knee: Secondary | ICD-10-CM | POA: Diagnosis not present

## 2023-09-19 NOTE — Therapy (Signed)
OUTPATIENT PHYSICAL THERAPY THORACOLUMBAR EVALUATION   Patient Name: Courtney Waters MRN: [id number] DOB:10-11-1956, 67 y.o., female Today's Date: 09/19/2023  END OF SESSION:  PT End of Session - 09/19/23 1538     Visit Number 2    Number of Visits 18    Date for PT Re-Evaluation 12/12/23    Authorization Type MCR    PT Start Time 1538    PT Stop Time 1623    PT Time Calculation (min) 45 min    Activity Tolerance Patient tolerated treatment well;Patient limited by pain    Behavior During Therapy [facility] for tasks assessed/performed             Past Medical History:  Diagnosis Date   Acid reflux    Arthritis    hands and neck   Back pain 12/01/1998   compression fracture L1 from sledding accident   Bladder infection    Depression    Fibromyalgia    Hearing loss in right ear    Heart murmur    Hiatal hernia 11/2017   MVP (mitral valve prolapse)    Hx   SVD (spontaneous vaginal delivery)    x 2   Past Surgical History:  Procedure Laterality Date   BASAL CELL CARCINOMA EXCISION  12/2021   nose   BUNIONECTOMY Left 10/16/2014   Dr. 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   EYE SURGERY Bilateral 2022   cataract surgery L eye 09/23/21 R eye 10/07/21   INSERTION OF MESH N/A 12/28/2021   Procedure: INSERTION OF MESH;  Surgeon: Axel Filler, MD;  Location: [facility] OR;  Service: General;  Laterality: N/A;   LAPAROSCOPIC BILATERAL SALPINGO OOPHERECTOMY Bilateral 05/13/2014   Procedure: LAPAROSCOPIC BILATERAL SALPINGO OOPHORECTOMY;  Surgeon: Annamaria Boots, MD;  Location: [facility] ORS;  Service: Gynecology;  Laterality: Bilateral;   toe nail removal Left 03/2018   TONSILLECTOMY  age 72   WISDOM TOOTH EXTRACTION  age 55   XI ROBOTIC ASSISTED HIATAL HERNIA REPAIR N/A 12/28/2021   Procedure: XI ROBOTIC ASSISTED HIATAL HERNIA REPAIR WITH MESH AND FUNDOPLICATION;   Surgeon: Axel Filler, MD;  Location: [facility] OR;  Service: General;  Laterality: N/A;   Patient Active Problem List   Diagnosis Date Noted   Basal cell carcinoma (BCC) of dorsum of nose 02/04/2022   S/P Nissen fundoplication (without gastrostomy tube) procedure 12/28/2021   Gastroesophageal reflux disease with hiatal hernia 11/12/2021   S/P BSO (bilateral salpingo-oophorectomy) 08/12/2021   Hiatal hernia 08/12/2021   Primary osteoarthritis of both knees 07/25/2017   Screening for osteoporosis 07/25/2017   DDD (degenerative disc disease), lumbar 07/25/2017   Primary osteoarthritis of both hands 01/26/2017   Primary osteoarthritis of both feet 01/26/2017   Other idiopathic scoliosis, lumbar region 01/26/2017   Fibromyalgia 02/05/2015   Postmenopausal 08/07/2014   Enthesopathy of ankle and tarsus 08/14/2007   BUNIONS, BILATERAL 08/14/2007   UNEQUAL LEG LENGTH, ACQUIRED 08/14/2007   HEEL PAIN, LEFT 08/08/2007   Osteopenia 08/08/2007    PCP: Garlan Fillers, MD   REFERRING PROVIDER: Pollyann Savoy, MD   REFERRING DIAG:  Diagnosis  M22.41,M22.42 (ICD-10-CM) - Chondromalacia of both patellae  M17.0 (ICD-10-CM) - Primary osteoarthritis of both knees  M51.369 (ICD-10-CM) - DDD (degenerative disc disease), lumbar    Rationale for Evaluation and Treatment: Rehabilitation  THERAPY DIAG:  Pain, lumbar region  Pain in joint  of right knee  Chronic pain of left knee  ONSET DATE: 2+ years   SUBJECTIVE:                                                                                                                                                                                           SUBJECTIVE STATEMENT: Pt reports she goes to Less pain with Nierra class at [facility] 3 x week at [facility] tolerate well. Knee pain slightly higher than back today.   Pt has chronic pain from the neck down daily. Pt has history of fibro as well as history of compression fractures. Pt's knee pain did not  respond with any major changes to hyaluronic acid injections into the knee. Pt has had improvement in pain with "Pain with Aijalon" at the [place] on Monday, Wed, and Fri. Pt does have upper neck and shoulder pain on the R. Pt notes that the knee has previously "given" out before with standing but has not happened recently. Pt has pain in multiple joint sites, especially into L hand/thumb. Pt does find that stress does effect her pain. Denies red flags.   Pt has had not had balance issues or falls.   PERTINENT HISTORY:  Previous L1 compressions fracture 2001  PAIN:  Are you having pain? Yes: NPRS scale: 4-5/10 Pain location: Lumbar, R neck/shoulder, bilat knees Pain description: varying- can be dull, ache, sharp Aggravating factors: slightly bent/flexed position at back; squatting, bending, stairs , transfers for knees Relieving factors: aquatic exercise, voltaren, heat, massage gun   PRECAUTIONS: None  RED FLAGS: None   WEIGHT BEARING RESTRICTIONS: No  FALLS:  Has patient fallen in last 6 months? 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  DIAGNOSTIC FINDINGS:  L knee xray  Impression: These findings are consistent with moderate chondromalacia  patella .   R knee xray Impression: These findings are consistent with mild osteoarthritis and  severe chondromalacia patella.   PATIENT SURVEYS:  FOTO 53 61 @ DC for knee  FOTO 53 60 @ DC for lumbar  SCREENING FOR RED FLAGS: Bowel or bladder incontinence: No Spinal tumors: No Cauda equina syndrome: No Compression fracture: No Abdominal aneurysm: No  COGNITION: Overall cognitive status: Within  functional limits for tasks assessed     SENSATION: WFL  POSTURE: rounded shoulders, decreased lumbar lordosis, and  increased thoracic kyphosis  PALPATION: TTP of L UT and bilateral glutes; pressure of HHD painful to pt into LE  LUMBAR ROM:   AROM eval  Flexion 90%  Extension 50%  Right lateral flexion 50%  Left lateral flexion 50%  Right rotation 75%  Left rotation 75%   (Blank rows = not tested)  LOWER EXTREMITY ROM:     Active  Right eval Left eval  Hip flexion    Hip extension    Hip abduction    Hip adduction    Hip internal rotation    Hip external rotation 50 40  Knee flexion 125 125  Knee extension 0 -5  Ankle dorsiflexion    Ankle plantarflexion    Ankle inversion    Ankle eversion     (Blank rows = not tested)  LOWER EXTREMITY MMT:    MMT Right eval Left eval  Hip flexion 43.3 45.9  Hip extension    Hip abduction 38.1 33.3  Hip adduction    Hip internal rotation    Hip external rotation    Knee flexion    Knee extension 28.6 33.1  Ankle dorsiflexion    Ankle plantarflexion    Ankle inversion    Ankle eversion     (Blank rows = not tested)  LUMBAR SPECIAL TESTS:  Slump test: Negative  FUNCTIONAL TESTS:  5 times sit to stand: 10.9  GAIT: Distance walked: 50ft Assistive device utilized: None Level of assistance: Complete Independence Comments: mild flexed knee gait, no signficant deviations;   TODAY'S TREATMENT:                                                                                                                              Pt seen for aquatic therapy today.  Treatment took place in water 3.5-4.75 ft in depth at the [facility] pool. Temp of water was 91.  Pt entered/exited the pool via stair using step through pattern with hand rail.  *intro to setting *walking forward/back *HB carry yellow HB forward back and side stepping x 2 widths  *side lunge ue add/abd yellow HB x 4 widths *Solid noodle pull down  staggered stance then wide stance x 10.  Created some shoulder and cervical spine discomfort *staggered stance with shoulder horizontal add/abd *bow and arrow x 10 R/L *Straddling noodle cycling; hip add/abd; skiing *3 way hamstring/gastroc/adductor/IT band stretch *pt demonstrates on her own side to side pendulum; forward to back pendulum; suspended by yellow HB cycling   Pt requires the buoyancy and hydrostatic pressure of water for support, and to offload joints by unweighting joint load by at least 50 % in navel deep water and by at least 75-80% in chest to neck deep water.  Viscosity of the water is needed for resistance of strengthening. Water current perturbations provides challenge to standing balance requiring increased core activation.     Exercises - Supine Posterior Pelvic Tilt  - 3 x daily -  7 x weekly - 2 sets - 10 reps - 2 hold - Hooklying Gluteal Sets  - 3 x daily - 7 x weekly - 2 sets - 10 reps - 2 hold - Supine Transversus Abdominis Bracing with Double Leg Fallout  - 3 x daily - 7 x weekly - 2 sets - 10 reps - 2 hold - Supine Ankle Pumps  - 5-6 x daily - 7 x weekly - 1 sets - 20 reps    PATIENT EDUCATION:  Education details: MOI, diagnosis, prognosis, anatomy, exercise progression, DOMS expectations, muscle firing,  envelope of function, HEP, POC  Person educated: Patient Education method: Explanation, Demonstration, Tactile cues, Verbal cues, and Handouts Education comprehension: verbalized understanding, returned demonstration, verbal cues required, and tactile cues required  HOME EXERCISE PROGRAM:  Access Code: [email or web address] URL: [email or web address] Date: 09/13/2023 Prepared by: Zebedee Iba  ASSESSMENT:  CLINICAL IMPRESSION: Pt demonstrates safety and indep in water with therapist instructing from deck.  She is experienced with water exercising being an active participant in water aerobic classes 3 x week at [facility]. We focus on core  strengthening techniques as well as instruction on pain relieving techniques /positions.  She demonstrates high level of ability submerged.  She is knowledgeable and indep with a few of the high level exercises as noted in objective completing with good balance, strength and execution. She will be seen in aquatics x 2-3 more visits as approp then will return to land based intervention for loaded exercises to more efficiently build LE and core strength. Goals ongoing  Initial Impression Patient is a 67 y.o. female who was seen today for physical therapy evaluation and treatment for c/c of LBP and knee pain. Pt's s/s appear consistent with mechanical LBP from history of fracture and surgery as well as significant knee OA. Pt's pain is moderately sensitive and irritable with movement. Pt's is more strength limited at this time. Plan to continue with developing aquatic HEP and land based strengthening at future sessions for knees and "core."  Pt would benefit from continued skilled therapy in order to reach goals and maximize functional LE and trunk strength for prevention of further functional decline.   OBJECTIVE IMPAIRMENTS: Abnormal gait, decreased activity tolerance, decreased balance, decreased endurance, decreased knowledge of use of DME, decreased mobility, difficulty walking, decreased ROM, decreased strength, hypomobility, impaired flexibility, improper body mechanics, postural dysfunction, and pain.    ACTIVITY LIMITATIONS: carrying, lifting, bending, standing, squatting, stairs, transfers, bed mobility, and locomotion level   PARTICIPATION LIMITATIONS: meal prep, cleaning, laundry, driving, shopping, and community activity and exercise   PERSONAL FACTORS: Age, Fitness, Time since onset of injury/illness/exacerbation, and 2+ co morbidities:    are also affecting patient's functional outcome.    REHAB POTENTIAL: Fair     CLINICAL DECISION MAKING: Evolving/moderate complexity   EVALUATION  COMPLEXITY: Moderate     GOALS:     SHORT TERM GOALS: Target date: 10/25/2023      Pt will become independent with HEP in order to demonstrate synthesis of PT education.    Goal status: INITIAL   2.  Pt will report at least 2 pt reduction on NPRS scale for pain in order to demonstrate functional improvement with household activity, self care, and ADL.    Goal status: INITIAL   3.  Pt will be able to demonstrate/report ability to sit/stand/sleep for extended periods of time without pain in order to demonstrate functional improvement and tolerance to static positioning.    Goal status: INITIAL  LONG TERM GOALS: Target date: 12/06/2023      Pt  will become independent with final HEP in order to demonstrate synthesis of PT education.    Goal status: INITIAL   Pt will be able to demonstrate at least a 10lb increase in HHD testing for the LE in order to demonstrate improvement in LE strength for daily mobility.    Goal status: INITIAL   3.  Pt will be able to demonstrate/report ability to sit/stand/sleep for extended periods of time without pain in order to demonstrate functional improvement and tolerance to static positioning.     Goal status: INITIAL   4.  Pt will score >/= 60 and 61 on FOTO to demonstrate improvement in perceived lumbar  and knee function.    Goal status: INITIAL   5. Pt will be able to lift/squat/hold >25 lbs in order to demonstrate functional improvement in lumbopelvic strength for return ADL and exercise.  Goal status: INITIAL     PLAN:   PT FREQUENCY: 1-2x/week   PT DURATION: 12 weeks   PLANNED INTERVENTIONS: Therapeutic exercises, Therapeutic activity, Neuromuscular re-education, Balance training, Gait training, Patient/Family education, Self Care, Joint mobilization, Joint manipulation, Stair training, Prosthetic training, DME instructions, Aquatic Therapy, Dry Needling, Electrical stimulation, Spinal manipulation, Spinal mobilization,  Moist heat, scar mobilization, Splintting, Taping, Vasopneumatic device, Traction, Ultrasound, Ionotophoresis 4mg /ml Dexamethasone, Manual therapy, and Re-evaluation   PLAN FOR NEXT SESSION: develop aquatic HEP for lumbopelvic  and knee strength- pt is currently member of [employer] and utilizes aquatic exercise  Land based strengthening for lumbopelvic  and knee strength       Rushie Chestnut) Donevin Sainsbury MPT 09/19/23 4:50 PM [facility] [address] [place], [place], [address] Phone: [phone]   Fax:  [phone]

## 2023-09-20 ENCOUNTER — Ambulatory Visit (HOSPITAL_BASED_OUTPATIENT_CLINIC_OR_DEPARTMENT_OTHER): Payer: BLUE CROSS/BLUE SHIELD | Admitting: Obstetrics & Gynecology

## 2023-09-26 ENCOUNTER — Encounter (HOSPITAL_BASED_OUTPATIENT_CLINIC_OR_DEPARTMENT_OTHER): Payer: Self-pay | Admitting: Physical Therapy

## 2023-09-26 ENCOUNTER — Ambulatory Visit (HOSPITAL_BASED_OUTPATIENT_CLINIC_OR_DEPARTMENT_OTHER): Payer: Medicare Other | Admitting: Physical Therapy

## 2023-09-26 DIAGNOSIS — G8929 Other chronic pain: Secondary | ICD-10-CM

## 2023-09-26 DIAGNOSIS — M545 Low back pain, unspecified: Secondary | ICD-10-CM | POA: Diagnosis not present

## 2023-09-26 DIAGNOSIS — M2242 Chondromalacia patellae, left knee: Secondary | ICD-10-CM | POA: Diagnosis not present

## 2023-09-26 DIAGNOSIS — M2241 Chondromalacia patellae, right knee: Secondary | ICD-10-CM | POA: Diagnosis not present

## 2023-09-26 DIAGNOSIS — M25561 Pain in right knee: Secondary | ICD-10-CM | POA: Diagnosis not present

## 2023-09-26 DIAGNOSIS — M170 Bilateral primary osteoarthritis of knee: Secondary | ICD-10-CM | POA: Diagnosis not present

## 2023-09-26 DIAGNOSIS — M51369 Other intervertebral disc degeneration, lumbar region without mention of lumbar back pain or lower extremity pain: Secondary | ICD-10-CM | POA: Diagnosis not present

## 2023-09-26 NOTE — Therapy (Signed)
OUTPATIENT PHYSICAL THERAPY THORACOLUMBAR EVALUATION   Patient Name: Courtney Waters MRN: [id number] DOB:1956/10/10, 67 y.o., female Today's Date: 09/26/2023  END OF SESSION:  PT End of Session - 09/26/23 1524     Visit Number 3    Number of Visits 18    Date for PT Re-Evaluation 12/12/23    Authorization Type MCR    PT Start Time 1445    PT Stop Time 1530    PT Time Calculation (min) 45 min    Activity Tolerance Patient tolerated treatment well;Patient limited by pain    Behavior During Therapy [facility] for tasks assessed/performed              Past Medical History:  Diagnosis Date   Acid reflux    Arthritis    hands and neck   Back pain 12/01/1998   compression fracture L1 from sledding accident   Bladder infection    Depression    Fibromyalgia    Hearing loss in right ear    Heart murmur    Hiatal hernia 11/2017   MVP (mitral valve prolapse)    Hx   SVD (spontaneous vaginal delivery)    x 2   Past Surgical History:  Procedure Laterality Date   BASAL CELL CARCINOMA EXCISION  12/2021   nose   BUNIONECTOMY Left 10/16/2014   Dr. 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   EYE SURGERY Bilateral 2022   cataract surgery L eye 09/23/21 R eye 10/07/21   INSERTION OF MESH N/A 12/28/2021   Procedure: INSERTION OF MESH;  Surgeon: Axel Filler, MD;  Location: [facility] OR;  Service: General;  Laterality: N/A;   LAPAROSCOPIC BILATERAL SALPINGO OOPHERECTOMY Bilateral 05/13/2014   Procedure: LAPAROSCOPIC BILATERAL SALPINGO OOPHORECTOMY;  Surgeon: Annamaria Boots, MD;  Location: [facility] ORS;  Service: Gynecology;  Laterality: Bilateral;   toe nail removal Left 03/2018   TONSILLECTOMY  age 56   WISDOM TOOTH EXTRACTION  age 73   XI ROBOTIC ASSISTED HIATAL HERNIA REPAIR N/A 12/28/2021   Procedure: XI ROBOTIC ASSISTED HIATAL HERNIA REPAIR WITH MESH AND FUNDOPLICATION;   Surgeon: Axel Filler, MD;  Location[facility] OR;  Service: General;  Laterality: N/A;   Patient Active Problem List   Diagnosis Date Noted   Basal cell carcinoma (BCC) of dorsum of nose 02/04/2022   S/P Nissen fundoplication (without gastrostomy tube) procedure 12/28/2021   Gastroesophageal reflux disease with hiatal hernia 11/12/2021   S/P BSO (bilateral salpingo-oophorectomy) 08/12/2021   Hiatal hernia 08/12/2021   Primary osteoarthritis of both knees 07/25/2017   Screening for osteoporosis 07/25/2017   DDD (degenerative disc disease), lumbar 07/25/2017   Primary osteoarthritis of both hands 01/26/2017   Primary osteoarthritis of both feet 01/26/2017   Other idiopathic scoliosis, lumbar region 01/26/2017   Fibromyalgia 02/05/2015   Postmenopausal 08/07/2014   Enthesopathy of ankle and tarsus 08/14/2007   BUNIONS, BILATERAL 08/14/2007   UNEQUAL LEG LENGTH, ACQUIRED 08/14/2007   HEEL PAIN, LEFT 08/08/2007   Osteopenia 08/08/2007    PCP: Garlan Fillers, MD   REFERRING PROVIDER: Pollyann Savoy, MD   REFERRING DIAG:  Diagnosis  M22.41,M22.42 (ICD-10-CM) - Chondromalacia of both patellae  M17.0 (ICD-10-CM) - Primary osteoarthritis of both knees  M51.369 (ICD-10-CM) - DDD (degenerative disc disease), lumbar    Rationale for Evaluation and Treatment: Rehabilitation  THERAPY DIAG:  Pain, lumbar region  Pain in  joint of right knee  Chronic pain of left knee  ONSET DATE: 2+ years   SUBJECTIVE:                                                                                                                                                                                           SUBJECTIVE STATEMENT: Pt reports she has been to the [facility] and completed the Less [facility] With [facility] classes without difficulty.  "Pain is still there.  Knees/joints"  Initial Subjective Pt has chronic pain from the neck down daily. Pt has history of fibro as well as history of compression  fractures. Pt's knee pain did not respond with any major changes to hyaluronic acid injections into the knee. Pt has had improvement in pain with "Pain with [place]" at the [facility] on Monday, Wed, and Fri. Pt does have upper neck and shoulder pain on the R. Pt notes that the knee has previously "given" out before with standing but has not happened recently. Pt has pain in multiple joint sites, especially into L hand/thumb. Pt does find that stress does effect her pain. Denies red flags.   Pt has had not had balance issues or falls.   PERTINENT HISTORY:  Previous L1 compressions fracture 2001  PAIN:  Are you having pain? Yes: NPRS scale: 5/10 Pain location: Lumbar, R neck/shoulder, bilat knees Pain description: varying- can be dull, ache, sharp Aggravating factors: slightly bent/flexed position at back; squatting, bending, stairs , transfers for knees Relieving factors: aquatic exercise, voltaren, heat, massage gun   PRECAUTIONS: None  RED FLAGS: None   WEIGHT BEARING RESTRICTIONS: No  FALLS:  Has patient fallen in last 6 months? 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  DIAGNOSTIC FINDINGS:  L knee xray  Impression: These findings are consistent with moderate chondromalacia  patella .   R knee xray Impression: These findings are consistent with mild osteoarthritis and  severe chondromalacia patella.   PATIENT SURVEYS:  FOTO 53 61 @ DC for knee  FOTO 53 60 @ DC for lumbar  SCREENING FOR RED FLAGS: Bowel or bladder incontinence: No Spinal tumors: No Cauda equina syndrome: No Compression fracture: No Abdominal aneurysm:  No  COGNITION: Overall cognitive status: Within functional limits for tasks assessed     SENSATION: WFL  POSTURE: rounded shoulders, decreased lumbar lordosis,  and increased thoracic kyphosis  PALPATION: TTP of L UT and bilateral glutes; pressure of HHD painful to pt into LE  LUMBAR ROM:   AROM eval  Flexion 90%  Extension 50%  Right lateral flexion 50%  Left lateral flexion 50%  Right rotation 75%  Left rotation 75%   (Blank rows = not tested)  LOWER EXTREMITY ROM:     Active  Right eval Left eval  Hip flexion    Hip extension    Hip abduction    Hip adduction    Hip internal rotation    Hip external rotation 50 40  Knee flexion 125 125  Knee extension 0 -5  Ankle dorsiflexion    Ankle plantarflexion    Ankle inversion    Ankle eversion     (Blank rows = not tested)  LOWER EXTREMITY MMT:    MMT Right eval Left eval  Hip flexion 43.3 45.9  Hip extension    Hip abduction 38.1 33.3  Hip adduction    Hip internal rotation    Hip external rotation    Knee flexion    Knee extension 28.6 33.1  Ankle dorsiflexion    Ankle plantarflexion    Ankle inversion    Ankle eversion     (Blank rows = not tested)  LUMBAR SPECIAL TESTS:  Slump test: Negative  FUNCTIONAL TESTS:  5 times sit to stand: 10.9  GAIT: Distance walked: 68ft Assistive device utilized: None Level of assistance: Complete Independence Comments: mild flexed knee gait, no signficant deviations;   TODAY'S TREATMENT:                                                                                                                              Pt seen for aquatic therapy today.  Treatment took place in water 3.5-4.75 ft in depth at the [facility] pool. Temp of water was 91.  Pt entered/exited the pool via stair using step through pattern with hand rail.   *walking forward/back *HB carry yellow HB forward back and side stepping x 2 widths  *Solid noodle pull down staggered stance  then wide stance x 10.  Created some shoulder and cervical spine discomfort *staggered stance with shoulder horizontal add/abd *bow and arrow x 10 R/L *side lunge ue add/abd yellow HB x 4 widths *3 way hamstring/gastroc/adductor/IT band stretch *side to side pendulum; forward to back pendulum; suspended by yellow HB cycling   Pt requires the buoyancy and hydrostatic pressure of water for support, and to offload joints by unweighting joint load by at least 50 % in navel deep water and by at least 75-80% in chest to neck deep water.  Viscosity of the water is needed for resistance of strengthening. Water current perturbations provides challenge to standing balance requiring increased core activation.     Exercises - Supine Posterior Pelvic Tilt  - 3 x daily - 7 x weekly - 2 sets - 10 reps - 2 hold -  Hooklying Gluteal Sets  - 3 x daily - 7 x weekly - 2 sets - 10 reps - 2 hold - Supine Transversus Abdominis Bracing with Double Leg Fallout  - 3 x daily - 7 x weekly - 2 sets - 10 reps - 2 hold - Supine Ankle Pumps  - 5-6 x daily - 7 x weekly - 1 sets - 20 reps    PATIENT EDUCATION:  Education details: MOI, diagnosis, prognosis, anatomy, exercise progression, DOMS expectations, muscle firing,  envelope of function, HEP, POC  Person educated: Patient Education method: Explanation, Demonstration, Tactile cues, Verbal cues, and Handouts Education comprehension: verbalized understanding, returned demonstration, verbal cues required, and tactile cues required  HOME EXERCISE PROGRAM:  Access Code: [email or web address] URL: [email or web address] Date: 09/13/2023 Prepared by: Zebedee Iba  Access Code: [email or web address] URL: [email or web address] Date: 09/26/2023 Prepared by: Geni Bers  Exercises - Hand Buoy Carry  - 1 x daily - 1-3 x weekly - Drawing Bow  - 1 x daily - 1-3 x weekly - 1-3 sets - 10 reps - Side lunge with hand buoys  - 1 x daily - 1-3 x weekly - 1-2 sets - 10  reps - Side to Side Hamstring Stretch with Noodle at [employer]  - 1 x daily - 1-3 x weekly - Noodle press  - 1 x daily - 7 x weekly - 3 sets - 10 reps - Side to Side Pendulum Swing with Foam Dumbbells and Ankle Floats  - 1 x daily - 1-3 x weekly - 1-2 sets - 10 reps - Forward Backward Pendulum Swings with Hip Abduction and Adduction  - 1 x daily - 7 x weekly - 3 sets - 10 reps  ASSESSMENT:  CLINICAL IMPRESSION: Pt edu on fibromyalgia.  Pt encouraged to allow her body to rest when she id highly fatigues or if pain sensitivity begins exceeding 5/10.  She VU. She has completed 3 water aerobic classes at the [facility] since she was here last she reports with good toleration. She completes todays session requiring minimal VC.  Aquatic HEP creation begun.  Will plan to issue next session when she will transition completely back to land based intervention as she will be indep with aquatic management.    Initial Impression Patient is a 67 y.o. female who was seen today for physical therapy evaluation and treatment for c/c of LBP and knee pain. Pt's s/s appear consistent with mechanical LBP from history of fracture and surgery as well as significant knee OA. Pt's pain is moderately sensitive and irritable with movement. Pt's is more strength limited at this time. Plan to continue with developing aquatic HEP and land based strengthening at future sessions for knees and "core."  Pt would benefit from continued skilled therapy in order to reach goals and maximize functional LE and trunk strength for prevention of further functional decline.   OBJECTIVE IMPAIRMENTS: Abnormal gait, decreased activity tolerance, decreased balance, decreased endurance, decreased knowledge of use of DME, decreased mobility, difficulty walking, decreased ROM, decreased strength, hypomobility, impaired flexibility, improper body mechanics, postural dysfunction, and pain.    ACTIVITY LIMITATIONS: carrying, lifting, bending, standing,  squatting, stairs, transfers, bed mobility, and locomotion level   PARTICIPATION LIMITATIONS: meal prep, cleaning, laundry, driving, shopping, and community activity and exercise   PERSONAL FACTORS: Age, Fitness, Time since onset of injury/illness/exacerbation, and 2+ co morbidities:    are also affecting patient's functional outcome.    REHAB POTENTIAL: Fair     CLINICAL DECISION MAKING: Evolving/moderate complexity  EVALUATION COMPLEXITY: Moderate     GOALS:     SHORT TERM GOALS: Target date: 10/25/2023      Pt will become independent with HEP in order to demonstrate synthesis of PT education.    Goal status: INITIAL   2.  Pt will report at least 2 pt reduction on NPRS scale for pain in order to demonstrate functional improvement with household activity, self care, and ADL.    Goal status: INITIAL   3.  Pt will be able to demonstrate/report ability to sit/stand/sleep for extended periods of time without pain in order to demonstrate functional improvement and tolerance to static positioning.    Goal status: INITIAL       LONG TERM GOALS: Target date: 12/06/2023      Pt  will become independent with final HEP in order to demonstrate synthesis of PT education.    Goal status: INITIAL   Pt will be able to demonstrate at least a 10lb increase in HHD testing for the LE in order to demonstrate improvement in LE strength for daily mobility.    Goal status: INITIAL   3.  Pt will be able to demonstrate/report ability to sit/stand/sleep for extended periods of time without pain in order to demonstrate functional improvement and tolerance to static positioning.     Goal status: INITIAL   4.  Pt will score >/= 60 and 61 on FOTO to demonstrate improvement in perceived lumbar  and knee function.    Goal status: INITIAL   5. Pt will be able to lift/squat/hold >25 lbs in order to demonstrate functional improvement in lumbopelvic strength for return ADL and exercise.  Goal  status: INITIAL     PLAN:   PT FREQUENCY: 1-2x/week   PT DURATION: 12 weeks   PLANNED INTERVENTIONS: Therapeutic exercises, Therapeutic activity, Neuromuscular re-education, Balance training, Gait training, Patient/Family education, Self Care, Joint mobilization, Joint manipulation, Stair training, Prosthetic training, DME instructions, Aquatic Therapy, Dry Needling, Electrical stimulation, Spinal manipulation, Spinal mobilization, Moist heat, scar mobilization, Splintting, Taping, Vasopneumatic device, Traction, Ultrasound, Ionotophoresis 4mg /ml Dexamethasone, Manual therapy, and Re-evaluation   PLAN FOR NEXT SESSION: develop aquatic HEP for lumbopelvic  and knee strength- pt is currently member of [employer] and utilizes aquatic exercise  Land based strengthening for lumbopelvic  and knee strength       Rushie Chestnut) Minsa Weddington MPT 09/26/23 5:55 PM [facility] [address] [place], [place], [address] Phone: [phone]   Fax:  [phone]

## 2023-10-03 ENCOUNTER — Ambulatory Visit (HOSPITAL_BASED_OUTPATIENT_CLINIC_OR_DEPARTMENT_OTHER): Payer: Medicare Other | Admitting: Physical Therapy

## 2023-10-03 ENCOUNTER — Encounter (HOSPITAL_BASED_OUTPATIENT_CLINIC_OR_DEPARTMENT_OTHER): Payer: Self-pay | Admitting: Physical Therapy

## 2023-10-03 DIAGNOSIS — M2241 Chondromalacia patellae, right knee: Secondary | ICD-10-CM | POA: Diagnosis not present

## 2023-10-03 DIAGNOSIS — M51369 Other intervertebral disc degeneration, lumbar region without mention of lumbar back pain or lower extremity pain: Secondary | ICD-10-CM | POA: Diagnosis not present

## 2023-10-03 DIAGNOSIS — M170 Bilateral primary osteoarthritis of knee: Secondary | ICD-10-CM | POA: Diagnosis not present

## 2023-10-03 DIAGNOSIS — G8929 Other chronic pain: Secondary | ICD-10-CM

## 2023-10-03 DIAGNOSIS — M545 Low back pain, unspecified: Secondary | ICD-10-CM

## 2023-10-03 DIAGNOSIS — M25561 Pain in right knee: Secondary | ICD-10-CM

## 2023-10-03 DIAGNOSIS — M2242 Chondromalacia patellae, left knee: Secondary | ICD-10-CM | POA: Diagnosis not present

## 2023-10-03 NOTE — Therapy (Signed)
OUTPATIENT PHYSICAL THERAPY THORACOLUMBAR TREATMENT   Patient Name: Courtney Waters MRN: [id number] DOB:[birth year], 67 y.o., female Today's Date: 10/03/2023  END OF SESSION:  PT End of Session - 10/03/23 1617     Visit Number 4    Number of Visits 18    Date for PT Re-Evaluation 12/12/23    Authorization Type MCR    PT Start Time 1618    PT Stop Time 1658    PT Time Calculation (min) 40 min    Activity Tolerance Patient tolerated treatment well    Behavior During Therapy [facility] for tasks assessed/performed              Past Medical History:  Diagnosis Date   Acid reflux    Arthritis    hands and neck   Back pain 12/01/1998   compression fracture L1 from sledding accident   Bladder infection    Depression    Fibromyalgia    Hearing loss in right ear    Heart murmur    Hiatal hernia 11/2017   MVP (mitral valve prolapse)    Hx   SVD (spontaneous vaginal delivery)    x 2   Past Surgical History:  Procedure Laterality Date   BASAL CELL CARCINOMA EXCISION  12/2021   nose   BUNIONECTOMY Left 10/16/2014   Dr. 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   EYE SURGERY Bilateral 2022   cataract surgery L eye 09/23/21 R eye 10/07/21   INSERTION OF MESH N/A 12/28/2021   Procedure: INSERTION OF MESH;  Surgeon: Courtney Filler, MD;  Location: [facility] OR;  Service: General;  Laterality: N/A;   LAPAROSCOPIC BILATERAL SALPINGO OOPHERECTOMY Bilateral 05/13/2014   Procedure: LAPAROSCOPIC BILATERAL SALPINGO OOPHORECTOMY;  Surgeon: Courtney Boots, MD;  Location: [facility] ORS;  Service: Gynecology;  Laterality: Bilateral;   toe nail removal Left 03/2018   TONSILLECTOMY  age 70   WISDOM TOOTH EXTRACTION  age 46   XI ROBOTIC ASSISTED HIATAL HERNIA REPAIR N/A 12/28/2021   Procedure: XI ROBOTIC ASSISTED HIATAL HERNIA REPAIR WITH MESH AND FUNDOPLICATION;  Surgeon: Courtney Filler,  MD;  Location: [facility] OR;  Service: General;  Laterality: N/A;   Patient Active Problem List   Diagnosis Date Noted   Basal cell carcinoma (BCC) of dorsum of nose 02/04/2022   S/P Nissen fundoplication (without gastrostomy tube) procedure 12/28/2021   Gastroesophageal reflux disease with hiatal hernia 11/12/2021   S/P BSO (bilateral salpingo-oophorectomy) 08/12/2021   Hiatal hernia 08/12/2021   Primary osteoarthritis of both knees 07/25/2017   Screening for osteoporosis 07/25/2017   DDD (degenerative disc disease), lumbar 07/25/2017   Primary osteoarthritis of both hands 01/26/2017   Primary osteoarthritis of both feet 01/26/2017   Other idiopathic scoliosis, lumbar region 01/26/2017   Fibromyalgia 02/05/2015   Postmenopausal 08/07/2014   Enthesopathy of ankle and tarsus 08/14/2007   BUNIONS, BILATERAL 08/14/2007   UNEQUAL LEG LENGTH, ACQUIRED 08/14/2007   HEEL PAIN, LEFT 08/08/2007   Osteopenia 08/08/2007    PCP: Courtney Fillers, MD   REFERRING PROVIDER: Pollyann Savoy, MD   REFERRING DIAG:  Diagnosis  M22.41,M22.42 (ICD-10-CM) - Chondromalacia of both patellae  M17.0 (ICD-10-CM) - Primary osteoarthritis of both knees  M51.369 (ICD-10-CM) - DDD (degenerative disc disease), lumbar    Rationale for Evaluation and Treatment: Rehabilitation  THERAPY DIAG:  Pain, lumbar region  Pain in joint of right  knee  Chronic pain of left knee  ONSET DATE: 2+ years   SUBJECTIVE:                                                                                                                                                                                           SUBJECTIVE STATEMENT: Pt reports it is not a good day; has been cooking for Thanksgiving.  Pain is elevated due to cooking, even though she took lots of breaks.   Initial Subjective Pt has chronic pain from the neck down daily. Pt has history of fibro as well as history of compression fractures. Pt's knee pain did not  respond with any major changes to hyaluronic acid injections into the knee. Pt has had improvement in pain with "Pain with [name]" at the [place] on Monday, Wed, and Fri. Pt does have upper neck and shoulder pain on the R. Pt notes that the knee has previously "given" out before with standing but has not happened recently. Pt has pain in multiple joint sites, especially into L hand/thumb. Pt does find that stress does effect her pain. Denies red flags.   Pt has had not had balance issues or falls.   PERTINENT HISTORY:  Previous L1 compressions fracture 2001  PAIN:  Are you having pain? Yes: NPRS scale: 7-8/10 Pain location: Lumbar,  Pain description: varying- can be dull, ache, sharp Aggravating factors: slightly bent/flexed position at back Relieving factors: aquatic exercise, voltaren, heat, massage gun   PRECAUTIONS: None  RED FLAGS: None   WEIGHT BEARING RESTRICTIONS: No  FALLS:  Has patient fallen in last 6 months? 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  DIAGNOSTIC FINDINGS:  L knee xray  Impression: These findings are consistent with moderate chondromalacia  patella .   R knee xray Impression: These findings are consistent with mild osteoarthritis and  severe chondromalacia patella.   PATIENT SURVEYS:  FOTO 53 61 @ DC for knee  FOTO 53 60 @ DC for lumbar  SCREENING FOR RED FLAGS: Bowel or bladder incontinence: No Spinal tumors: No Cauda equina syndrome: No Compression fracture: No Abdominal aneurysm: No  COGNITION: Overall cognitive status: Within functional limits for tasks assessed     SENSATION: WFL  POSTURE: rounded  shoulders, decreased lumbar lordosis, and increased thoracic kyphosis  PALPATION: TTP of L UT  and bilateral glutes; pressure of HHD painful to pt into LE  LUMBAR ROM:   AROM eval  Flexion 90%  Extension 50%  Right lateral flexion 50%  Left lateral flexion 50%  Right rotation 75%  Left rotation 75%   (Blank rows = not tested)  LOWER EXTREMITY ROM:     Active  Right eval Left eval  Hip flexion    Hip extension    Hip abduction    Hip adduction    Hip internal rotation    Hip external rotation 50 40  Knee flexion 125 125  Knee extension 0 -5  Ankle dorsiflexion    Ankle plantarflexion    Ankle inversion    Ankle eversion     (Blank rows = not tested)  LOWER EXTREMITY MMT:    MMT Right eval Left eval  Hip flexion 43.3 45.9  Hip extension    Hip abduction 38.1 33.3  Hip adduction    Hip internal rotation    Hip external rotation    Knee flexion    Knee extension 28.6 33.1  Ankle dorsiflexion    Ankle plantarflexion    Ankle inversion    Ankle eversion     (Blank rows = not tested)  LUMBAR SPECIAL TESTS:  Slump test: Negative  FUNCTIONAL TESTS:  5 times sit to stand: 10.9  GAIT: Distance walked: 3ft Assistive device utilized: None Level of assistance: Complete Independence Comments: mild flexed knee gait, no signficant deviations;   TODAY'S TREATMENT:                                                                                                                              Pt seen for aquatic therapy today.  Treatment took place in water 3.5-4.75 ft in depth at the [facility] pool. Temp of water was 91.  Pt entered/exited the pool via stair using step through pattern with hand rail.  * straddling yellow noodle and cycling, cues to slow speed of LE; cross country ski; hip abdct/ addct  *farmer carry yellow hand floats forward back (bilat and single) * single leg supermans x 8 each LE * plank with UE on yellow hand floats with alternating  hip ext x 5 each  * TrA set with single/bilat rainbow pull downs to thighs with slow return to surface x 10 * bow and arrow with step back, x 10 each side, cues for form (neutral spine/ scap retraction) *side to side pendulum; forward to back pendulum x 5 of each  *Solid noodle pull down staggered stance then wide stance x 10.   *side step into squat with UE addct/abdct with yellow hand floats x 4 widths * verbal/visual demo of 3 way hamstring/gastroc/adductor/IT band stretch  Pt requires the buoyancy and hydrostatic pressure of water for support, and to offload joints by unweighting joint load by at least 50 % in navel deep water and by at least 75-80% in chest to neck deep  water.  Viscosity of the water is needed for resistance of strengthening. Water current perturbations provides challenge to standing balance requiring increased core activation.     PATIENT EDUCATION:  Education details: aquatic HEP -issued laminated program.  Person educated: Patient Education method: Explanation, Demonstration, Tactile cues, Verbal cues, and Handouts Education comprehension: verbalized understanding, returned demonstration, verbal cues required, and tactile cues required  HOME EXERCISE PROGRAM:  Access Code: [email or web address] URL: [email or web address] Date: 09/13/2023 Prepared by: Zebedee Iba  Access Code: [email or web address] URL: [email or web address] Date: 10/03/23 Prepared by: Geni Bers This aquatic home exercise program from [employer] utilizes pictures from land based exercises, but has been adapted prior to lamination and issuance.    ASSESSMENT:  CLINICAL IMPRESSION: Pt arrived with elevated pain level due to prolonged forward flexion while cooking today for holiday.  Pt reported gradual reduction of pain to 3/10 in back during session.   Pt encouraged to follow "less is more" on days when pain is heightened.  Pt observed with increased tightness in Lt ant hip, and Lt  lateral hip discomfort with Lt hip ext during cross country ski. She reports some relief with more ext based motions in water.  Pt to transition completely back to land based intervention as she is indep with aquatic exercise program.    Initial Impression Patient is a 67 y.o. female who was seen today for physical therapy evaluation and treatment for c/c of LBP and knee pain. Pt's s/s appear consistent with mechanical LBP from history of fracture and surgery as well as significant knee OA. Pt's pain is moderately sensitive and irritable with movement. Pt's is more strength limited at this time. Plan to continue with developing aquatic HEP and land based strengthening at future sessions for knees and "core."  Pt would benefit from continued skilled therapy in order to reach goals and maximize functional LE and trunk strength for prevention of further functional decline.   OBJECTIVE IMPAIRMENTS: Abnormal gait, decreased activity tolerance, decreased balance, decreased endurance, decreased knowledge of use of DME, decreased mobility, difficulty walking, decreased ROM, decreased strength, hypomobility, impaired flexibility, improper body mechanics, postural dysfunction, and pain.    ACTIVITY LIMITATIONS: carrying, lifting, bending, standing, squatting, stairs, transfers, bed mobility, and locomotion level   PARTICIPATION LIMITATIONS: meal prep, cleaning, laundry, driving, shopping, and community activity and exercise   PERSONAL FACTORS: Age, Fitness, Time since onset of injury/illness/exacerbation, and 2+ co morbidities:    are also affecting patient's functional outcome.    REHAB POTENTIAL: Fair     CLINICAL DECISION MAKING: Evolving/moderate complexity   EVALUATION COMPLEXITY: Moderate     GOALS:     SHORT TERM GOALS: Target date: 10/25/2023      Pt will become independent with HEP in order to demonstrate synthesis of PT education.    Goal status: INITIAL   2.  Pt will report at  least 2 pt reduction on NPRS scale for pain in order to demonstrate functional improvement with household activity, self care, and ADL.    Goal status: INITIAL   3.  Pt will be able to demonstrate/report ability to sit/stand/sleep for extended periods of time without pain in order to demonstrate functional improvement and tolerance to static positioning.    Goal status: INITIAL       LONG TERM GOALS: Target date: 12/06/2023      Pt  will become independent with final HEP in order to demonstrate synthesis of PT education.    Goal status: INITIAL   Pt will be able to  demonstrate at least a 10lb increase in HHD testing for the LE in order to demonstrate improvement in LE strength for daily mobility.    Goal status: INITIAL   3.  Pt will be able to demonstrate/report ability to sit/stand/sleep for extended periods of time without pain in order to demonstrate functional improvement and tolerance to static positioning.     Goal status: INITIAL   4.  Pt will score >/= 60 and 61 on FOTO to demonstrate improvement in perceived lumbar  and knee function.    Goal status: INITIAL   5. Pt will be able to lift/squat/hold >25 lbs in order to demonstrate functional improvement in lumbopelvic strength for return ADL and exercise.  Goal status: INITIAL     PLAN:   PT FREQUENCY: 1-2x/week   PT DURATION: 12 weeks   PLANNED INTERVENTIONS: Therapeutic exercises, Therapeutic activity, Neuromuscular re-education, Balance training, Gait training, Patient/Family education, Self Care, Joint mobilization, Joint manipulation, Stair training, Prosthetic training, DME instructions, Aquatic Therapy, Dry Needling, Electrical stimulation, Spinal manipulation, Spinal mobilization, Moist heat, scar mobilization, Splintting, Taping, Vasopneumatic device, Traction, Ultrasound, Ionotophoresis 4mg /ml Dexamethasone, Manual therapy, and Re-evaluation   PLAN FOR NEXT SESSION:  Land based strengthening for  lumbopelvic  and knee strength  Mayer Camel, PTA 10/03/23 5:26 PM [facility] [address] [place], [place], [address] Phone: [phone]   Fax:  [phone]

## 2023-10-10 ENCOUNTER — Ambulatory Visit (HOSPITAL_BASED_OUTPATIENT_CLINIC_OR_DEPARTMENT_OTHER): Payer: Medicare Other | Admitting: Physical Therapy

## 2023-10-11 ENCOUNTER — Ambulatory Visit (HOSPITAL_BASED_OUTPATIENT_CLINIC_OR_DEPARTMENT_OTHER): Payer: Medicare Other | Admitting: Physical Therapy

## 2023-10-12 ENCOUNTER — Telehealth: Payer: Self-pay

## 2023-10-12 ENCOUNTER — Ambulatory Visit (HOSPITAL_BASED_OUTPATIENT_CLINIC_OR_DEPARTMENT_OTHER): Payer: Medicare Other | Attending: Rheumatology | Admitting: Physical Therapy

## 2023-10-12 ENCOUNTER — Encounter (HOSPITAL_BASED_OUTPATIENT_CLINIC_OR_DEPARTMENT_OTHER): Payer: Self-pay | Admitting: Physical Therapy

## 2023-10-12 DIAGNOSIS — M545 Low back pain, unspecified: Secondary | ICD-10-CM | POA: Diagnosis not present

## 2023-10-12 DIAGNOSIS — G8929 Other chronic pain: Secondary | ICD-10-CM | POA: Diagnosis not present

## 2023-10-12 DIAGNOSIS — M25561 Pain in right knee: Secondary | ICD-10-CM | POA: Insufficient documentation

## 2023-10-12 DIAGNOSIS — M25562 Pain in left knee: Secondary | ICD-10-CM | POA: Diagnosis not present

## 2023-10-12 MED ORDER — DULOXETINE HCL 30 MG PO CPEP: 30.0000 mg | ORAL_CAPSULE | Freq: Every day | ORAL | 0 refills | Status: DC

## 2023-10-12 NOTE — Therapy (Signed)
OUTPATIENT PHYSICAL THERAPY THORACOLUMBAR TREATMENT   Patient Name: Courtney Waters MRN: [id number] DOB:04/29/1956, 67 y.o., female Today's Date: 10/12/2023  END OF SESSION:  PT End of Session - 10/12/23 1514     Visit Number 5    Number of Visits 18    Date for PT Re-Evaluation 12/12/23    Authorization Type MCR    PT Start Time 1514    PT Stop Time 1559    PT Time Calculation (min) 45 min    Activity Tolerance Patient tolerated treatment well    Behavior During Therapy [facility] for tasks assessed/performed               Past Medical History:  Diagnosis Date   Acid reflux    Arthritis    hands and neck   Back pain 12/01/1998   compression fracture L1 from sledding accident   Bladder infection    Depression    Fibromyalgia    Hearing loss in right ear    Heart murmur    Hiatal hernia 11/2017   MVP (mitral valve prolapse)    Hx   SVD (spontaneous vaginal delivery)    x 2   Past Surgical History:  Procedure Laterality Date   BASAL CELL CARCINOMA EXCISION  12/2021   nose   BUNIONECTOMY Left 10/16/2014   Dr. 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   EYE SURGERY Bilateral 2022   cataract surgery L eye 09/23/21 R eye 10/07/21   INSERTION OF MESH N/A 12/28/2021   Procedure: INSERTION OF MESH;  Surgeon: Axel Filler, MD;  Location: [facility] OR;  Service: General;  Laterality: N/A;   LAPAROSCOPIC BILATERAL SALPINGO OOPHERECTOMY Bilateral 05/13/2014   Procedure: LAPAROSCOPIC BILATERAL SALPINGO OOPHORECTOMY;  Surgeon: Annamaria Boots, MD;  Location: [facility] ORS;  Service: Gynecology;  Laterality: Bilateral;   toe nail removal Left 03/2018   TONSILLECTOMY  age 52   WISDOM TOOTH EXTRACTION  age 30   XI ROBOTIC ASSISTED HIATAL HERNIA REPAIR N/A 12/28/2021   Procedure: XI ROBOTIC ASSISTED HIATAL HERNIA REPAIR WITH MESH AND FUNDOPLICATION;  Surgeon: Axel Filler,  MD;  Location: [facility] OR;  Service: General;  Laterality: N/A;   Patient Active Problem List   Diagnosis Date Noted   Basal cell carcinoma (BCC) of dorsum of nose 02/04/2022   S/P Nissen fundoplication (without gastrostomy tube) procedure 12/28/2021   Gastroesophageal reflux disease with hiatal hernia 11/12/2021   S/P BSO (bilateral salpingo-oophorectomy) 08/12/2021   Hiatal hernia 08/12/2021   Primary osteoarthritis of both knees 07/25/2017   Screening for osteoporosis 07/25/2017   DDD (degenerative disc disease), lumbar 07/25/2017   Primary osteoarthritis of both hands 01/26/2017   Primary osteoarthritis of both feet 01/26/2017   Other idiopathic scoliosis, lumbar region 01/26/2017   Fibromyalgia 02/05/2015   Postmenopausal 08/07/2014   Enthesopathy of ankle and tarsus 08/14/2007   BUNIONS, BILATERAL 08/14/2007   UNEQUAL LEG LENGTH, ACQUIRED 08/14/2007   HEEL PAIN, LEFT 08/08/2007   Osteopenia 08/08/2007    PCP: Garlan Fillers, MD   REFERRING PROVIDER: Pollyann Savoy, MD   REFERRING DIAG:  Diagnosis  M22.41,M22.42 (ICD-10-CM) - Chondromalacia of both patellae  M17.0 (ICD-10-CM) - Primary osteoarthritis of both knees  M51.369 (ICD-10-CM) - DDD (degenerative disc disease), lumbar    Rationale for Evaluation and Treatment: Rehabilitation  THERAPY DIAG:  Pain, lumbar region  Pain in joint of  right knee  Chronic pain of left knee  ONSET DATE: 2+ years   SUBJECTIVE:                                                                                                                                                                                           SUBJECTIVE STATEMENT: Pain is not too bad, haven't really done much.   Initial Subjective Pt has chronic pain from the neck down daily. Pt has history of fibro as well as history of compression fractures. Pt's knee pain did not respond with any major changes to hyaluronic acid injections into the knee. Pt has had  improvement in pain with "[facility]" at the [place] on Monday, Wed, and Fri. Pt does have upper neck and shoulder pain on the R. Pt notes that the knee has previously "given" out before with standing but has not happened recently. Pt has pain in multiple joint sites, especially into L hand/thumb. Pt does find that stress does effect her pain. Denies red flags.   Pt has had not had balance issues or falls.   PERTINENT HISTORY:  Previous L1 compressions fracture 2001  PAIN:  Are you having pain? Yes: NPRS scale: 3-4/10 Pain location: Lumbar,  Pain description: varying- can be dull, ache, sharp Aggravating factors: slightly bent/flexed position at back Relieving factors: aquatic exercise, voltaren, heat, massage gun   PRECAUTIONS: None  RED FLAGS: None   WEIGHT BEARING RESTRICTIONS: No  FALLS:  Has patient fallen in last 6 months? 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  DIAGNOSTIC FINDINGS:  L knee xray  Impression: These findings are consistent with moderate chondromalacia  patella .   R knee xray Impression: These findings are consistent with mild osteoarthritis and  severe chondromalacia patella.   PATIENT SURVEYS:  FOTO 53 61 @ DC for knee  FOTO 53 60 @ DC for lumbar  SCREENING FOR RED FLAGS: Bowel or bladder incontinence: No Spinal tumors: No Cauda equina syndrome: No Compression fracture: No Abdominal aneurysm: No  COGNITION: Overall cognitive status: Within functional limits for tasks assessed     SENSATION: WFL  POSTURE: rounded shoulders, decreased lumbar lordosis, and increased thoracic  kyphosis  PALPATION: TTP of L UT and bilateral glutes; pressure of HHD painful to pt into LE  LUMBAR ROM:   AROM  eval  Flexion 90%  Extension 50%  Right lateral flexion 50%  Left lateral flexion 50%  Right rotation 75%  Left rotation 75%   (Blank rows = not tested)  LOWER EXTREMITY ROM:     Active  Right eval Left eval Left 12/5  Hip flexion     Hip extension     Hip abduction     Hip adduction     Hip internal rotation     Hip external rotation 50 40   Knee flexion 125 125   Knee extension 0 -5 0  Ankle dorsiflexion     Ankle plantarflexion     Ankle inversion     Ankle eversion      (Blank rows = not tested)  LOWER EXTREMITY MMT:    MMT Right eval Left eval Rt/Lt 12/5  Hip flexion 43.3 45.9   Hip extension     Hip abduction 38.1 33.3   Hip adduction     Hip internal rotation     Hip external rotation     Knee flexion     Knee extension 28.6 33.1 39.3/38.2  Ankle dorsiflexion     Ankle plantarflexion     Ankle inversion     Ankle eversion      (Blank rows = not tested)  LUMBAR SPECIAL TESTS:  Slump test: Negative  FUNCTIONAL TESTS:  5 times sit to stand: 10.9  GAIT: Distance walked: 77ft Assistive device utilized: None Level of assistance: Complete Independence Comments: mild flexed knee gait, no signficant deviations;   TODAY'S TREATMENT:                                                                                                                               Treatment                            12/5:  Seated piriformis stretch Hooklying core engagement, + abd press into green tband Hooklying UE press into swiss ball Seated on swiss ball ab set, + bouncing Standing lat press into swiss ball Standing ragdoll over swiss ball, head turned Lt and breathing into Rt ribcage     PATIENT EDUCATION:  Education details: aquatic HEP -issued laminated program.  Person educated: Patient Education method: Programmer, multimedia, Demonstration, Actor  cues, Verbal cues, and Handouts Education comprehension: verbalized understanding, returned demonstration, verbal cues required, and tactile cues required  HOME EXERCISE PROGRAM: Land: [email or web address]  Access Code: [email or web address] URL: [email or web address] Date: 10/03/23 Prepared by: Geni Bers This aquatic home exercise program from [employer] utilizes pictures from land based exercises, but has been adapted prior to lamination and issuance.    ASSESSMENT:  CLINICAL IMPRESSION: Pt requires heavy cuing as she has minimal awareness of core engagement patterns. Cues to reduce rib cage flare with ab set and breathe to expand Rt post, inf rib cage.     Initial Impression Patient is a 67 y.o.  female who was seen today for physical therapy evaluation and treatment for c/c of LBP and knee pain. Pt's s/s appear consistent with mechanical LBP from history of fracture and surgery as well as significant knee OA. Pt's pain is moderately sensitive and irritable with movement. Pt's is more strength limited at this time. Plan to continue with developing aquatic HEP and land based strengthening at future sessions for knees and "core."  Pt would benefit from continued skilled therapy in order to reach goals and maximize functional LE and trunk strength for prevention of further functional decline.   OBJECTIVE IMPAIRMENTS: Abnormal gait, decreased activity tolerance, decreased balance, decreased endurance, decreased knowledge of use of DME, decreased mobility, difficulty walking, decreased ROM, decreased strength, hypomobility, impaired flexibility, improper body mechanics, postural dysfunction, and pain.    ACTIVITY LIMITATIONS: carrying, lifting, bending, standing, squatting, stairs, transfers, bed mobility, and locomotion level   PARTICIPATION LIMITATIONS: meal prep, cleaning, laundry, driving, shopping, and community activity and exercise   PERSONAL FACTORS: Age, Fitness, Time since onset of  injury/illness/exacerbation, and 2+ co morbidities:    are also affecting patient's functional outcome.    REHAB POTENTIAL: Fair     CLINICAL DECISION MAKING: Evolving/moderate complexity   EVALUATION COMPLEXITY: Moderate     GOALS:     SHORT TERM GOALS: Target date: 10/25/2023      Pt will become independent with HEP in order to demonstrate synthesis of PT education.    Goal status: INITIAL   2.  Pt will report at least 2 pt reduction on NPRS scale for pain in order to demonstrate functional improvement with household activity, self care, and ADL.    Goal status: INITIAL   3.  Pt will be able to demonstrate/report ability to sit/stand/sleep for extended periods of time without pain in order to demonstrate functional improvement and tolerance to static positioning.    Goal status: INITIAL       LONG TERM GOALS: Target date: 12/06/2023      Pt  will become independent with final HEP in order to demonstrate synthesis of PT education.    Goal status: INITIAL   Pt will be able to demonstrate at least a 10lb increase in HHD testing for the LE in order to demonstrate improvement in LE strength for daily mobility.    Goal status: INITIAL   3.  Pt will be able to demonstrate/report ability to sit/stand/sleep for extended periods of time without pain in order to demonstrate functional improvement and tolerance to static positioning.     Goal status: INITIAL   4.  Pt will score >/= 60 and 61 on FOTO to demonstrate improvement in perceived lumbar  and knee function.    Goal status: INITIAL   5. Pt will be able to lift/squat/hold >25 lbs in order to demonstrate functional improvement in lumbopelvic strength for return ADL and exercise.  Goal status: INITIAL     PLAN:   PT FREQUENCY: 1-2x/week   PT DURATION: 12 weeks   PLANNED INTERVENTIONS: Therapeutic exercises, Therapeutic activity, Neuromuscular re-education, Balance training, Gait training, Patient/Family  education, Self Care, Joint mobilization, Joint manipulation, Stair training, Prosthetic training, DME instructions, Aquatic Therapy, Dry Needling, Electrical stimulation, Spinal manipulation, Spinal mobilization, Moist heat, scar mobilization, Splintting, Taping, Vasopneumatic device, Traction, Ultrasound, Ionotophoresis 4mg /ml Dexamethasone, Manual therapy, and Re-evaluation   PLAN FOR NEXT SESSION:  Land based strengthening for lumbopelvic  and knee strength  Ian Castagna C. Andrez Lieurance PT, DPT 10/12/23 4:00 PM  [facility]  [address] [place], [place], [address] Phone: [phone]   Fax:  [phone]

## 2023-10-12 NOTE — Telephone Encounter (Signed)
Patient requested refill of cymbalta to [facility] [address].   Patient states she is also experiencing increased joint pain and has a friend that recommended meloxicam. Patient would like to know if you would possibly prescribe this for her?   I did advise patient that we do not make medication changes via telephone calls and that she would need to come in for a visit for potential medication changes, joint examination and possible labs. Patient then asked if coming in would affect her insurance, I advised patient I could not answer that and she would have to contact her insurance company.     Last Fill: 07/17/2023  Next Visit: 02/06/2024  Last Visit: 08/08/2023  DX: Fibromyalgia   Current Dose per office note on 08/08/2023: Cymbalta 30 mg 1 capsule by mouth daily.   Okay to refill cymbalta?

## 2023-10-13 ENCOUNTER — Encounter (HOSPITAL_BASED_OUTPATIENT_CLINIC_OR_DEPARTMENT_OTHER): Payer: Medicare Other | Admitting: Physical Therapy

## 2023-10-13 NOTE — Telephone Encounter (Signed)
I called patient, patient is taking Tylenol x 2 daily, patient does not want referral to pain management, patient has tried pain management which was not successful, patient will contact PCP, patient verbalized understanding.

## 2023-10-13 NOTE — Telephone Encounter (Signed)
Please advise on patient's question regarding meloxicam. Thanks!

## 2023-10-13 NOTE — Telephone Encounter (Signed)
Patient's creatinine has been elevated in the past.  Meloxicam would not be a good choice as it can liver and kidney toxicity.  It also increases the risk of GI bleeding after the age 67.  She may consider taking Tylenol for pain management.

## 2023-10-17 ENCOUNTER — Ambulatory Visit (HOSPITAL_BASED_OUTPATIENT_CLINIC_OR_DEPARTMENT_OTHER): Payer: Medicare Other | Admitting: Physical Therapy

## 2023-10-19 ENCOUNTER — Ambulatory Visit (HOSPITAL_BASED_OUTPATIENT_CLINIC_OR_DEPARTMENT_OTHER): Payer: Medicare Other | Admitting: Physical Therapy

## 2023-10-24 ENCOUNTER — Encounter (HOSPITAL_BASED_OUTPATIENT_CLINIC_OR_DEPARTMENT_OTHER): Payer: Self-pay | Admitting: Physical Therapy

## 2023-10-24 ENCOUNTER — Ambulatory Visit (HOSPITAL_BASED_OUTPATIENT_CLINIC_OR_DEPARTMENT_OTHER): Payer: Medicare Other | Admitting: Physical Therapy

## 2023-10-24 DIAGNOSIS — M25561 Pain in right knee: Secondary | ICD-10-CM

## 2023-10-24 DIAGNOSIS — G8929 Other chronic pain: Secondary | ICD-10-CM | POA: Diagnosis not present

## 2023-10-24 DIAGNOSIS — M545 Low back pain, unspecified: Secondary | ICD-10-CM | POA: Diagnosis not present

## 2023-10-24 DIAGNOSIS — M25562 Pain in left knee: Secondary | ICD-10-CM | POA: Diagnosis not present

## 2023-10-24 NOTE — Therapy (Signed)
OUTPATIENT PHYSICAL THERAPY THORACOLUMBAR TREATMENT   Patient Name: Laporsha Hafele MRN: [id number] DOB:[birth year], 67 y.o., female Today's Date: 10/24/2023  END OF SESSION:  PT End of Session - 10/24/23 1319     Visit Number 6    Number of Visits 18    Date for PT Re-Evaluation 12/12/23    Authorization Type MCR    PT Start Time 1315    PT Stop Time 1354    PT Time Calculation (min) 39 min    Activity Tolerance Patient tolerated treatment well    Behavior During Therapy [facility] for tasks assessed/performed                Past Medical History:  Diagnosis Date   Acid reflux    Arthritis    hands and neck   Back pain 12/01/1998   compression fracture L1 from sledding accident   Bladder infection    Depression    Fibromyalgia    Hearing loss in right ear    Heart murmur    Hiatal hernia 11/2017   MVP (mitral valve prolapse)    Hx   SVD (spontaneous vaginal delivery)    x 2   Past Surgical History:  Procedure Laterality Date   BASAL CELL CARCINOMA EXCISION  12/2021   nose   BUNIONECTOMY Left 10/16/2014   Dr. 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   EYE SURGERY Bilateral 2022   cataract surgery L eye 09/23/21 R eye 10/07/21   INSERTION OF MESH N/A 12/28/2021   Procedure: INSERTION OF MESH;  Surgeon: Axel Filler, MD;  Location: [facility] OR;  Service: General;  Laterality: N/A;   LAPAROSCOPIC BILATERAL SALPINGO OOPHERECTOMY Bilateral 05/13/2014   Procedure: LAPAROSCOPIC BILATERAL SALPINGO OOPHORECTOMY;  Surgeon: Annamaria Boots, MD;  Location: [facility] ORS;  Service: Gynecology;  Laterality: Bilateral;   toe nail removal Left 03/2018   TONSILLECTOMY  age 89   WISDOM TOOTH EXTRACTION  age 5   XI ROBOTIC ASSISTED HIATAL HERNIA REPAIR N/A 12/28/2021   Procedure: XI ROBOTIC ASSISTED HIATAL HERNIA REPAIR WITH MESH AND FUNDOPLICATION;  Surgeon: Axel Filler, MD;  Location: [facility] OR;  Service: General;  Laterality: N/A;   Patient Active Problem List   Diagnosis Date Noted   Basal cell carcinoma (BCC) of dorsum of nose 02/04/2022   S/P Nissen fundoplication (without gastrostomy tube) procedure 12/28/2021   Gastroesophageal reflux disease with hiatal hernia 11/12/2021   S/P BSO (bilateral salpingo-oophorectomy) 08/12/2021   Hiatal hernia 08/12/2021   Primary osteoarthritis of both knees 07/25/2017   Screening for osteoporosis 07/25/2017   DDD (degenerative disc disease), lumbar 07/25/2017   Primary osteoarthritis of both hands 01/26/2017   Primary osteoarthritis of both feet 01/26/2017   Other idiopathic scoliosis, lumbar region 01/26/2017   Fibromyalgia 02/05/2015   Postmenopausal 08/07/2014   Enthesopathy of ankle and tarsus 08/14/2007   BUNIONS, BILATERAL 08/14/2007   UNEQUAL LEG LENGTH, ACQUIRED 08/14/2007   HEEL PAIN, LEFT 08/08/2007   Osteopenia 08/08/2007    PCP: Garlan Fillers, MD   REFERRING PROVIDER: Pollyann Savoy, MD   REFERRING DIAG:  Diagnosis  M22.41,M22.42 (ICD-10-CM) - Chondromalacia of both patellae  M17.0 (ICD-10-CM) - Primary osteoarthritis of both knees  M51.369 (ICD-10-CM) - DDD (degenerative disc disease), lumbar    Rationale for Evaluation and Treatment: Rehabilitation  THERAPY DIAG:  Pain, lumbar region  Pain in joint  of right knee  Chronic pain of left knee  ONSET DATE: 2+ years   SUBJECTIVE:                                                                                                                                                                                           SUBJECTIVE STATEMENT: Pt states she is in pain today. She woke up stuff and in pain. She feels pain into her hands and neck.   Initial Subjective Pt has chronic pain from the neck down daily. Pt has history of fibro as well as history of compression fractures. Pt's knee pain did not respond with any major  changes to hyaluronic acid injections into the knee. Pt has had improvement in pain with "Pain with [facility]" at the [facility] on Monday, Wed, and Fri. Pt does have upper neck and shoulder pain on the R. Pt notes that the knee has previously "given" out before with standing but has not happened recently. Pt has pain in multiple joint sites, especially into L hand/thumb. Pt does find that stress does effect her pain. Denies red flags.   Pt has had not had balance issues or falls.   PERTINENT HISTORY:  Previous L1 compressions fracture 2001  PAIN:  Are you having pain? Yes: NPRS scale: 5/10 Pain location: upper back/neck Pain description: varying- can be dull, ache, sharp Aggravating factors: slightly bent/flexed position at back Relieving factors: aquatic exercise, voltaren, heat, massage gun   PRECAUTIONS: None  RED FLAGS: None   WEIGHT BEARING RESTRICTIONS: No  FALLS:  Has patient fallen in last 6 months? 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  DIAGNOSTIC FINDINGS:  L knee xray  Impression: These findings are consistent with moderate chondromalacia  patella .   R knee xray Impression: These findings are consistent with mild osteoarthritis and  severe chondromalacia patella.   PATIENT SURVEYS:  FOTO 53 61 @ DC for knee  FOTO 53 60 @ DC for lumbar  SCREENING FOR RED FLAGS: Bowel or bladder incontinence: No Spinal tumors: No Cauda equina syndrome: No Compression fracture: No Abdominal aneurysm: No  COGNITION: Overall cognitive status: Within functional limits for tasks assessed     SENSATION: WFL  POSTURE: rounded shoulders,  decreased lumbar lordosis, and increased thoracic kyphosis  PALPATION: TTP of L UT and bilateral glutes;  pressure of HHD painful to pt into LE  LUMBAR ROM:   AROM eval  Flexion 90%  Extension 50%  Right lateral flexion 50%  Left lateral flexion 50%  Right rotation 75%  Left rotation 75%   (Blank rows = not tested)  LOWER EXTREMITY ROM:     Active  Right eval Left eval Left 12/5  Hip flexion     Hip extension     Hip abduction     Hip adduction     Hip internal rotation     Hip external rotation 50 40   Knee flexion 125 125   Knee extension 0 -5 0  Ankle dorsiflexion     Ankle plantarflexion     Ankle inversion     Ankle eversion      (Blank rows = not tested)  LOWER EXTREMITY MMT:    MMT Right eval Left eval Rt/Lt 12/5  Hip flexion 43.3 45.9   Hip extension     Hip abduction 38.1 33.3   Hip adduction     Hip internal rotation     Hip external rotation     Knee flexion     Knee extension 28.6 33.1 39.3/38.2  Ankle dorsiflexion     Ankle plantarflexion     Ankle inversion     Ankle eversion      (Blank rows = not tested)  LUMBAR SPECIAL TESTS:  Slump test: Negative  FUNCTIONAL TESTS:  5 times sit to stand: 10.9  GAIT: Distance walked: 16ft Assistive device utilized: None Level of assistance: Complete Independence Comments: mild flexed knee gait, no signficant deviations;   TODAY'S TREATMENT:    Treatment                            12/17:  Nustep Lvl 5 5 min  Shuttle leg press YTB at knees 75lbs 3x10 RTB row 2x10 RTB paloff 2x10 S/L clamshell 3s holds 2x10 each Bridge 2x10 YTB at knees Supine piriformis stretch 30s 3x                                                                                                            Treatment                            12/5:  Seated piriformis stretch Hooklying core engagement, + abd press into green tband Hooklying UE press into swiss ball Seated on swiss ball ab set, + bouncing Standing lat  press into swiss ball Standing ragdoll over swiss ball, head turned Lt and breathing into Rt ribcage     PATIENT EDUCATION:  Education details: aquatic HEP -issued laminated program.  Person educated: Patient Education method: Programmer, multimedia, Demonstration, Actor cues, Verbal cues, and Handouts Education comprehension: verbalized understanding, returned demonstration, verbal cues required, and tactile cues required  HOME EXERCISE PROGRAM: Land: [email or web address]  Access Code: [email or web address] URL: [email or web address] Date: 10/03/23 Prepared by: Geni Bers This aquatic home exercise program from [employer]  utilizes pictures from land based exercises, but has been adapted prior to lamination and issuance.    ASSESSMENT:  CLINICAL IMPRESSION: Pt HEP progressed today for progressive hip, core, and UE strengthening without increase in pain. Pt does have increase in muscular fatigue but no exacerbation of pain with new resisted exercise. Pt does require heavy cuing for LE alignment and hip ABD activation. Tactile cuing required with band to maintain position. Progress land based strength for lumbopelvic region and knees as tolerated. Pt would benefit from continued skilled therapy in order to reach goals and maximize functional LE and trunk strength for prevention of further functional decline.   Initial Impression Patient is a 67 y.o. female who was seen today for physical therapy evaluation and treatment for c/c of LBP and knee pain. Pt's s/s appear consistent with mechanical LBP from history of fracture and surgery as well as significant knee OA. Pt's pain is moderately sensitive and irritable with movement. Pt's is more strength limited at this time. Plan to continue with developing aquatic HEP and land based strengthening at future sessions for knees and "core."  Pt would benefit from continued skilled therapy in order to reach goals and maximize functional LE and trunk strength for  prevention of further functional decline.   OBJECTIVE IMPAIRMENTS: Abnormal gait, decreased activity tolerance, decreased balance, decreased endurance, decreased knowledge of use of DME, decreased mobility, difficulty walking, decreased ROM, decreased strength, hypomobility, impaired flexibility, improper body mechanics, postural dysfunction, and pain.    ACTIVITY LIMITATIONS: carrying, lifting, bending, standing, squatting, stairs, transfers, bed mobility, and locomotion level   PARTICIPATION LIMITATIONS: meal prep, cleaning, laundry, driving, shopping, and community activity and exercise   PERSONAL FACTORS: Age, Fitness, Time since onset of injury/illness/exacerbation, and 2+ co morbidities:    are also affecting patient's functional outcome.    REHAB POTENTIAL: Fair     CLINICAL DECISION MAKING: Evolving/moderate complexity   EVALUATION COMPLEXITY: Moderate     GOALS:     SHORT TERM GOALS: Target date: 10/25/2023      Pt will become independent with HEP in order to demonstrate synthesis of PT education.    Goal status: INITIAL   2.  Pt will report at least 2 pt reduction on NPRS scale for pain in order to demonstrate functional improvement with household activity, self care, and ADL.    Goal status: INITIAL   3.  Pt will be able to demonstrate/report ability to sit/stand/sleep for extended periods of time without pain in order to demonstrate functional improvement and tolerance to static positioning.    Goal status: INITIAL       LONG TERM GOALS: Target date: 12/06/2023      Pt  will become independent with final HEP in order to demonstrate synthesis of PT education.    Goal status: INITIAL   Pt will be able to demonstrate at least a 10lb increase in HHD testing for the LE in order to demonstrate improvement in LE strength for daily mobility.    Goal status: INITIAL   3.  Pt will be able to demonstrate/report ability to sit/stand/sleep for extended periods of time  without pain in order to demonstrate functional improvement and tolerance to static positioning.     Goal status: INITIAL   4.  Pt will score >/= 60 and 61 on FOTO to demonstrate improvement in perceived lumbar  and knee function.    Goal status: INITIAL   5. Pt will be able to lift/squat/hold >25 lbs in order to  demonstrate functional improvement in lumbopelvic strength for return ADL and exercise.  Goal status: INITIAL     PLAN:   PT FREQUENCY: 1-2x/week   PT DURATION: 12 weeks   PLANNED INTERVENTIONS: Therapeutic exercises, Therapeutic activity, Neuromuscular re-education, Balance training, Gait training, Patient/Family education, Self Care, Joint mobilization, Joint manipulation, Stair training, Prosthetic training, DME instructions, Aquatic Therapy, Dry Needling, Electrical stimulation, Spinal manipulation, Spinal mobilization, Moist heat, scar mobilization, Splintting, Taping, Vasopneumatic device, Traction, Ultrasound, Ionotophoresis 4mg /ml Dexamethasone, Manual therapy, and Re-evaluation   PLAN FOR NEXT SESSION:  Land based strengthening for lumbopelvic  and knee strength  Jessica C. Hightower PT, DPT 10/24/23 2:00 PM  [facility] [address] [place], [place], [address] Phone: [phone]   Fax:  [phone]

## 2023-10-26 ENCOUNTER — Ambulatory Visit (HOSPITAL_BASED_OUTPATIENT_CLINIC_OR_DEPARTMENT_OTHER): Payer: Medicare Other | Admitting: Physical Therapy

## 2023-10-26 ENCOUNTER — Encounter (HOSPITAL_BASED_OUTPATIENT_CLINIC_OR_DEPARTMENT_OTHER): Payer: Self-pay | Admitting: Physical Therapy

## 2023-10-26 DIAGNOSIS — M25561 Pain in right knee: Secondary | ICD-10-CM | POA: Diagnosis not present

## 2023-10-26 DIAGNOSIS — D649 Anemia, unspecified: Secondary | ICD-10-CM | POA: Diagnosis not present

## 2023-10-26 DIAGNOSIS — G8929 Other chronic pain: Secondary | ICD-10-CM

## 2023-10-26 DIAGNOSIS — Z23 Encounter for immunization: Secondary | ICD-10-CM | POA: Diagnosis not present

## 2023-10-26 DIAGNOSIS — M545 Low back pain, unspecified: Secondary | ICD-10-CM

## 2023-10-26 DIAGNOSIS — K219 Gastro-esophageal reflux disease without esophagitis: Secondary | ICD-10-CM | POA: Diagnosis not present

## 2023-10-26 DIAGNOSIS — M797 Fibromyalgia: Secondary | ICD-10-CM | POA: Diagnosis not present

## 2023-10-26 DIAGNOSIS — M419 Scoliosis, unspecified: Secondary | ICD-10-CM | POA: Diagnosis not present

## 2023-10-26 DIAGNOSIS — M25562 Pain in left knee: Secondary | ICD-10-CM | POA: Diagnosis not present

## 2023-10-26 NOTE — Therapy (Signed)
OUTPATIENT PHYSICAL THERAPY THORACOLUMBAR TREATMENT   Patient Name: Courtney Waters MRN: [id number] DOB:1956/06/17, 67 y.o., female Today's Date: 10/26/2023  END OF SESSION:  PT End of Session - 10/26/23 1304     Visit Number 7    Number of Visits 18    Date for PT Re-Evaluation 12/12/23    Authorization Type MCR    PT Start Time 1315    PT Stop Time 1355    PT Time Calculation (min) 40 min    Activity Tolerance Patient tolerated treatment well    Behavior During Therapy [facility] for tasks assessed/performed                Past Medical History:  Diagnosis Date   Acid reflux    Arthritis    hands and neck   Back pain 12/01/1998   compression fracture L1 from sledding accident   Bladder infection    Depression    Fibromyalgia    Hearing loss in right ear    Heart murmur    Hiatal hernia 11/2017   MVP (mitral valve prolapse)    Hx   SVD (spontaneous vaginal delivery)    x 2   Past Surgical History:  Procedure Laterality Date   BASAL CELL CARCINOMA EXCISION  12/2021   nose   BUNIONECTOMY Left 10/16/2014   Dr. 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   EYE SURGERY Bilateral 2022   cataract surgery L eye 09/23/21 R eye 10/07/21   INSERTION OF MESH N/A 12/28/2021   Procedure: INSERTION OF MESH;  Surgeon: Axel Filler, MD;  Location: [facility] OR;  Service: General;  Laterality: N/A;   LAPAROSCOPIC BILATERAL SALPINGO OOPHERECTOMY Bilateral 05/13/2014   Procedure: LAPAROSCOPIC BILATERAL SALPINGO OOPHORECTOMY;  Surgeon: Annamaria Boots, MD;  Location: [facility] ORS;  Service: Gynecology;  Laterality: Bilateral;   toe nail removal Left 03/2018   TONSILLECTOMY  age 62   WISDOM TOOTH EXTRACTION  age 31   XI ROBOTIC ASSISTED HIATAL HERNIA REPAIR N/A 12/28/2021   Procedure: XI ROBOTIC ASSISTED HIATAL HERNIA REPAIR WITH MESH AND FUNDOPLICATION;  Surgeon: Axel Filler, MD;  Location: [facility] OR;  Service: General;  Laterality: N/A;   Patient Active Problem List   Diagnosis Date Noted   Basal cell carcinoma (BCC) of dorsum of nose 02/04/2022   S/P Nissen fundoplication (without gastrostomy tube) procedure 12/28/2021   Gastroesophageal reflux disease with hiatal hernia 11/12/2021   S/P BSO (bilateral salpingo-oophorectomy) 08/12/2021   Hiatal hernia 08/12/2021   Primary osteoarthritis of both knees 07/25/2017   Screening for osteoporosis 07/25/2017   DDD (degenerative disc disease), lumbar 07/25/2017   Primary osteoarthritis of both hands 01/26/2017   Primary osteoarthritis of both feet 01/26/2017   Other idiopathic scoliosis, lumbar region 01/26/2017   Fibromyalgia 02/05/2015   Postmenopausal 08/07/2014   Enthesopathy of ankle and tarsus 08/14/2007   BUNIONS, BILATERAL 08/14/2007   UNEQUAL LEG LENGTH, ACQUIRED 08/14/2007   HEEL PAIN, LEFT 08/08/2007   Osteopenia 08/08/2007    PCP: Garlan Fillers, MD   REFERRING PROVIDER: Pollyann Savoy, MD   REFERRING DIAG:  Diagnosis  M22.41,M22.42 (ICD-10-CM) - Chondromalacia of both patellae  M17.0 (ICD-10-CM) - Primary osteoarthritis of both knees  M51.369 (ICD-10-CM) - DDD (degenerative disc disease), lumbar    Rationale for Evaluation and Treatment: Rehabilitation  THERAPY DIAG:  Pain, lumbar region  Pain in joint  of right knee  Chronic pain of left knee  ONSET DATE: 2+ years   SUBJECTIVE:                                                                                                                                                                                           SUBJECTIVE STATEMENT: Pt states she had a bad day yesterday and was not able to much of anything due to her pain flare. She feels it into her hands today.  Initial Subjective Pt has chronic pain from the neck down daily. Pt has history of fibro as well as history of compression fractures. Pt's knee pain did not  respond with any major changes to hyaluronic acid injections into the knee. Pt has had improvement in pain with "Pain with [facility]ria" at the [place] on Monday, Wed, and Fri. Pt does have upper neck and shoulder pain on the R. Pt notes that the knee has previously "given" out before with standing but has not happened recently. Pt has pain in multiple joint sites, especially into L hand/thumb. Pt does find that stress does effect her pain. Denies red flags.   Pt has had not had balance issues or falls.   PERTINENT HISTORY:  Previous L1 compressions fracture 2001  PAIN:  Are you having pain? Yes: NPRS scale: 5/10 Pain location: upper back/neck Pain description: varying- can be dull, ache, sharp Aggravating factors: slightly bent/flexed position at back Relieving factors: aquatic exercise, voltaren, heat, massage gun   PRECAUTIONS: None  RED FLAGS: None   WEIGHT BEARING RESTRICTIONS: No  FALLS:  Has patient fallen in last 6 months? 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  DIAGNOSTIC FINDINGS:  L knee xray  Impression: These findings are consistent with moderate chondromalacia  patella .   R knee xray Impression: These findings are consistent with mild osteoarthritis and  severe chondromalacia patella.   PATIENT SURVEYS:  FOTO 53 61 @ DC for knee  FOTO 53 60 @ DC for lumbar  SCREENING FOR RED FLAGS: Bowel or bladder incontinence: No Spinal tumors: No Cauda equina syndrome: No Compression fracture: No Abdominal aneurysm: No  COGNITION: Overall cognitive status: Within functional limits for tasks assessed     SENSATION: WFL  POSTURE:  rounded shoulders, decreased lumbar lordosis, and increased thoracic kyphosis  PALPATION: TTP of  L UT and bilateral glutes; pressure of HHD painful to pt into LE  LUMBAR ROM:   AROM eval  Flexion 90%  Extension 50%  Right lateral flexion 50%  Left lateral flexion 50%  Right rotation 75%  Left rotation 75%   (Blank rows = not tested)  LOWER EXTREMITY ROM:     Active  Right eval Left eval Left 12/5  Hip flexion     Hip extension     Hip abduction     Hip adduction     Hip internal rotation     Hip external rotation 50 40   Knee flexion 125 125   Knee extension 0 -5 0  Ankle dorsiflexion     Ankle plantarflexion     Ankle inversion     Ankle eversion      (Blank rows = not tested)  LOWER EXTREMITY MMT:    MMT Right eval Left eval Rt/Lt 12/5  Hip flexion 43.3 45.9   Hip extension     Hip abduction 38.1 33.3   Hip adduction     Hip internal rotation     Hip external rotation     Knee flexion     Knee extension 28.6 33.1 39.3/38.2  Ankle dorsiflexion     Ankle plantarflexion     Ankle inversion     Ankle eversion      (Blank rows = not tested)  LUMBAR SPECIAL TESTS:  Slump test: Negative  FUNCTIONAL TESTS:  5 times sit to stand: 10.9  GAIT: Distance walked: 76ft Assistive device utilized: None Level of assistance: Complete Independence Comments: mild flexed knee gait, no signficant deviations;   TODAY'S TREATMENT:     Treatment                            12/19:  Nustep Lvl 5 6 min  Shuttle leg press YTB at knees 75lbs 3x10 Shuttle leg press SL 25lbs 3x10  Tandem airex pad 30s 3x  YTB sidestepping at knees 4ft 2x laps   5lb LAQ 2x10 each  Treatment                            12/17:  Nustep Lvl 5 5 min  Shuttle leg press YTB at knees 75lbs 3x10 RTB row 2x10 RTB paloff 2x10 S/L clamshell 3s holds 2x10 each Bridge 2x10 YTB at knees Supine piriformis stretch 30s 3x                                                                                                             Treatment                            12/5:  Seated piriformis stretch Hooklying core engagement, + abd press into green tband Hooklying UE press into swiss ball Seated on swiss ball ab set, + bouncing Standing lat press into swiss ball Standing ragdoll  over swiss ball, head turned Lt and breathing into Rt ribcage     PATIENT EDUCATION:  Education details: aquatic HEP -issued laminated program.  Person educated: Patient Education method: Explanation, Demonstration, Tactile cues, Verbal cues, and Handouts Education comprehension: verbalized understanding, returned demonstration, verbal cues required, and tactile cues required  HOME EXERCISE PROGRAM: Land: [email or web address]  Access Code: [email or web address] URL: [email or web address] Date: 10/03/23 Prepared by: Geni Bers This aquatic home exercise program from [employer] utilizes pictures from land based exercises, but has been adapted prior to lamination and issuance.    ASSESSMENT:  CLINICAL IMPRESSION: Pt able to continue with exercise today for the LE with increase in volume and intensity of laoding. Pt becomes very fatigued in the quad and glutes with CKC exercise. Pt reports pain. However, description appears consistent with muscle fatigue and muscle exertion related burning. Plan to updated HEP at next if no adverse response to today's session. Pt is making steady progress with therapy exercise for the LE.  Pt would benefit from continued skilled therapy in order to reach goals and maximize functional LE and trunk strength for prevention of further functional decline.   Initial Impression Patient is a 67 y.o. female who was seen today for physical therapy evaluation and treatment for c/c of LBP and knee pain. Pt's s/s appear consistent with mechanical LBP from history of fracture and surgery as well as significant knee OA. Pt's pain is moderately sensitive and irritable with movement. Pt's is more  strength limited at this time. Plan to continue with developing aquatic HEP and land based strengthening at future sessions for knees and "core."  Pt would benefit from continued skilled therapy in order to reach goals and maximize functional LE and trunk strength for prevention of further functional decline.   OBJECTIVE IMPAIRMENTS: Abnormal gait, decreased activity tolerance, decreased balance, decreased endurance, decreased knowledge of use of DME, decreased mobility, difficulty walking, decreased ROM, decreased strength, hypomobility, impaired flexibility, improper body mechanics, postural dysfunction, and pain.    ACTIVITY LIMITATIONS: carrying, lifting, bending, standing, squatting, stairs, transfers, bed mobility, and locomotion level   PARTICIPATION LIMITATIONS: meal prep, cleaning, laundry, driving, shopping, and community activity and exercise   PERSONAL FACTORS: Age, Fitness, Time since onset of injury/illness/exacerbation, and 2+ co morbidities:    are also affecting patient's functional outcome.    REHAB POTENTIAL: Fair     CLINICAL DECISION MAKING: Evolving/moderate complexity   EVALUATION COMPLEXITY: Moderate     GOALS:     SHORT TERM GOALS: Target date: 10/25/2023      Pt will become independent with HEP in order to demonstrate synthesis of PT education.    Goal status: INITIAL   2.  Pt will report at least 2 pt reduction on NPRS scale for pain in order to demonstrate functional improvement with household activity, self care, and ADL.    Goal status: INITIAL   3.  Pt will be able to demonstrate/report ability to sit/stand/sleep for extended periods of time without pain in order to demonstrate functional improvement and tolerance to static positioning.    Goal status: INITIAL       LONG TERM GOALS: Target date: 12/06/2023      Pt  will become independent with final HEP in order to demonstrate synthesis of PT education.    Goal status: INITIAL   Pt will be  able to demonstrate at least a 10lb increase in HHD testing for the LE in order to demonstrate improvement in LE strength for daily mobility.  Goal status: INITIAL   3.  Pt will be able to demonstrate/report ability to sit/stand/sleep for extended periods of time without pain in order to demonstrate functional improvement and tolerance to static positioning.     Goal status: INITIAL   4.  Pt will score >/= 60 and 61 on FOTO to demonstrate improvement in perceived lumbar  and knee function.    Goal status: INITIAL   5. Pt will be able to lift/squat/hold >25 lbs in order to demonstrate functional improvement in lumbopelvic strength for return ADL and exercise.  Goal status: INITIAL     PLAN:   PT FREQUENCY: 1-2x/week   PT DURATION: 12 weeks   PLANNED INTERVENTIONS: Therapeutic exercises, Therapeutic activity, Neuromuscular re-education, Balance training, Gait training, Patient/Family education, Self Care, Joint mobilization, Joint manipulation, Stair training, Prosthetic training, DME instructions, Aquatic Therapy, Dry Needling, Electrical stimulation, Spinal manipulation, Spinal mobilization, Moist heat, scar mobilization, Splintting, Taping, Vasopneumatic device, Traction, Ultrasound, Ionotophoresis 4mg /ml Dexamethasone, Manual therapy, and Re-evaluation   PLAN FOR NEXT SESSION:  Land based strengthening for lumbopelvic  and knee strength  Zebedee Iba PT, DPT 10/26/23 2:00 PM

## 2023-10-31 ENCOUNTER — Ambulatory Visit (HOSPITAL_BASED_OUTPATIENT_CLINIC_OR_DEPARTMENT_OTHER): Payer: Medicare Other | Admitting: Physical Therapy

## 2023-11-02 ENCOUNTER — Encounter (HOSPITAL_BASED_OUTPATIENT_CLINIC_OR_DEPARTMENT_OTHER): Payer: Self-pay | Admitting: Physical Therapy

## 2023-11-02 ENCOUNTER — Ambulatory Visit (HOSPITAL_BASED_OUTPATIENT_CLINIC_OR_DEPARTMENT_OTHER): Payer: Medicare Other | Admitting: Physical Therapy

## 2023-11-02 DIAGNOSIS — G8929 Other chronic pain: Secondary | ICD-10-CM | POA: Diagnosis not present

## 2023-11-02 DIAGNOSIS — M25561 Pain in right knee: Secondary | ICD-10-CM | POA: Diagnosis not present

## 2023-11-02 DIAGNOSIS — M545 Low back pain, unspecified: Secondary | ICD-10-CM

## 2023-11-02 DIAGNOSIS — M25562 Pain in left knee: Secondary | ICD-10-CM | POA: Diagnosis not present

## 2023-11-02 NOTE — Therapy (Signed)
OUTPATIENT PHYSICAL THERAPY THORACOLUMBAR TREATMENT   Patient Name: Courtney Waters MRN: [id number] DOB:[birth year], 67 y.o., female Today's Date: 11/02/2023  END OF SESSION:  PT End of Session - 11/02/23 1158     Visit Number 8    Number of Visits 18    Date for PT Re-Evaluation 12/12/23    Authorization Type MCR    PT Start Time 1145    PT Stop Time 1224    PT Time Calculation (min) 39 min    Activity Tolerance Patient tolerated treatment well    Behavior During Therapy [facility] for tasks assessed/performed                 Past Medical History:  Diagnosis Date   Acid reflux    Arthritis    hands and neck   Back pain 12/01/1998   compression fracture L1 from sledding accident   Bladder infection    Depression    Fibromyalgia    Hearing loss in right ear    Heart murmur    Hiatal hernia 11/2017   MVP (mitral valve prolapse)    Hx   SVD (spontaneous vaginal delivery)    x 2   Past Surgical History:  Procedure Laterality Date   BASAL CELL CARCINOMA EXCISION  12/2021   nose   BUNIONECTOMY Left 10/16/2014   Dr. 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   EYE SURGERY Bilateral 2022   cataract surgery L eye 09/23/21 R eye 10/07/21   INSERTION OF MESH N/A 12/28/2021   Procedure: INSERTION OF MESH;  Surgeon: Axel Filler, MD;  Location: [facility] OR;  Service: General;  Laterality: N/A;   LAPAROSCOPIC BILATERAL SALPINGO OOPHERECTOMY Bilateral 05/13/2014   Procedure: LAPAROSCOPIC BILATERAL SALPINGO OOPHORECTOMY;  Surgeon: Annamaria Boots, MD;  Location: [facility] ORS;  Service: Gynecology;  Laterality: Bilateral;   toe nail removal Left 03/2018   TONSILLECTOMY  age 46   WISDOM TOOTH EXTRACTION  age 85   XI ROBOTIC ASSISTED HIATAL HERNIA REPAIR N/A 12/28/2021   Procedure: XI ROBOTIC ASSISTED HIATAL HERNIA REPAIR WITH MESH AND FUNDOPLICATION;  Surgeon: Axel Filler, MD;  Location: [facility] OR;  Service: General;  Laterality: N/A;   Patient Active Problem List   Diagnosis Date Noted   Basal cell carcinoma (BCC) of dorsum of nose 02/04/2022   S/P Nissen fundoplication (without gastrostomy tube) procedure 12/28/2021   Gastroesophageal reflux disease with hiatal hernia 11/12/2021   S/P BSO (bilateral salpingo-oophorectomy) 08/12/2021   Hiatal hernia 08/12/2021   Primary osteoarthritis of both knees 07/25/2017   Screening for osteoporosis 07/25/2017   DDD (degenerative disc disease), lumbar 07/25/2017   Primary osteoarthritis of both hands 01/26/2017   Primary osteoarthritis of both feet 01/26/2017   Other idiopathic scoliosis, lumbar region 01/26/2017   Fibromyalgia 02/05/2015   Postmenopausal 08/07/2014   Enthesopathy of ankle and tarsus 08/14/2007   BUNIONS, BILATERAL 08/14/2007   UNEQUAL LEG LENGTH, ACQUIRED 08/14/2007   HEEL PAIN, LEFT 08/08/2007   Osteopenia 08/08/2007    PCP: Garlan Fillers, MD   REFERRING PROVIDER: Pollyann Savoy, MD   REFERRING DIAG:  Diagnosis  M22.41,M22.42 (ICD-10-CM) - Chondromalacia of both patellae  M17.0 (ICD-10-CM) - Primary osteoarthritis of both knees  M51.369 (ICD-10-CM) - DDD (degenerative disc disease), lumbar    Rationale for Evaluation and Treatment: Rehabilitation  THERAPY DIAG:  Pain, lumbar region  Pain in  joint of right knee  Chronic pain of left knee  ONSET DATE: 2+ years   SUBJECTIVE:                                                                                                                                                                                           SUBJECTIVE STATEMENT:  Pt states she had more of a pain flare today that is more elevated. She had quad soreness after last session but it improve after a day. She feels "dried out" today.   Initial Subjective Pt has chronic pain from the neck down daily. Pt has history of fibro as well as history of  compression fractures. Pt's knee pain did not respond with any major changes to hyaluronic acid injections into the knee. Pt has had improvement in pain with "Pain with [facility]" at the [place] on Monday, Wed, and Fri. Pt does have upper neck and shoulder pain on the R. Pt notes that the knee has previously "given" out before with standing but has not happened recently. Pt has pain in multiple joint sites, especially into L hand/thumb. Pt does find that stress does effect her pain. Denies red flags.   Pt has had not had balance issues or falls.   PERTINENT HISTORY:  Previous L1 compressions fracture 2001  PAIN:  Are you having pain? Yes: NPRS scale: 5/10 Pain location: upper back/neck Pain description: varying- can be dull, ache, sharp Aggravating factors: slightly bent/flexed position at back Relieving factors: aquatic exercise, voltaren, heat, massage gun   PRECAUTIONS: None  RED FLAGS: None   WEIGHT BEARING RESTRICTIONS: No  FALLS:  Has patient fallen in last 6 months? 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  DIAGNOSTIC FINDINGS:  L knee xray  Impression: These findings are consistent with moderate chondromalacia  patella .   R knee xray Impression: These findings are consistent with mild osteoarthritis and  severe chondromalacia patella.   PATIENT SURVEYS:  FOTO 53 61 @ DC for knee  FOTO 53 60 @ DC for lumbar  SCREENING FOR RED FLAGS: Bowel or bladder incontinence: No Spinal tumors: No Cauda equina syndrome: No Compression fracture: No Abdominal aneurysm: No  COGNITION: Overall cognitive status: Within functional limits for tasks  assessed     SENSATION: WFL  POSTURE: rounded shoulders, decreased lumbar lordosis, and  increased thoracic kyphosis  PALPATION: TTP of L UT and bilateral glutes; pressure of HHD painful to pt into LE  LUMBAR ROM:   AROM eval  Flexion 90%  Extension 50%  Right lateral flexion 50%  Left lateral flexion 50%  Right rotation 75%  Left rotation 75%   (Blank rows = not tested)  LOWER EXTREMITY ROM:     Active  Right eval Left eval Left 12/5  Hip flexion     Hip extension     Hip abduction     Hip adduction     Hip internal rotation     Hip external rotation 50 40   Knee flexion 125 125   Knee extension 0 -5 0  Ankle dorsiflexion     Ankle plantarflexion     Ankle inversion     Ankle eversion      (Blank rows = not tested)  LOWER EXTREMITY MMT:    MMT Right eval Left eval Rt/Lt 12/5  Hip flexion 43.3 45.9   Hip extension     Hip abduction 38.1 33.3   Hip adduction     Hip internal rotation     Hip external rotation     Knee flexion     Knee extension 28.6 33.1 39.3/38.2  Ankle dorsiflexion     Ankle plantarflexion     Ankle inversion     Ankle eversion      (Blank rows = not tested)  LUMBAR SPECIAL TESTS:  Slump test: Negative  FUNCTIONAL TESTS:  5 times sit to stand: 10.9  GAIT: Distance walked: 93ft Assistive device utilized: None Level of assistance: Complete Independence Comments: mild flexed knee gait, no signficant deviations;   TODAY'S TREATMENT:    Treatment                            12/26:  Nustep Lvl 5 6 min  Shuttle leg press  without band today 75lbs 3x10 Toe walk 3x laps in hallway 5lbs farmer carry 67ft 3x laps (focus on upright posture and "stacking) Use of paper tape or k-tape discussed for external cuing of posture as pt asks about shoulder bracing for posture 2" box step down 2x10 Tandem airex pad 30s 3x  YTB sidestepping at knees 23ft 2x laps   Treatment                            12/19:  Nustep Lvl 5 6 min  Shuttle  leg press YTB at knees 75lbs 3x10 Shuttle leg press SL 25lbs 3x10  Tandem airex pad 30s 3x  YTB sidestepping at knees 97ft 2x laps   5lb LAQ 2x10 each  Treatment                            12/17:  Nustep Lvl 5 5 min  Shuttle leg press YTB at knees 75lbs 3x10 RTB row 2x10 RTB paloff 2x10 S/L clamshell 3s holds 2x10 each Bridge 2x10 YTB at knees Supine piriformis stretch 30s 3x  Treatment                            12/5:  Seated piriformis stretch Hooklying core engagement, + abd press into green tband Hooklying UE press into swiss ball Seated on swiss ball ab set, + bouncing Standing lat press into swiss ball Standing ragdoll over swiss ball, head turned Lt and breathing into Rt ribcage     PATIENT EDUCATION:  Education details: anatomy, exercise progression, DOMS expectations, muscle firing,  HEP, POC Person educated: Patient Education method: Explanation, Demonstration, Tactile cues, Verbal cues, and Handouts Education comprehension: verbalized understanding, returned demonstration, verbal cues required, and tactile cues required  HOME EXERCISE PROGRAM: Land: [email or web address]  Access Code: [email or web address] URL: [email or web address] Date: 10/03/23 Prepared by: Geni Bers This aquatic home exercise program from [employer] utilizes pictures from land based exercises, but has been adapted prior to lamination and issuance.    ASSESSMENT:  CLINICAL IMPRESSION: Pt able to continue with steady progression of lumbopelvic and knee strengthening exercise. Pt able to start funcitonal carry and dynamic balance activity today. Mild increase in pain noted with step down but returns to baseline with rest. HEP updated today. Plan to progress "core" strength exercise in HEP at next and more SL stability. Pt would benefit from continued skilled therapy in order to reach goals and  maximize functional LE and trunk strength for prevention of further functional decline.   Initial Impression Patient is a 67 y.o. female who was seen today for physical therapy evaluation and treatment for c/c of LBP and knee pain. Pt's s/s appear consistent with mechanical LBP from history of fracture and surgery as well as significant knee OA. Pt's pain is moderately sensitive and irritable with movement. Pt's is more strength limited at this time. Plan to continue with developing aquatic HEP and land based strengthening at future sessions for knees and "core."  Pt would benefit from continued skilled therapy in order to reach goals and maximize functional LE and trunk strength for prevention of further functional decline.   OBJECTIVE IMPAIRMENTS: Abnormal gait, decreased activity tolerance, decreased balance, decreased endurance, decreased knowledge of use of DME, decreased mobility, difficulty walking, decreased ROM, decreased strength, hypomobility, impaired flexibility, improper body mechanics, postural dysfunction, and pain.    ACTIVITY LIMITATIONS: carrying, lifting, bending, standing, squatting, stairs, transfers, bed mobility, and locomotion level   PARTICIPATION LIMITATIONS: meal prep, cleaning, laundry, driving, shopping, and community activity and exercise   PERSONAL FACTORS: Age, Fitness, Time since onset of injury/illness/exacerbation, and 2+ co morbidities:    are also affecting patient's functional outcome.    REHAB POTENTIAL: Fair     CLINICAL DECISION MAKING: Evolving/moderate complexity   EVALUATION COMPLEXITY: Moderate     GOALS:     SHORT TERM GOALS: Target date: 10/25/2023      Pt will become independent with HEP in order to demonstrate synthesis of PT education.    Goal status: INITIAL   2.  Pt will report at least 2 pt reduction on NPRS scale for pain in order to demonstrate functional improvement with household activity, self care, and ADL.    Goal status:  INITIAL   3.  Pt will be able to demonstrate/report ability to sit/stand/sleep for extended periods of time without pain in order to demonstrate functional improvement and tolerance to static positioning.    Goal status: INITIAL       LONG TERM GOALS: Target date: 12/06/2023      Pt  will become independent with final HEP in order to demonstrate synthesis of PT education.    Goal status: INITIAL   Pt will be able to demonstrate at least a 10lb increase in HHD testing for the LE in order to demonstrate improvement in LE strength for daily mobility.    Goal status: INITIAL   3.  Pt will be able to demonstrate/report ability to sit/stand/sleep for extended periods of time without pain in order to demonstrate functional improvement and tolerance to static positioning.     Goal status: INITIAL   4.  Pt will score >/= 60 and 61 on FOTO to demonstrate improvement in perceived lumbar  and knee function.    Goal status: INITIAL   5. Pt will be able to lift/squat/hold >25 lbs in order to demonstrate functional improvement in lumbopelvic strength for return ADL and exercise.  Goal status: INITIAL     PLAN:   PT FREQUENCY: 1-2x/week   PT DURATION: 12 weeks   PLANNED INTERVENTIONS: Therapeutic exercises, Therapeutic activity, Neuromuscular re-education, Balance training, Gait training, Patient/Family education, Self Care, Joint mobilization, Joint manipulation, Stair training, Prosthetic training, DME instructions, Aquatic Therapy, Dry Needling, Electrical stimulation, Spinal manipulation, Spinal mobilization, Moist heat, scar mobilization, Splintting, Taping, Vasopneumatic device, Traction, Ultrasound, Ionotophoresis 4mg /ml Dexamethasone, Manual therapy, and Re-evaluation   PLAN FOR NEXT SESSION:  Land based strengthening for lumbopelvic  and knee strength  Zebedee Iba PT, DPT 11/02/23 12:49 PM

## 2023-11-09 ENCOUNTER — Ambulatory Visit: Payer: Medicare Other | Admitting: Rheumatology

## 2023-11-10 ENCOUNTER — Encounter (HOSPITAL_BASED_OUTPATIENT_CLINIC_OR_DEPARTMENT_OTHER): Payer: Self-pay | Admitting: Physical Therapy

## 2023-11-10 ENCOUNTER — Ambulatory Visit (HOSPITAL_BASED_OUTPATIENT_CLINIC_OR_DEPARTMENT_OTHER): Payer: Medicare Other | Attending: Rheumatology | Admitting: Physical Therapy

## 2023-11-10 DIAGNOSIS — M25561 Pain in right knee: Secondary | ICD-10-CM | POA: Insufficient documentation

## 2023-11-10 DIAGNOSIS — M25562 Pain in left knee: Secondary | ICD-10-CM | POA: Insufficient documentation

## 2023-11-10 DIAGNOSIS — G8929 Other chronic pain: Secondary | ICD-10-CM | POA: Insufficient documentation

## 2023-11-10 DIAGNOSIS — M545 Low back pain, unspecified: Secondary | ICD-10-CM | POA: Insufficient documentation

## 2023-11-10 NOTE — Therapy (Signed)
 OUTPATIENT PHYSICAL THERAPY THORACOLUMBAR TREATMENT   Patient Name: Courtney Waters MRN: [id number] DOB:[birth year], 68 y.o., female Today's Date: 11/10/2023  END OF SESSION:  PT End of Session - 11/10/23 1348     Visit Number 9    Number of Visits 18    Date for PT Re-Evaluation 12/12/23    Authorization Type MCR    PT Start Time 1348    PT Stop Time 1428    PT Time Calculation (min) 40 min    Activity Tolerance Patient tolerated treatment well    Behavior During Therapy [facility] for tasks assessed/performed                 Past Medical History:  Diagnosis Date   Acid reflux    Arthritis    hands and neck   Back pain 12/01/1998   compression fracture L1 from sledding accident   Bladder infection    Depression    Fibromyalgia    Hearing loss in right ear    Heart murmur    Hiatal hernia 11/2017   MVP (mitral valve prolapse)    Hx   SVD (spontaneous vaginal delivery)    x 2   Past Surgical History:  Procedure Laterality Date   BASAL CELL CARCINOMA EXCISION  12/2021   nose   BUNIONECTOMY Left 10/16/2014   Dr. 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   EYE SURGERY Bilateral 2022   cataract surgery L eye 09/23/21 R eye 10/07/21   INSERTION OF MESH N/A 12/28/2021   Procedure: INSERTION OF MESH;  Surgeon: Rubin Calamity, MD;  Location: [facility] OR;  Service: General;  Laterality: N/A;   LAPAROSCOPIC BILATERAL SALPINGO OOPHERECTOMY Bilateral 05/13/2014   Procedure: LAPAROSCOPIC BILATERAL SALPINGO OOPHORECTOMY;  Surgeon: Ronal Elvie Pinal, MD;  Location: [facility] ORS;  Service: Gynecology;  Laterality: Bilateral;   toe nail removal Left 03/2018   TONSILLECTOMY  age 50   WISDOM TOOTH EXTRACTION  age 68   XI ROBOTIC ASSISTED HIATAL HERNIA REPAIR N/A 12/28/2021   Procedure: XI ROBOTIC ASSISTED HIATAL HERNIA REPAIR WITH MESH AND FUNDOPLICATION;  Surgeon: Rubin Calamity, MD;  Location: [facility] OR;  Service: General;  Laterality: N/A;   Patient Active Problem List   Diagnosis Date Noted   Basal cell carcinoma (BCC) of dorsum of nose 02/04/2022   S/P Nissen fundoplication (without gastrostomy tube) procedure 12/28/2021   Gastroesophageal reflux disease with hiatal hernia 11/12/2021   S/P BSO (bilateral salpingo-oophorectomy) 08/12/2021   Hiatal hernia 08/12/2021   Primary osteoarthritis of both knees 07/25/2017   Screening for osteoporosis 07/25/2017   DDD (degenerative disc disease), lumbar 07/25/2017   Primary osteoarthritis of both hands 01/26/2017   Primary osteoarthritis of both feet 01/26/2017   Other idiopathic scoliosis, lumbar region 01/26/2017   Fibromyalgia 02/05/2015   Postmenopausal 08/07/2014   Enthesopathy of ankle and tarsus 08/14/2007   BUNIONS, BILATERAL 08/14/2007   UNEQUAL LEG LENGTH, ACQUIRED 08/14/2007   HEEL PAIN, LEFT 08/08/2007   Osteopenia 08/08/2007    PCP: Yolande Toribio MATSU, MD   REFERRING PROVIDER: Dolphus Reiter, MD   REFERRING DIAG:  Diagnosis  M22.41,M22.42 (ICD-10-CM) - Chondromalacia of both patellae  M17.0 (ICD-10-CM) - Primary osteoarthritis of both knees  M51.369 (ICD-10-CM) - DDD (degenerative disc disease), lumbar    Rationale for Evaluation and Treatment: Rehabilitation  THERAPY DIAG:  Pain, lumbar region  Pain in  joint of right knee  Chronic pain of left knee  ONSET DATE: 2+ years   SUBJECTIVE:                                                                                                                                                                                           SUBJECTIVE STATEMENT:  Pt states she has been doing pretty good. Doing HEP. Joints and muscles have been doing better but back was bothering her more today on L.   Initial Subjective Pt has chronic pain from the neck down daily. Pt has history of fibro as well as history of compression fractures. Pt's knee  pain did not respond with any major changes to hyaluronic acid injections into the knee. Pt has had improvement in pain with Pain with [facility] at the [facility] on Monday, Wed, and Fri. Pt does have upper neck and shoulder pain on the R. Pt notes that the knee has previously given out before with standing but has not happened recently. Pt has pain in multiple joint sites, especially into L hand/thumb. Pt does find that stress does effect her pain. Denies red flags.   Pt has had not had balance issues or falls.   PERTINENT HISTORY:  Previous L1 compressions fracture 2001  PAIN:  Are you having pain? Yes: NPRS scale: 5/10 Pain location: upper back/neck Pain description: varying- can be dull, ache, sharp Aggravating factors: slightly bent/flexed position at back Relieving factors: aquatic exercise, voltaren, heat, massage gun   PRECAUTIONS: None  RED FLAGS: None   WEIGHT BEARING RESTRICTIONS: No  FALLS:  Has patient fallen in last 6 months? 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  DIAGNOSTIC FINDINGS:  L knee xray  Impression: These findings are consistent with moderate chondromalacia  patella .   R knee xray Impression: These findings are consistent with mild osteoarthritis and  severe chondromalacia patella.   PATIENT SURVEYS:  FOTO 53 61 @ DC for knee  FOTO 53 60 @ DC for lumbar  SCREENING FOR RED FLAGS: Bowel or bladder incontinence: No Spinal tumors: No Cauda equina syndrome: No Compression fracture: No Abdominal aneurysm: No  COGNITION: Overall cognitive status: Within functional limits for tasks  assessed     SENSATION: WFL  POSTURE: rounded shoulders, decreased lumbar lordosis, and increased thoracic kyphosis  PALPATION: TTP  of L UT and bilateral glutes; pressure of HHD painful to pt into LE  LUMBAR ROM:   AROM eval  Flexion 90%  Extension 50%  Right lateral flexion 50%  Left lateral flexion 50%  Right rotation 75%  Left rotation 75%   (Blank rows = not tested)  LOWER EXTREMITY ROM:     Active  Right eval Left eval Left 12/5  Hip flexion     Hip extension     Hip abduction     Hip adduction     Hip internal rotation     Hip external rotation 50 40   Knee flexion 125 125   Knee extension 0 -5 0  Ankle dorsiflexion     Ankle plantarflexion     Ankle inversion     Ankle eversion      (Blank rows = not tested)  LOWER EXTREMITY MMT:    MMT Right eval Left eval Rt/Lt 12/5  Hip flexion 43.3 45.9   Hip extension     Hip abduction 38.1 33.3   Hip adduction     Hip internal rotation     Hip external rotation     Knee flexion     Knee extension 28.6 33.1 39.3/38.2  Ankle dorsiflexion     Ankle plantarflexion     Ankle inversion     Ankle eversion      (Blank rows = not tested)  LUMBAR SPECIAL TESTS:  Slump test: Negative  FUNCTIONAL TESTS:  5 times sit to stand: 10.9  GAIT: Distance walked: 40ft Assistive device utilized: None Level of assistance: Complete Independence Comments: mild flexed knee gait, no signficant deviations;   TODAY'S TREATMENT:   11/10/23 Nustep Lvl 5 6 min Shuttle leg press  without band today 75lbs 3x10 Lateral step down 4 inch 2 x 10 bilateral  Tandem airex pad 30s 3x  5lbs farmer carry 36ft 2x laps  Lift/chops BTB 1 x 5 bilateral each SLS 2 x 30 second holds bilateral  Treatment                            12/26:  Nustep Lvl 5 6 min  Shuttle leg press  without band today 75lbs 3x10 Toe walk 3x laps in hallway 5lbs farmer carry 56ft 3x laps (focus on upright posture and stacking) Use of paper tape or k-tape  discussed for external cuing of posture as pt asks about shoulder bracing for posture 2 box step down 2x10 Tandem airex pad 30s 3x  YTB sidestepping at knees 36ft 2x laps   Treatment                            12/19:  Nustep Lvl 5 6 min  Shuttle leg press YTB at knees 75lbs 3x10 Shuttle leg press SL 25lbs 3x10  Tandem airex pad 30s 3x  YTB sidestepping at knees 75ft 2x laps   5lb LAQ 2x10 each  Treatment                            12/17:  Nustep Lvl 5 5 min  Shuttle leg press YTB at knees 75lbs 3x10 RTB row 2x10 RTB paloff 2x10 S/L clamshell 3s holds 2x10 each Bridge 2x10 YTB at knees Supine piriformis stretch 30s 3x  Treatment                            12/5:  Seated piriformis stretch Hooklying core engagement, + abd press into green tband Hooklying UE press into swiss ball Seated on swiss ball ab set, + bouncing Standing lat press into swiss ball Standing ragdoll over swiss ball, head turned Lt and breathing into Rt ribcage     PATIENT EDUCATION:  Education details: anatomy, exercise progression, DOMS expectations, muscle firing,  HEP, POC Person educated: Patient Education method: Explanation, Demonstration, Tactile cues, Verbal cues, and Handouts Education comprehension: verbalized understanding, returned demonstration, verbal cues required, and tactile cues required  HOME EXERCISE PROGRAM: Land: [email or web address]  Access Code: [email or web address] URL: [email or web address] Date: 10/03/23 Prepared by: Frankie Ziemba This aquatic home exercise program from [employer] utilizes pictures from land based exercises, but has been adapted prior to lamination and issuance.    ASSESSMENT:  CLINICAL IMPRESSION: Patient progressing well with LE and core strengthening. Intermittent cueing for mechanics with good carry over. Patient will continue to benefit from physical  therapy in order to improve function and reduce impairment.   Initial Impression Patient is a 68 y.o. female who was seen today for physical therapy evaluation and treatment for c/c of LBP and knee pain. Pt's s/s appear consistent with mechanical LBP from history of fracture and surgery as well as significant knee OA. Pt's pain is moderately sensitive and irritable with movement. Pt's is more strength limited at this time. Plan to continue with developing aquatic HEP and land based strengthening at future sessions for knees and core.  Pt would benefit from continued skilled therapy in order to reach goals and maximize functional LE and trunk strength for prevention of further functional decline.   OBJECTIVE IMPAIRMENTS: Abnormal gait, decreased activity tolerance, decreased balance, decreased endurance, decreased knowledge of use of DME, decreased mobility, difficulty walking, decreased ROM, decreased strength, hypomobility, impaired flexibility, improper body mechanics, postural dysfunction, and pain.    ACTIVITY LIMITATIONS: carrying, lifting, bending, standing, squatting, stairs, transfers, bed mobility, and locomotion level   PARTICIPATION LIMITATIONS: meal prep, cleaning, laundry, driving, shopping, and community activity and exercise   PERSONAL FACTORS: Age, Fitness, Time since onset of injury/illness/exacerbation, and 2+ co morbidities:    are also affecting patient's functional outcome.    REHAB POTENTIAL: Fair     CLINICAL DECISION MAKING: Evolving/moderate complexity   EVALUATION COMPLEXITY: Moderate     GOALS:     SHORT TERM GOALS: Target date: 10/25/2023      Pt will become independent with HEP in order to demonstrate synthesis of PT education.    Goal status: INITIAL   2.  Pt will report at least 2 pt reduction on NPRS scale for pain in order to demonstrate functional improvement with household activity, self care, and ADL.    Goal status: INITIAL   3.  Pt will be  able to demonstrate/report ability to sit/stand/sleep for extended periods of time without pain in order to demonstrate functional improvement and tolerance to static positioning.    Goal status: INITIAL       LONG TERM GOALS: Target date: 12/06/2023      Pt  will become independent with final HEP in order to demonstrate synthesis of PT education.    Goal status: INITIAL   Pt will be able to demonstrate at least a 10lb increase in HHD testing for the LE in order to demonstrate improvement in LE strength  for daily mobility.    Goal status: INITIAL   3.  Pt will be able to demonstrate/report ability to sit/stand/sleep for extended periods of time without pain in order to demonstrate functional improvement and tolerance to static positioning.     Goal status: INITIAL   4.  Pt will score >/= 60 and 61 on FOTO to demonstrate improvement in perceived lumbar  and knee function.    Goal status: INITIAL   5. Pt will be able to lift/squat/hold >25 lbs in order to demonstrate functional improvement in lumbopelvic strength for return ADL and exercise.  Goal status: INITIAL     PLAN:   PT FREQUENCY: 1-2x/week   PT DURATION: 12 weeks   PLANNED INTERVENTIONS: Therapeutic exercises, Therapeutic activity, Neuromuscular re-education, Balance training, Gait training, Patient/Family education, Self Care, Joint mobilization, Joint manipulation, Stair training, Prosthetic training, DME instructions, Aquatic Therapy, Dry Needling, Electrical stimulation, Spinal manipulation, Spinal mobilization, Moist heat, scar mobilization, Splintting, Taping, Vasopneumatic device, Traction, Ultrasound, Ionotophoresis 4mg /ml Dexamethasone , Manual therapy, and Re-evaluation   PLAN FOR NEXT SESSION:  Land based strengthening for lumbopelvic  and knee strength   Prentice RAMAN Meiko Ives, PT 11/10/2023, 2:30 PM

## 2023-11-14 ENCOUNTER — Encounter (HOSPITAL_BASED_OUTPATIENT_CLINIC_OR_DEPARTMENT_OTHER): Payer: Self-pay | Admitting: Physical Therapy

## 2023-11-14 ENCOUNTER — Ambulatory Visit (HOSPITAL_BASED_OUTPATIENT_CLINIC_OR_DEPARTMENT_OTHER): Payer: Medicare Other | Admitting: Physical Therapy

## 2023-11-14 DIAGNOSIS — M545 Low back pain, unspecified: Secondary | ICD-10-CM

## 2023-11-14 DIAGNOSIS — G8929 Other chronic pain: Secondary | ICD-10-CM | POA: Diagnosis not present

## 2023-11-14 DIAGNOSIS — M25561 Pain in right knee: Secondary | ICD-10-CM

## 2023-11-14 DIAGNOSIS — M25562 Pain in left knee: Secondary | ICD-10-CM | POA: Diagnosis not present

## 2023-11-14 NOTE — Therapy (Signed)
 OUTPATIENT PHYSICAL THERAPY THORACOLUMBAR TREATMENT  Progress Note Reporting Period 09/13/23 to 11/13/22  See note below for Objective Data and Assessment of Progress/Goals.      Patient Name: Courtney Waters MRN: [id number] DOB:May 15, 1956, 68 y.o., female Today's Date: 11/14/2023  END OF SESSION:  PT End of Session - 11/14/23 1306     Visit Number 10    Number of Visits 18    Date for PT Re-Evaluation 12/12/23    Authorization Type MCR    PT Start Time 1315    PT Stop Time 1400    PT Time Calculation (min) 45 min    Activity Tolerance Patient tolerated treatment well    Behavior During Therapy [facility] for tasks assessed/performed                  Past Medical History:  Diagnosis Date   Acid reflux    Arthritis    hands and neck   Back pain 12/01/1998   compression fracture L1 from sledding accident   Bladder infection    Depression    Fibromyalgia    Hearing loss in right ear    Heart murmur    Hiatal hernia 11/2017   MVP (mitral valve prolapse)    Hx   SVD (spontaneous vaginal delivery)    x 2   Past Surgical History:  Procedure Laterality Date   BASAL CELL CARCINOMA EXCISION  12/2021   nose   BUNIONECTOMY Left 10/16/2014   Dr. 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   EYE SURGERY Bilateral 2022   cataract surgery L eye 09/23/21 R eye 10/07/21   INSERTION OF MESH N/A 12/28/2021   Procedure: INSERTION OF MESH;  Surgeon: Rubin Calamity, MD;  Location: [facility] OR;  Service: General;  Laterality: N/A;   LAPAROSCOPIC BILATERAL SALPINGO OOPHERECTOMY Bilateral 05/13/2014   Procedure: LAPAROSCOPIC BILATERAL SALPINGO OOPHORECTOMY;  Surgeon: Ronal Elvie Pinal, MD;  Location: [facility] ORS;  Service: Gynecology;  Laterality: Bilateral;   toe nail removal Left 03/2018   TONSILLECTOMY  age 55   WISDOM TOOTH EXTRACTION  age 48   XI ROBOTIC ASSISTED HIATAL HERNIA  REPAIR N/A 12/28/2021   Procedure: XI ROBOTIC ASSISTED HIATAL HERNIA REPAIR WITH MESH AND FUNDOPLICATION;  Surgeon: Rubin Calamity, MD;  Location: [facility] OR;  Service: General;  Laterality: N/A;   Patient Active Problem List   Diagnosis Date Noted   Basal cell carcinoma (BCC) of dorsum of nose 02/04/2022   S/P Nissen fundoplication (without gastrostomy tube) procedure 12/28/2021   Gastroesophageal reflux disease with hiatal hernia 11/12/2021   S/P BSO (bilateral salpingo-oophorectomy) 08/12/2021   Hiatal hernia 08/12/2021   Primary osteoarthritis of both knees 07/25/2017   Screening for osteoporosis 07/25/2017   DDD (degenerative disc disease), lumbar 07/25/2017   Primary osteoarthritis of both hands 01/26/2017   Primary osteoarthritis of both feet 01/26/2017   Other idiopathic scoliosis, lumbar region 01/26/2017   Fibromyalgia 02/05/2015   Postmenopausal 08/07/2014   Enthesopathy of ankle and tarsus 08/14/2007   BUNIONS, BILATERAL 08/14/2007   UNEQUAL LEG LENGTH, ACQUIRED 08/14/2007   HEEL PAIN, LEFT 08/08/2007   Osteopenia 08/08/2007    PCP: Yolande Toribio MATSU, MD   REFERRING PROVIDER: Dolphus Reiter, MD   REFERRING DIAG:  Diagnosis  M22.41,M22.42 (ICD-10-CM) - Chondromalacia of both patellae  M17.0 (ICD-10-CM) - Primary osteoarthritis of both knees  M51.369 (ICD-10-CM) - DDD (  degenerative disc disease), lumbar    Rationale for Evaluation and Treatment: Rehabilitation  THERAPY DIAG:  Pain, lumbar region  Pain in joint of right knee  Chronic pain of left knee  ONSET DATE: 2+ years   SUBJECTIVE:                                                                                                                                                                                           SUBJECTIVE STATEMENT:  Pt states that she has improved since starting a type of NSAID and therapy. She feels the knees have improved. Pt feels about 30% better than when starting.    Initial Subjective Pt has chronic pain from the neck down daily. Pt has history of fibro as well as history of compression fractures. Pt's knee pain did not respond with any major changes to hyaluronic acid injections into the knee. Pt has had improvement in pain with Pain with Ramandeep at the [place] on Monday, Wed, and Fri. Pt does have upper neck and shoulder pain on the R. Pt notes that the knee has previously given out before with standing but has not happened recently. Pt has pain in multiple joint sites, especially into L hand/thumb. Pt does find that stress does effect her pain. Denies red flags.   Pt has had not had balance issues or falls.   PERTINENT HISTORY:  Previous L1 compressions fracture 2001  PAIN:  Are you having pain? Yes: NPRS scale: 5/10 Pain location: upper back/neck Pain description: varying- can be dull, ache, sharp Aggravating factors: slightly bent/flexed position at back Relieving factors: aquatic exercise, voltaren, heat, massage gun   PRECAUTIONS: None  RED FLAGS: None   WEIGHT BEARING RESTRICTIONS: No  FALLS:  Has patient fallen in last 6 months? 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  DIAGNOSTIC FINDINGS:  L knee xray  Impression: These findings are consistent with moderate chondromalacia  patella .   R knee xray Impression: These findings are consistent with mild osteoarthritis and  severe chondromalacia patella.   PATIENT SURVEYS:  FOTO 53 61 @ DC for knee  FOTO 53 60 @ DC for lumbar  1/7  49% knee  56% lumbar    LUMBAR ROM:   AROM eval 1/7  Flexion 90% 90%  Extension 50% 75%  Right  lateral  flexion 50% 75%  Left lateral flexion 50% 50%  Right rotation 75% 75%  Left rotation 75% 75%   (Blank rows = not tested)  LOWER EXTREMITY ROM:     Active  Right eval Left eval Left 12/5 L 1/7 R 1/7  Hip flexion       Hip extension       Hip abduction       Hip adduction       Hip internal rotation       Hip external rotation 50 40     Knee flexion 125 125  125 125  Knee extension 0 -5 0 0 -2  Ankle dorsiflexion       Ankle plantarflexion       Ankle inversion       Ankle eversion        (Blank rows = not tested)  LOWER EXTREMITY MMT:    MMT Right eval Left eval Rt/Lt 12/5 R/L 1/7  Hip flexion 43.3 45.9    Hip extension      Hip abduction 38.1 33.3 39.0 39  Hip adduction      Hip internal rotation      Hip external rotation      Knee flexion      Knee extension 28.6 33.1 39.3/38.2 41.4/ 51.1  Ankle dorsiflexion      Ankle plantarflexion      Ankle inversion      Ankle eversion       (Blank rows = not tested)   GAIT: Distance walked: 66ft Assistive device utilized: None Level of assistance: Complete Independence Comments: mild flexed knee gait, no signficant deviations  TODAY'S TREATMENT:   1/7  Exam findings, surgical vs conservative management of knee OA  Knee extension machine DL 6k89 84oad Calf machine DL 6k89 Hamstring curl 6k89 DL 25 lbs  Machine set up, weight selection, graded exposure  11/10/23 Nustep Lvl 5 6 min Shuttle leg press  without band today 75lbs 3x10 Lateral step down 4 inch 2 x 10 bilateral  Tandem airex pad 30s 3x  5lbs farmer carry 22ft 2x laps  Lift/chops BTB 1 x 5 bilateral each SLS 2 x 30 second holds bilateral  Treatment                            12/26:  Nustep Lvl 5 6 min  Shuttle leg press  without band today 75lbs 3x10 Toe walk 3x laps in hallway 5lbs farmer carry 43ft 3x laps (focus on upright posture and stacking) Use of paper tape or k-tape discussed for external cuing of posture as pt asks about  shoulder bracing for posture 2 box step down 2x10 Tandem airex pad 30s 3x  YTB sidestepping at knees 90ft 2x laps   Treatment                            12/19:  Nustep Lvl 5 6 min  Shuttle leg press YTB at knees 75lbs 3x10 Shuttle leg press SL 25lbs 3x10  Tandem airex pad 30s 3x  YTB sidestepping at knees 15ft 2x laps   5lb LAQ 2x10 each  Treatment                            12/17:  Nustep Lvl 5 5 min  Shuttle leg press YTB at knees 75lbs 3x10 RTB row  2x10 RTB paloff 2x10 S/L clamshell 3s holds 2x10 each Bridge 2x10 YTB at knees Supine piriformis stretch 30s 3x                                                                                                            Treatment                            12/5:  Seated piriformis stretch Hooklying core engagement, + abd press into green tband Hooklying UE press into swiss ball Seated on swiss ball ab set, + bouncing Standing lat press into swiss ball Standing ragdoll over swiss ball, head turned Lt and breathing into Rt ribcage     PATIENT EDUCATION:  Education details: anatomy, exercise progression, DOMS expectations, muscle firing,  HEP, POC Person educated: Patient Education method: Explanation, Demonstration, Tactile cues, Verbal cues, and Handouts Education comprehension: verbalized understanding, returned demonstration, verbal cues required, and tactile cues required  HOME EXERCISE PROGRAM: Land: [email or web address]  Access Code: [email or web address] URL: [email or web address] Date: 10/03/23 Prepared by: Frankie Ziemba This aquatic home exercise program from Med[employer] utilizes pictures from land based exercises, but has been adapted prior to lamination and issuance.    ASSESSMENT:  CLINICAL IMPRESSION: Pt with objective improvement in knee ext strength and lumbar ROM. Pt's R knee crepitus and pain does seem to be limiting her progress with strengthening. Pt is making steady progress as demonstrated by subjectice and  objective measures though progress is slow given medical history. Pt has been able to start progressive resistance training for the knees and lumbopelvic region but is limited by pain at the R knee. Continue with LE and core strength as tolerated with care given towards proper set up and technique as pt does not have experience with strength training.  Pt would benefit from continued skilled therapy in order to reach goals and maximize functional LE and trunk strength for prevention of further functional decline.   Initial Impression Patient is a 68 y.o. female who was seen today for physical therapy evaluation and treatment for c/c of LBP and knee pain. Pt's s/s appear consistent with mechanical LBP from history of fracture and surgery as well as significant knee OA. Pt's pain is moderately sensitive and irritable with movement. Pt's is more strength limited at this time. Plan to continue with developing aquatic HEP and land based strengthening at future sessions for knees and core.  Pt would benefit from continued skilled therapy in order to reach goals and maximize functional LE and trunk strength for prevention of further functional decline.   OBJECTIVE IMPAIRMENTS: Abnormal gait, decreased activity tolerance, decreased balance, decreased endurance, decreased knowledge of use of DME, decreased mobility, difficulty walking, decreased ROM, decreased strength, hypomobility, impaired flexibility, improper body mechanics, postural dysfunction, and pain.    ACTIVITY LIMITATIONS: carrying, lifting, bending, standing, squatting, stairs, transfers, bed mobility, and locomotion level   PARTICIPATION LIMITATIONS: meal prep, cleaning, laundry, driving, shopping, and community activity and exercise   PERSONAL FACTORS: Age, Fitness, Time since  onset of injury/illness/exacerbation, and 2+ co morbidities:    are also affecting patient's functional outcome.    REHAB POTENTIAL: Fair     CLINICAL DECISION  MAKING: Evolving/moderate complexity   EVALUATION COMPLEXITY: Moderate     GOALS:     SHORT TERM GOALS: Target date: 10/25/2023      Pt will become independent with HEP in order to demonstrate synthesis of PT education.    Goal status: ongoing   2.  Pt will report at least 2 pt reduction on NPRS scale for pain in order to demonstrate functional improvement with household activity, self care, and ADL.    Goal status: MET   3.  Pt will be able to demonstrate/report ability to sit/stand/sleep for extended periods of time without pain in order to demonstrate functional improvement and tolerance to static positioning.    Goal status: MET       LONG TERM GOALS: Target date: 12/06/2023      Pt  will become independent with final HEP in order to demonstrate synthesis of PT education.    Goal status: ongoing   Pt will be able to demonstrate at least a 10lb increase in HHD testing for the LE in order to demonstrate improvement in LE strength for daily mobility.    Goal status: partially met   3.  Pt will be able to demonstrate/report ability to sit/stand/sleep for extended periods of time without pain in order to demonstrate functional improvement and tolerance to static positioning.     Goal status: MET   4.  Pt will score >/= 60 and 61 on FOTO to demonstrate improvement in perceived lumbar  and knee function.    Goal status: ongoing   5. Pt will be able to lift/squat/hold >25 lbs in order to demonstrate functional improvement in lumbopelvic strength for return ADL and exercise.  Goal status: ongoing     PLAN:   PT FREQUENCY: 1-2x/week   PT DURATION: 12 weeks   PLANNED INTERVENTIONS: Therapeutic exercises, Therapeutic activity, Neuromuscular re-education, Balance training, Gait training, Patient/Family education, Self Care, Joint mobilization, Joint manipulation, Stair training, Prosthetic training, DME instructions, Aquatic Therapy, Dry Needling, Electrical  stimulation, Spinal manipulation, Spinal mobilization, Moist heat, scar mobilization, Splintting, Taping, Vasopneumatic device, Traction, Ultrasound, Ionotophoresis 4mg /ml Dexamethasone , Manual therapy, and Re-evaluation   PLAN FOR NEXT SESSION:  Land based strengthening for lumbopelvic  and knee strength   Dale Call, PT 11/14/2023, 2:04 PM

## 2023-11-16 ENCOUNTER — Ambulatory Visit (HOSPITAL_BASED_OUTPATIENT_CLINIC_OR_DEPARTMENT_OTHER): Payer: Medicare Other | Admitting: Physical Therapy

## 2023-11-16 ENCOUNTER — Encounter (HOSPITAL_BASED_OUTPATIENT_CLINIC_OR_DEPARTMENT_OTHER): Payer: Self-pay | Admitting: Physical Therapy

## 2023-11-16 DIAGNOSIS — M545 Low back pain, unspecified: Secondary | ICD-10-CM

## 2023-11-16 DIAGNOSIS — M25562 Pain in left knee: Secondary | ICD-10-CM | POA: Diagnosis not present

## 2023-11-16 DIAGNOSIS — G8929 Other chronic pain: Secondary | ICD-10-CM | POA: Diagnosis not present

## 2023-11-16 DIAGNOSIS — M25561 Pain in right knee: Secondary | ICD-10-CM

## 2023-11-16 NOTE — Therapy (Signed)
 OUTPATIENT PHYSICAL THERAPY THORACOLUMBAR TREATMENT  Progress Note Reporting Period 09/13/23 to 11/13/22  See note below for Objective Data and Assessment of Progress/Goals.      Patient Name: Courtney Waters MRN: [id number] DOB:03-08-1956, 68 y.o., female Today's Date: 11/16/2023  END OF SESSION:  PT End of Session - 11/16/23 1358     Visit Number 11    Number of Visits 18    Date for PT Re-Evaluation 12/12/23    Authorization Type MCR    PT Start Time 1400    PT Stop Time 1440    PT Time Calculation (min) 40 min    Activity Tolerance Patient tolerated treatment well    Behavior During Therapy [facility] for tasks assessed/performed                   Past Medical History:  Diagnosis Date   Acid reflux    Arthritis    hands and neck   Back pain 12/01/1998   compression fracture L1 from sledding accident   Bladder infection    Depression    Fibromyalgia    Hearing loss in right ear    Heart murmur    Hiatal hernia 11/2017   MVP (mitral valve prolapse)    Hx   SVD (spontaneous vaginal delivery)    x 2   Past Surgical History:  Procedure Laterality Date   BASAL CELL CARCINOMA EXCISION  12/2021   nose   BUNIONECTOMY Left 10/16/2014   Dr. 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   EYE SURGERY Bilateral 2022   cataract surgery L eye 09/23/21 R eye 10/07/21   INSERTION OF MESH N/A 12/28/2021   Procedure: INSERTION OF MESH;  Surgeon: Rubin Calamity, MD;  Location: [facility] OR;  Service: General;  Laterality: N/A;   LAPAROSCOPIC BILATERAL SALPINGO OOPHERECTOMY Bilateral 05/13/2014   Procedure: LAPAROSCOPIC BILATERAL SALPINGO OOPHORECTOMY;  Surgeon: Ronal Elvie Pinal, MD;  Location: [facility] ORS;  Service: Gynecology;  Laterality: Bilateral;   toe nail removal Left 03/2018   TONSILLECTOMY  age 40   WISDOM TOOTH EXTRACTION  age 43   XI ROBOTIC ASSISTED HIATAL HERNIA  REPAIR N/A 12/28/2021   Procedure: XI ROBOTIC ASSISTED HIATAL HERNIA REPAIR WITH MESH AND FUNDOPLICATION;  Surgeon: Rubin Calamity, MD;  Location: [facility] OR;  Service: General;  Laterality: N/A;   Patient Active Problem List   Diagnosis Date Noted   Basal cell carcinoma (BCC) of dorsum of nose 02/04/2022   S/P Nissen fundoplication (without gastrostomy tube) procedure 12/28/2021   Gastroesophageal reflux disease with hiatal hernia 11/12/2021   S/P BSO (bilateral salpingo-oophorectomy) 08/12/2021   Hiatal hernia 08/12/2021   Primary osteoarthritis of both knees 07/25/2017   Screening for osteoporosis 07/25/2017   DDD (degenerative disc disease), lumbar 07/25/2017   Primary osteoarthritis of both hands 01/26/2017   Primary osteoarthritis of both feet 01/26/2017   Other idiopathic scoliosis, lumbar region 01/26/2017   Fibromyalgia 02/05/2015   Postmenopausal 08/07/2014   Enthesopathy of ankle and tarsus 08/14/2007   BUNIONS, BILATERAL 08/14/2007   UNEQUAL LEG LENGTH, ACQUIRED 08/14/2007   HEEL PAIN, LEFT 08/08/2007   Osteopenia 08/08/2007    PCP: Yolande Toribio MATSU, MD   REFERRING PROVIDER: Dolphus Reiter, MD   REFERRING DIAG:  Diagnosis  M22.41,M22.42 (ICD-10-CM) - Chondromalacia of both patellae  M17.0 (ICD-10-CM) - Primary osteoarthritis of both knees  M51.369 (ICD-10-CM) -  DDD (degenerative disc disease), lumbar    Rationale for Evaluation and Treatment: Rehabilitation  THERAPY DIAG:  Pain, lumbar region  Pain in joint of right knee  Chronic pain of left knee  ONSET DATE: 2+ years   SUBJECTIVE:                                                                                                                                                                                           SUBJECTIVE STATEMENT:  Pt states  she felt the exercise from last session but no increase in pain. Pt felt more fatigued yesterday that may be fibromyalgia related.   Initial  Subjective Pt has chronic pain from the neck down daily. Pt has history of fibro as well as history of compression fractures. Pt's knee pain did not respond with any major changes to hyaluronic acid injections into the knee. Pt has had improvement in pain with Pain with Genieve at the [facility] on Monday, Wed, and Fri. Pt does have upper neck and shoulder pain on the R. Pt notes that the knee has previously given out before with standing but has not happened recently. Pt has pain in multiple joint sites, especially into L hand/thumb. Pt does find that stress does effect her pain. Denies red flags.   Pt has had not had balance issues or falls.   PERTINENT HISTORY:  Previous L1 compressions fracture 2001  PAIN:  Are you having pain? Yes: NPRS scale: 5/10 Pain location: upper back/neck Pain description: varying- can be dull, ache, sharp Aggravating factors: slightly bent/flexed position at back Relieving factors: aquatic exercise, voltaren, heat, massage gun   PRECAUTIONS: None  RED FLAGS: None   WEIGHT BEARING RESTRICTIONS: No  FALLS:  Has patient fallen in last 6 months? 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  DIAGNOSTIC FINDINGS:  L knee xray  Impression: These findings are consistent with moderate chondromalacia  patella .   R knee xray Impression: These findings are consistent with mild osteoarthritis and  severe chondromalacia patella.   PATIENT SURVEYS:  FOTO 53 61 @ DC for knee  FOTO 53 60 @ DC for lumbar  1/7  49% knee  56% lumbar    LUMBAR ROM:   AROM eval 1/7  Flexion 90% 90%  Extension 50% 75%  Right lateral flexion  50%  75%  Left lateral flexion 50% 50%  Right rotation 75% 75%  Left rotation 75% 75%   (Blank rows = not tested)  LOWER EXTREMITY ROM:     Active  Right eval Left eval Left 12/5 L 1/7 R 1/7  Hip flexion       Hip extension       Hip abduction       Hip adduction       Hip internal rotation       Hip external rotation 50 40     Knee flexion 125 125  125 125  Knee extension 0 -5 0 0 -2  Ankle dorsiflexion       Ankle plantarflexion       Ankle inversion       Ankle eversion        (Blank rows = not tested)  LOWER EXTREMITY MMT:    MMT Right eval Left eval Rt/Lt 12/5 R/L 1/7  Hip flexion 43.3 45.9    Hip extension      Hip abduction 38.1 33.3 39.0 39  Hip adduction      Hip internal rotation      Hip external rotation      Knee flexion      Knee extension 28.6 33.1 39.3/38.2 41.4/ 51.1  Ankle dorsiflexion      Ankle plantarflexion      Ankle inversion      Ankle eversion       (Blank rows = not tested)   GAIT: Distance walked: 59ft Assistive device utilized: None Level of assistance: Complete Independence Comments: mild flexed knee gait, no signficant deviations  TODAY'S TREATMENT:    1/9  Nustep Lvl 5 6 min  Shuttle leg press  single leg 50lbs 2x10 ea Knee extension machine DL 6k89 84oad Seated T/S ext with towel roll 15x 5lbs farmer carry 64ft 3x laps  DL HR 6k87 Wall T/S ext with arms opening 2x10   1/7  Exam findings, surgical vs conservative management of knee OA  Knee extension machine DL 6k89 84oad Calf machine DL 6k89 Hamstring curl 6k89 DL 25 lbs  Machine set up, weight selection, graded exposure  11/10/23 Nustep Lvl 5 6 min Shuttle leg press  without band today 75lbs 3x10 Lateral step down 4 inch 2 x 10 bilateral  Tandem airex pad 30s 3x  5lbs farmer carry 39ft 2x laps  Lift/chops BTB 1 x 5 bilateral each SLS 2 x 30 second holds bilateral  Treatment                            12/26:  Nustep Lvl 5 6 min  Shuttle leg press   without band today 75lbs 3x10 Toe walk 3x laps in hallway 5lbs farmer carry 76ft 3x laps (focus on upright posture and stacking) Use of paper tape or k-tape discussed for external cuing of posture as pt asks about shoulder bracing for posture 2 box step down 2x10 Tandem airex pad 30s 3x  YTB sidestepping at knees 29ft 2x laps   Treatment                            12/19:  Nustep Lvl 5 6 min  Shuttle leg press YTB at knees 75lbs 3x10 Shuttle leg press SL 25lbs 3x10  Tandem airex pad 30s 3x  YTB sidestepping at knees 55ft 2x laps   5lb LAQ 2x10  each  Treatment                            12/17:  Nustep Lvl 5 5 min  Shuttle leg press YTB at knees 75lbs 3x10 RTB row 2x10 RTB paloff 2x10 S/L clamshell 3s holds 2x10 each Bridge 2x10 YTB at knees Supine piriformis stretch 30s 3x                                                                                                            Treatment                            12/5:  Seated piriformis stretch Hooklying core engagement, + abd press into green tband Hooklying UE press into swiss ball Seated on swiss ball ab set, + bouncing Standing lat press into swiss ball Standing ragdoll over swiss ball, head turned Lt and breathing into Rt ribcage     PATIENT EDUCATION:  Education details: anatomy, exercise progression, DOMS expectations, muscle firing,  HEP, POC Person educated: Patient Education method: Explanation, Demonstration, Tactile cues, Verbal cues, and Handouts Education comprehension: verbalized understanding, returned demonstration, verbal cues required, and tactile cues required  HOME EXERCISE PROGRAM: Land: [email or web address]  Access Code: [email or web address] URL: [email or web address] Date: 10/03/23 Prepared by: Frankie Ziemba This aquatic home exercise program from [employer] utilizes pictures from land based exercises, but has been adapted prior to lamination and issuance.    ASSESSMENT:  CLINICAL  IMPRESSION: Pt with good tolerance to progressive LE exercise and postural exercise today without increase in pain. Pt without complaints of increase in fatigue or pain but does report muscular working fatigue with LE and UE exercise. HEP updated with gym related strengthening as well as postural/mobility type movement. Plan to continue with balance and stability at the knee/hips at next.   Pt would benefit from continued skilled therapy in order to reach goals and maximize functional LE and trunk strength for prevention of further functional decline.   Initial Impression Patient is a 68 y.o. female who was seen today for physical therapy evaluation and treatment for c/c of LBP and knee pain. Pt's s/s appear consistent with mechanical LBP from history of fracture and surgery as well as significant knee OA. Pt's pain is moderately sensitive and irritable with movement. Pt's is more strength limited at this time. Plan to continue with developing aquatic HEP and land based strengthening at future sessions for knees and core.  Pt would benefit from continued skilled therapy in order to reach goals and maximize functional LE and trunk strength for prevention of further functional decline.   OBJECTIVE IMPAIRMENTS: Abnormal gait, decreased activity tolerance, decreased balance, decreased endurance, decreased knowledge of use of DME, decreased mobility, difficulty walking, decreased ROM, decreased strength, hypomobility, impaired flexibility, improper body mechanics, postural dysfunction, and pain.    ACTIVITY LIMITATIONS: carrying, lifting, bending, standing, squatting, stairs, transfers, bed mobility, and locomotion level   PARTICIPATION LIMITATIONS: meal prep, cleaning, laundry, driving,  shopping, and community activity and exercise   PERSONAL FACTORS: Age, Fitness, Time since onset of injury/illness/exacerbation, and 2+ co morbidities:    are also affecting patient's functional outcome.    REHAB  POTENTIAL: Fair     CLINICAL DECISION MAKING: Evolving/moderate complexity   EVALUATION COMPLEXITY: Moderate     GOALS:     SHORT TERM GOALS: Target date: 10/25/2023      Pt will become independent with HEP in order to demonstrate synthesis of PT education.    Goal status: ongoing   2.  Pt will report at least 2 pt reduction on NPRS scale for pain in order to demonstrate functional improvement with household activity, self care, and ADL.    Goal status: MET   3.  Pt will be able to demonstrate/report ability to sit/stand/sleep for extended periods of time without pain in order to demonstrate functional improvement and tolerance to static positioning.    Goal status: MET       LONG TERM GOALS: Target date: 12/06/2023      Pt  will become independent with final HEP in order to demonstrate synthesis of PT education.    Goal status: ongoing   Pt will be able to demonstrate at least a 10lb increase in HHD testing for the LE in order to demonstrate improvement in LE strength for daily mobility.    Goal status: partially met   3.  Pt will be able to demonstrate/report ability to sit/stand/sleep for extended periods of time without pain in order to demonstrate functional improvement and tolerance to static positioning.     Goal status: MET   4.  Pt will score >/= 60 and 61 on FOTO to demonstrate improvement in perceived lumbar  and knee function.    Goal status: ongoing   5. Pt will be able to lift/squat/hold >25 lbs in order to demonstrate functional improvement in lumbopelvic strength for return ADL and exercise.  Goal status: ongoing     PLAN:   PT FREQUENCY: 1-2x/week   PT DURATION: 12 weeks   PLANNED INTERVENTIONS: Therapeutic exercises, Therapeutic activity, Neuromuscular re-education, Balance training, Gait training, Patient/Family education, Self Care, Joint mobilization, Joint manipulation, Stair training, Prosthetic training, DME instructions, Aquatic  Therapy, Dry Needling, Electrical stimulation, Spinal manipulation, Spinal mobilization, Moist heat, scar mobilization, Splintting, Taping, Vasopneumatic device, Traction, Ultrasound, Ionotophoresis 4mg /ml Dexamethasone , Manual therapy, and Re-evaluation   PLAN FOR NEXT SESSION:  Land based strengthening for lumbopelvic  and knee strength   Dale Call, PT 11/16/2023, 2:45 PM

## 2023-11-20 ENCOUNTER — Encounter (HOSPITAL_BASED_OUTPATIENT_CLINIC_OR_DEPARTMENT_OTHER): Payer: Self-pay | Admitting: Physical Therapy

## 2023-11-20 ENCOUNTER — Ambulatory Visit (HOSPITAL_BASED_OUTPATIENT_CLINIC_OR_DEPARTMENT_OTHER): Payer: Medicare Other | Admitting: Physical Therapy

## 2023-11-20 DIAGNOSIS — M545 Low back pain, unspecified: Secondary | ICD-10-CM | POA: Diagnosis not present

## 2023-11-20 DIAGNOSIS — M25562 Pain in left knee: Secondary | ICD-10-CM | POA: Diagnosis not present

## 2023-11-20 DIAGNOSIS — G8929 Other chronic pain: Secondary | ICD-10-CM

## 2023-11-20 DIAGNOSIS — M25561 Pain in right knee: Secondary | ICD-10-CM | POA: Diagnosis not present

## 2023-11-20 NOTE — Therapy (Signed)
 OUTPATIENT PHYSICAL THERAPY THORACOLUMBAR TREATMENT      Patient Name: Courtney Waters MRN: [id number] DOB:09-02-1956, 68 y.o., female Today's Date: 11/20/2023  END OF SESSION:  PT End of Session - 11/20/23 1313     Visit Number 12    Number of Visits 18    Date for PT Re-Evaluation 12/12/23    Authorization Type MCR    PT Start Time 1315    PT Stop Time 1355    PT Time Calculation (min) 40 min    Activity Tolerance Patient tolerated treatment well    Behavior During Therapy [facility] for tasks assessed/performed                   Past Medical History:  Diagnosis Date   Acid reflux    Arthritis    hands and neck   Back pain 12/01/1998   compression fracture L1 from sledding accident   Bladder infection    Depression    Fibromyalgia    Hearing loss in right ear    Heart murmur    Hiatal hernia 11/2017   MVP (mitral valve prolapse)    Hx   SVD (spontaneous vaginal delivery)    x 2   Past Surgical History:  Procedure Laterality Date   BASAL CELL CARCINOMA EXCISION  12/2021   nose   BUNIONECTOMY Left 10/16/2014   Dr. 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   EYE SURGERY Bilateral 2022   cataract surgery L eye 09/23/21 R eye 10/07/21   INSERTION OF MESH N/A 12/28/2021   Procedure: INSERTION OF MESH;  Surgeon: Rubin Calamity, MD;  Location: [facility] OR;  Service: General;  Laterality: N/A;   LAPAROSCOPIC BILATERAL SALPINGO OOPHERECTOMY Bilateral 05/13/2014   Procedure: LAPAROSCOPIC BILATERAL SALPINGO OOPHORECTOMY;  Surgeon: Ronal Elvie Pinal, MD;  Location: [facility] ORS;  Service: Gynecology;  Laterality: Bilateral;   toe nail removal Left 03/2018   TONSILLECTOMY  age 32   WISDOM TOOTH EXTRACTION  age 24   XI ROBOTIC ASSISTED HIATAL HERNIA REPAIR N/A 12/28/2021   Procedure: XI ROBOTIC ASSISTED HIATAL HERNIA REPAIR WITH MESH AND FUNDOPLICATION;  Surgeon:  Rubin Calamity, MD;  Location: [facility] OR;  Service: General;  Laterality: N/A;   Patient Active Problem List   Diagnosis Date Noted   Basal cell carcinoma (BCC) of dorsum of nose 02/04/2022   S/P Nissen fundoplication (without gastrostomy tube) procedure 12/28/2021   Gastroesophageal reflux disease with hiatal hernia 11/12/2021   S/P BSO (bilateral salpingo-oophorectomy) 08/12/2021   Hiatal hernia 08/12/2021   Primary osteoarthritis of both knees 07/25/2017   Screening for osteoporosis 07/25/2017   DDD (degenerative disc disease), lumbar 07/25/2017   Primary osteoarthritis of both hands 01/26/2017   Primary osteoarthritis of both feet 01/26/2017   Other idiopathic scoliosis, lumbar region 01/26/2017   Fibromyalgia 02/05/2015   Postmenopausal 08/07/2014   Enthesopathy of ankle and tarsus 08/14/2007   BUNIONS, BILATERAL 08/14/2007   UNEQUAL LEG LENGTH, ACQUIRED 08/14/2007   HEEL PAIN, LEFT 08/08/2007   Osteopenia 08/08/2007    PCP: Yolande Toribio MATSU, MD   REFERRING PROVIDER: Dolphus Reiter, MD   REFERRING DIAG:  Diagnosis  M22.41,M22.42 (ICD-10-CM) - Chondromalacia of both patellae  M17.0 (ICD-10-CM) - Primary osteoarthritis of both knees  M51.369 (ICD-10-CM) - DDD (degenerative disc disease), lumbar    Rationale for Evaluation and Treatment: Rehabilitation  THERAPY DIAG:  Pain,  lumbar region  Pain in joint of right knee  Chronic pain of left knee  ONSET DATE: 2+ years   SUBJECTIVE:                                                                                                                                                                                           SUBJECTIVE STATEMENT:  Pt states  she felt the exercise from last session but no increase in pain. Pt felt more fatigued yesterday that may be fibromyalgia related.   Initial Subjective Pt has chronic pain from the neck down daily. Pt has history of fibro as well as history of compression fractures.  Pt's knee pain did not respond with any major changes to hyaluronic acid injections into the knee. Pt has had improvement in pain with Pain with Tarae at the [place] on Monday, Wed, and Fri. Pt does have upper neck and shoulder pain on the R. Pt notes that the knee has previously given out before with standing but has not happened recently. Pt has pain in multiple joint sites, especially into L hand/thumb. Pt does find that stress does effect her pain. Denies red flags.   Pt has had not had balance issues or falls.   PERTINENT HISTORY:  Previous L1 compressions fracture 2001  PAIN:  Are you having pain? Yes: NPRS scale: 5/10 Pain location: upper back/neck Pain description: varying- can be dull, ache, sharp Aggravating factors: slightly bent/flexed position at back Relieving factors: aquatic exercise, voltaren, heat, massage gun   PRECAUTIONS: None  RED FLAGS: None   WEIGHT BEARING RESTRICTIONS: No  FALLS:  Has patient fallen in last 6 months? 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  DIAGNOSTIC FINDINGS:  L knee xray  Impression: These findings are consistent with moderate chondromalacia  patella .   R knee xray Impression: These findings are consistent with mild osteoarthritis and  severe chondromalacia patella.   PATIENT SURVEYS:  FOTO 53 61 @ DC for knee  FOTO 53 60 @ DC for lumbar  1/7  49% knee  56% lumbar    LUMBAR ROM:   AROM eval 1/7  Flexion 90% 90%  Extension 50% 75%  Right lateral flexion 50% 75%  Left lateral flexion 50% 50%  Right rotation 75% 75%  Left rotation 75% 75%   (Blank  rows = not tested)  LOWER  EXTREMITY ROM:     Active  Right eval Left eval Left 12/5 L 1/7 R 1/7  Hip flexion       Hip extension       Hip abduction       Hip adduction       Hip internal rotation       Hip external rotation 50 40     Knee flexion 125 125  125 125  Knee extension 0 -5 0 0 -2  Ankle dorsiflexion       Ankle plantarflexion       Ankle inversion       Ankle eversion        (Blank rows = not tested)  LOWER EXTREMITY MMT:    MMT Right eval Left eval Rt/Lt 12/5 R/L 1/7  Hip flexion 43.3 45.9    Hip extension      Hip abduction 38.1 33.3 39.0 39  Hip adduction      Hip internal rotation      Hip external rotation      Knee flexion      Knee extension 28.6 33.1 39.3/38.2 41.4/ 51.1  Ankle dorsiflexion      Ankle plantarflexion      Ankle inversion      Ankle eversion       (Blank rows = not tested)   GAIT: Distance walked: 62ft Assistive device utilized: None Level of assistance: Complete Independence Comments: mild flexed knee gait, no signficant deviations  TODAY'S TREATMENT:    1/13  Nustep Lvl 5 6 min  Shuttle leg press  DL leg 893oad 6k89 ea S/L shuttle press 25lbs 2x10 SLS on airex 30s 3x; with toe tap x20 Chest supported row 3x8-10 20lbs  Gym HEP, machine set up, machine vs cable vs free weight usage  1/9  Nustep Lvl 5 6 min  Shuttle leg press  single leg 50lbs 2x10 ea Knee extension machine DL 6k89 84oad Seated T/S ext with towel roll 15x 5lbs farmer carry 51ft 3x laps  DL HR 6k87 Wall T/S ext with arms opening 2x10   1/7  Exam findings, surgical vs conservative management of knee OA  Knee extension machine DL 6k89 84oad Calf machine DL 6k89 Hamstring curl 6k89 DL 25 lbs  Machine set up, weight selection, graded exposure  11/10/23 Nustep Lvl 5 6 min Shuttle leg press  without band today 75lbs 3x10 Lateral step down 4 inch 2 x 10 bilateral  Tandem airex pad 30s 3x  5lbs farmer carry 25ft 2x laps  Lift/chops BTB 1 x 5 bilateral each SLS 2  x 30 second holds bilateral  Treatment                            12/26:  Nustep Lvl 5 6 min  Shuttle leg press  without band today 75lbs 3x10 Toe walk 3x laps in hallway 5lbs farmer carry 75ft 3x laps (focus on upright posture and stacking) Use of paper tape or k-tape discussed for external cuing of posture as pt asks about shoulder bracing for posture 2 box step down 2x10 Tandem airex pad 30s 3x  YTB sidestepping at knees 79ft 2x laps   Treatment                            12/19:  Nustep Lvl 5 6 min  Shuttle leg  press YTB at knees 75lbs 3x10 Shuttle leg press SL 25lbs 3x10  Tandem airex pad 30s 3x  YTB sidestepping at knees 77ft 2x laps   5lb LAQ 2x10 each  Treatment                            12/17:  Nustep Lvl 5 5 min  Shuttle leg press YTB at knees 75lbs 3x10 RTB row 2x10 RTB paloff 2x10 S/L clamshell 3s holds 2x10 each Bridge 2x10 YTB at knees Supine piriformis stretch 30s 3x                                                                                                            Treatment                            12/5:  Seated piriformis stretch Hooklying core engagement, + abd press into green tband Hooklying UE press into swiss ball Seated on swiss ball ab set, + bouncing Standing lat press into swiss ball Standing ragdoll over swiss ball, head turned Lt and breathing into Rt ribcage     PATIENT EDUCATION:  Education details: anatomy, exercise progression, DOMS expectations, muscle firing,  HEP, POC Person educated: Patient Education method: Explanation, Demonstration, Tactile cues, Verbal cues, and Handouts Education comprehension: verbalized understanding, returned demonstration, verbal cues required, and tactile cues required  HOME EXERCISE PROGRAM: Land: [email or web address]  Access Code: [email or web address] URL: [email or web address] Date: 10/03/23 Prepared by: Frankie Ziemba This aquatic home exercise program from [employer] utilizes pictures from  land based exercises, but has been adapted prior to lamination and issuance.    ASSESSMENT:  CLINICAL IMPRESSION: Pt with good tolerance to addition of balance and stability as well as revisiting of gym based exercise for posture and LE strength. Pt does have mild knee pain with CKC movement but it is 2-3/10 at home and does not linger. Plan to pt to test out HEP in the gym setting before modification with PT. Plan to progress postural strength, LE stability and strength, and thoracic mobility as tolerated.   Pt would benefit from continued skilled therapy in order to reach goals and maximize functional LE and trunk strength for prevention of further functional decline.   Initial Impression Patient is a 68 y.o. female who was seen today for physical therapy evaluation and treatment for c/c of LBP and knee pain. Pt's s/s appear consistent with mechanical LBP from history of fracture and surgery as well as significant knee OA. Pt's pain is moderately sensitive and irritable with movement. Pt's is more strength limited at this time. Plan to continue with developing aquatic HEP and land based strengthening at future sessions for knees and core.  Pt would benefit from continued skilled therapy in order to reach goals and maximize functional LE and trunk strength for prevention of further functional decline.   OBJECTIVE IMPAIRMENTS: Abnormal gait, decreased activity tolerance, decreased balance, decreased endurance, decreased knowledge of use of DME, decreased mobility,  difficulty walking, decreased ROM, decreased strength, hypomobility, impaired flexibility, improper body mechanics, postural dysfunction, and pain.    ACTIVITY LIMITATIONS: carrying, lifting, bending, standing, squatting, stairs, transfers, bed mobility, and locomotion level   PARTICIPATION LIMITATIONS: meal prep, cleaning, laundry, driving, shopping, and community activity and exercise   PERSONAL FACTORS: Age, Fitness, Time since  onset of injury/illness/exacerbation, and 2+ co morbidities:    are also affecting patient's functional outcome.    REHAB POTENTIAL: Fair     CLINICAL DECISION MAKING: Evolving/moderate complexity   EVALUATION COMPLEXITY: Moderate     GOALS:     SHORT TERM GOALS: Target date: 10/25/2023      Pt will become independent with HEP in order to demonstrate synthesis of PT education.    Goal status: ongoing   2.  Pt will report at least 2 pt reduction on NPRS scale for pain in order to demonstrate functional improvement with household activity, self care, and ADL.    Goal status: MET   3.  Pt will be able to demonstrate/report ability to sit/stand/sleep for extended periods of time without pain in order to demonstrate functional improvement and tolerance to static positioning.    Goal status: MET       LONG TERM GOALS: Target date: 12/06/2023      Pt  will become independent with final HEP in order to demonstrate synthesis of PT education.    Goal status: ongoing   Pt will be able to demonstrate at least a 10lb increase in HHD testing for the LE in order to demonstrate improvement in LE strength for daily mobility.    Goal status: partially met   3.  Pt will be able to demonstrate/report ability to sit/stand/sleep for extended periods of time without pain in order to demonstrate functional improvement and tolerance to static positioning.     Goal status: MET   4.  Pt will score >/= 60 and 61 on FOTO to demonstrate improvement in perceived lumbar  and knee function.    Goal status: ongoing   5. Pt will be able to lift/squat/hold >25 lbs in order to demonstrate functional improvement in lumbopelvic strength for return ADL and exercise.  Goal status: ongoing     PLAN:   PT FREQUENCY: 1-2x/week   PT DURATION: 12 weeks   PLANNED INTERVENTIONS: Therapeutic exercises, Therapeutic activity, Neuromuscular re-education, Balance training, Gait training, Patient/Family  education, Self Care, Joint mobilization, Joint manipulation, Stair training, Prosthetic training, DME instructions, Aquatic Therapy, Dry Needling, Electrical stimulation, Spinal manipulation, Spinal mobilization, Moist heat, scar mobilization, Splintting, Taping, Vasopneumatic device, Traction, Ultrasound, Ionotophoresis 4mg /ml Dexamethasone , Manual therapy, and Re-evaluation   PLAN FOR NEXT SESSION:  Land based strengthening for lumbopelvic  and knee strength   Dale Call, PT 11/20/2023, 2:03 PM

## 2023-11-22 ENCOUNTER — Ambulatory Visit (HOSPITAL_BASED_OUTPATIENT_CLINIC_OR_DEPARTMENT_OTHER): Payer: Medicare Other | Admitting: Physical Therapy

## 2023-11-22 ENCOUNTER — Encounter (HOSPITAL_BASED_OUTPATIENT_CLINIC_OR_DEPARTMENT_OTHER): Payer: Self-pay | Admitting: Physical Therapy

## 2023-11-22 DIAGNOSIS — M25561 Pain in right knee: Secondary | ICD-10-CM | POA: Diagnosis not present

## 2023-11-22 DIAGNOSIS — G8929 Other chronic pain: Secondary | ICD-10-CM | POA: Diagnosis not present

## 2023-11-22 DIAGNOSIS — M545 Low back pain, unspecified: Secondary | ICD-10-CM

## 2023-11-22 DIAGNOSIS — M25562 Pain in left knee: Secondary | ICD-10-CM | POA: Diagnosis not present

## 2023-11-22 NOTE — Therapy (Signed)
 OUTPATIENT PHYSICAL THERAPY THORACOLUMBAR TREATMENT      Patient Name: Courtney Waters MRN: [id number] DOB:05-Oct-1956, 68 y.o., female Today's Date: 11/22/2023  END OF SESSION:  PT End of Session - 11/22/23 1258     Visit Number 13    Number of Visits 18    Date for PT Re-Evaluation 12/12/23    Authorization Type MCR    PT Start Time 1300    PT Stop Time 1345    PT Time Calculation (min) 45 min    Activity Tolerance Patient tolerated treatment well    Behavior During Therapy [facility] for tasks assessed/performed                   Past Medical History:  Diagnosis Date   Acid reflux    Arthritis    hands and neck   Back pain 12/01/1998   compression fracture L1 from sledding accident   Bladder infection    Depression    Fibromyalgia    Hearing loss in right ear    Heart murmur    Hiatal hernia 11/2017   MVP (mitral valve prolapse)    Hx   SVD (spontaneous vaginal delivery)    x 2   Past Surgical History:  Procedure Laterality Date   BASAL CELL CARCINOMA EXCISION  12/2021   nose   BUNIONECTOMY Left 10/16/2014   Dr. 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   EYE SURGERY Bilateral 2022   cataract surgery L eye 09/23/21 R eye 10/07/21   INSERTION OF MESH N/A 12/28/2021   Procedure: INSERTION OF MESH;  Surgeon: Shela Derby, MD;  Location: [facility] OR;  Service: General;  Laterality: N/A;   LAPAROSCOPIC BILATERAL SALPINGO OOPHERECTOMY Bilateral 05/13/2014   Procedure: LAPAROSCOPIC BILATERAL SALPINGO OOPHORECTOMY;  Surgeon: Axel Bohr, MD;  Location: [facility] ORS;  Service: Gynecology;  Laterality: Bilateral;   toe nail removal Left 03/2018   TONSILLECTOMY  age 1   WISDOM TOOTH EXTRACTION  age 56   XI ROBOTIC ASSISTED HIATAL HERNIA REPAIR N/A 12/28/2021   Procedure: XI ROBOTIC ASSISTED HIATAL HERNIA REPAIR WITH MESH AND FUNDOPLICATION;  Surgeon:  Shela Derby, MD;  Location: [facility] OR;  Service: General;  Laterality: N/A;   Patient Active Problem List   Diagnosis Date Noted   Basal cell carcinoma (BCC) of dorsum of nose 02/04/2022   S/P Nissen fundoplication (without gastrostomy tube) procedure 12/28/2021   Gastroesophageal reflux disease with hiatal hernia 11/12/2021   S/P BSO (bilateral salpingo-oophorectomy) 08/12/2021   Hiatal hernia 08/12/2021   Primary osteoarthritis of both knees 07/25/2017   Screening for osteoporosis 07/25/2017   DDD (degenerative disc disease), lumbar 07/25/2017   Primary osteoarthritis of both hands 01/26/2017   Primary osteoarthritis of both feet 01/26/2017   Other idiopathic scoliosis, lumbar region 01/26/2017   Fibromyalgia 02/05/2015   Postmenopausal 08/07/2014   Enthesopathy of ankle and tarsus 08/14/2007   BUNIONS, BILATERAL 08/14/2007   UNEQUAL LEG LENGTH, ACQUIRED 08/14/2007   HEEL PAIN, LEFT 08/08/2007   Osteopenia 08/08/2007    PCP: Bertha Broad, MD   REFERRING PROVIDER: Nicholas Bari, MD   REFERRING DIAG:  Diagnosis  M22.41,M22.42 (ICD-10-CM) - Chondromalacia of both patellae  M17.0 (ICD-10-CM) - Primary osteoarthritis of both knees  M51.369 (ICD-10-CM) - DDD (degenerative disc disease), lumbar    Rationale for Evaluation and Treatment: Rehabilitation  THERAPY DIAG:  Pain,  lumbar region  Pain in joint of right knee  Chronic pain of left knee  ONSET DATE: 2+ years   SUBJECTIVE:                                                                                                                                                                                           SUBJECTIVE STATEMENT:  Pt states  feeling good. HEP going well. [facility] Feels she needs to practice the machines and set up more.   Initial Subjective Pt has chronic pain from the neck down daily. Pt has history of fibro as well as history of compression fractures. Pt's  knee pain did not respond with any major changes to hyaluronic acid injections into the knee. Pt has had improvement in pain with "Pain with [facility]" at the [facility] on Monday, Wed, and Fri. Pt does have upper neck and shoulder pain on the R. Pt notes that the knee has previously "given" out before with standing but has not happened recently. Pt has pain in multiple joint sites, especially into L hand/thumb. Pt does find that stress does effect her pain. Denies red flags.   Pt has had not had balance issues or falls.   PERTINENT HISTORY:  Previous L1 compressions fracture 2001  PAIN:  Are you having pain? Yes: NPRS scale: 0/10 at rest today Pain location: knees Pain description: varying- can be dull, ache, sharp Aggravating factors: slightly bent/flexed position at back Relieving factors: aquatic exercise, voltaren, heat, massage gun   PRECAUTIONS: None  RED FLAGS: None   WEIGHT BEARING RESTRICTIONS: No  FALLS:  Has patient fallen in last 6 months? 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  DIAGNOSTIC FINDINGS:  L knee xray  Impression: These findings are consistent with moderate chondromalacia  patella .   R knee xray Impression: These findings are consistent with mild osteoarthritis and  severe chondromalacia patella.   PATIENT SURVEYS:  FOTO 53 61 @ DC for knee  FOTO 53 60 @ DC for lumbar  1/7  49% knee  56% lumbar    LUMBAR ROM:   AROM eval 1/7  Flexion 90% 90%  Extension 50% 75%  Right lateral flexion 50% 75%  Left lateral flexion 50% 50%  Right rotation 75% 75%  Left rotation 75%  75%   (Blank rows = not tested)  LOWER  EXTREMITY ROM:     Active  Right eval Left eval Left 12/5 L 1/7 R 1/7  Hip flexion       Hip extension       Hip abduction       Hip adduction       Hip internal rotation       Hip external rotation 50 40     Knee flexion 125 125  125 125  Knee extension 0 -5 0 0 -2  Ankle dorsiflexion       Ankle plantarflexion       Ankle inversion       Ankle eversion        (Blank rows = not tested)  LOWER EXTREMITY MMT:    MMT Right eval Left eval Rt/Lt 12/5 R/L 1/7  Hip flexion 43.3 45.9    Hip extension      Hip abduction 38.1 33.3 39.0 39  Hip adduction      Hip internal rotation      Hip external rotation      Knee flexion      Knee extension 28.6 33.1 39.3/38.2 41.4/ 51.1  Ankle dorsiflexion      Ankle plantarflexion      Ankle inversion      Ankle eversion       (Blank rows = not tested)   GAIT: Distance walked: 41ft Assistive device utilized: None Level of assistance: Complete Independence Comments: mild flexed knee gait, no signficant deviations  TODAY'S TREATMENT:   11/22/23 Nustep Lvl 5 6 min Shuttle leg press  DL leg [id number] ea S/L shuttle press 31 lbs 2x10 Seated cable row 25# 2 x 10  Cable lat pull down 30# 2 x 10 Leg extension machine 15# 2 x 10   1/13  Nustep Lvl 5 6 min  Shuttle leg press  DL leg [id number] 1H08 ea S/L shuttle press 25lbs 2x10 SLS on airex 30s 3x; with toe tap x20 Chest supported row 3x8-10 20lbs  Gym HEP, machine set up, machine vs cable vs free weight usage  1/9  Nustep Lvl 5 6 min  Shuttle leg press  single leg 50lbs 2x10 ea Knee extension machine DL [id number] Seated T/S ext with towel roll 15x 5lbs farmer carry 20ft 3x laps  DL HR 9B28 Wall T/S ext with arms opening 2x10   1/7  Exam findings, surgical vs conservative management of knee OA  Knee extension machine DL 4X32 44WNU Calf machine DL 2V25 Hamstring curl 3G64 DL 25 lbs  Machine set up, weight selection, graded exposure  11/10/23 Nustep Lvl 5 6  min Shuttle leg press  without band today 75lbs 3x10 Lateral step down 4 inch 2 x 10 bilateral  Tandem airex pad 30s 3x  5lbs farmer carry 72ft 2x laps  Lift/chops BTB 1 x 5 bilateral each SLS 2 x 30 second holds bilateral  Treatment                            12/26:  Nustep Lvl 5 6 min  Shuttle leg press  without band today 75lbs 3x10 Toe walk 3x laps in hallway 5lbs farmer carry 58ft 3x laps (focus on upright posture and "stacking) Use of paper tape or k-tape discussed for external cuing of posture as pt asks about shoulder bracing for posture 2" box step down 2x10 Tandem airex pad 30s  3x  YTB sidestepping at knees 8ft 2x laps   Treatment                            12/19:  Nustep Lvl 5 6 min  Shuttle leg press YTB at knees 75lbs 3x10 Shuttle leg press SL 25lbs 3x10  Tandem airex pad 30s 3x  YTB sidestepping at knees 32ft 2x laps   5lb LAQ 2x10 each  Treatment                            12/17:  Nustep Lvl 5 5 min  Shuttle leg press YTB at knees 75lbs 3x10 RTB row 2x10 RTB paloff 2x10 S/L clamshell 3s holds 2x10 each Bridge 2x10 YTB at knees Supine piriformis stretch 30s 3x                                                                                                            Treatment                            12/5:  Seated piriformis stretch Hooklying core engagement, + abd press into green tband Hooklying UE press into swiss ball Seated on swiss ball ab set, + bouncing Standing lat press into swiss ball Standing ragdoll over swiss ball, head turned Lt and breathing into Rt ribcage     PATIENT EDUCATION:  Education details: anatomy, exercise progression, DOMS expectations, muscle firing,  HEP, POC Person educated: Patient Education method: Explanation, Demonstration, Tactile cues, Verbal cues, and Handouts Education comprehension: verbalized understanding, returned demonstration, verbal cues required, and tactile cues required  HOME EXERCISE  PROGRAM: Land: [email or web address]  Access Code: [email or web address] URL: [email or web address] Date: 10/03/23 Prepared by: Frankie Ziemba This aquatic home exercise program from [employer] utilizes pictures from land based exercises, but has been adapted prior to lamination and issuance.    ASSESSMENT:  CLINICAL IMPRESSION: Continued with strengthening exercises which are tolerated well. Patient with good mechanics after initial cueing. Educated on resistance possibly being different on different machines and to modify weights as needed. Performance of machines with education on mechanics, RPE, modifications.  Pt would benefit from continued skilled therapy in order to reach goals and maximize functional LE and trunk strength for prevention of further functional decline.   Initial Impression Patient is a 68 y.o. female who was seen today for physical therapy evaluation and treatment for c/c of LBP and knee pain. Pt's s/s appear consistent with mechanical LBP from history of fracture and surgery as well as significant knee OA. Pt's pain is moderately sensitive and irritable with movement. Pt's is more strength limited at this time. Plan to continue with developing aquatic HEP and land based strengthening at future sessions for knees and "core."  Pt would benefit from continued skilled therapy in order to reach goals and maximize functional LE and trunk strength for prevention of further functional decline.   OBJECTIVE IMPAIRMENTS: Abnormal  gait, decreased activity tolerance, decreased balance, decreased endurance, decreased knowledge of use of DME, decreased mobility, difficulty walking, decreased ROM, decreased strength, hypomobility, impaired flexibility, improper body mechanics, postural dysfunction, and pain.    ACTIVITY LIMITATIONS: carrying, lifting, bending, standing, squatting, stairs, transfers, bed mobility, and locomotion level   PARTICIPATION LIMITATIONS: meal prep, cleaning, laundry,  driving, shopping, and community activity and exercise   PERSONAL FACTORS: Age, Fitness, Time since onset of injury/illness/exacerbation, and 2+ co morbidities:    are also affecting patient's functional outcome.    REHAB POTENTIAL: Fair     CLINICAL DECISION MAKING: Evolving/moderate complexity   EVALUATION COMPLEXITY: Moderate     GOALS:     SHORT TERM GOALS: Target date: 10/25/2023      Pt will become independent with HEP in order to demonstrate synthesis of PT education.    Goal status: ongoing   2.  Pt will report at least 2 pt reduction on NPRS scale for pain in order to demonstrate functional improvement with household activity, self care, and ADL.    Goal status: MET   3.  Pt will be able to demonstrate/report ability to sit/stand/sleep for extended periods of time without pain in order to demonstrate functional improvement and tolerance to static positioning.    Goal status: MET       LONG TERM GOALS: Target date: 12/06/2023      Pt  will become independent with final HEP in order to demonstrate synthesis of PT education.    Goal status: ongoing   Pt will be able to demonstrate at least a 10lb increase in HHD testing for the LE in order to demonstrate improvement in LE strength for daily mobility.    Goal status: partially met   3.  Pt will be able to demonstrate/report ability to sit/stand/sleep for extended periods of time without pain in order to demonstrate functional improvement and tolerance to static positioning.     Goal status: MET   4.  Pt will score >/= 60 and 61 on FOTO to demonstrate improvement in perceived lumbar  and knee function.    Goal status: ongoing   5. Pt will be able to lift/squat/hold >25 lbs in order to demonstrate functional improvement in lumbopelvic strength for return ADL and exercise.  Goal status: ongoing     PLAN:   PT FREQUENCY: 1-2x/week   PT DURATION: 12 weeks   PLANNED INTERVENTIONS: Therapeutic exercises,  Therapeutic activity, Neuromuscular re-education, Balance training, Gait training, Patient/Family education, Self Care, Joint mobilization, Joint manipulation, Stair training, Prosthetic training, DME instructions, Aquatic Therapy, Dry Needling, Electrical stimulation, Spinal manipulation, Spinal mobilization, Moist heat, scar mobilization, Splintting, Taping, Vasopneumatic device, Traction, Ultrasound, Ionotophoresis 4mg /ml Dexamethasone , Manual therapy, and Re-evaluation   PLAN FOR NEXT SESSION:  Land based strengthening for lumbopelvic  and knee strength   Perfecto Bracket Jaimere Feutz, PT 11/22/2023, 1:47 PM

## 2023-11-28 ENCOUNTER — Encounter (HOSPITAL_BASED_OUTPATIENT_CLINIC_OR_DEPARTMENT_OTHER): Payer: Self-pay | Admitting: Physical Therapy

## 2023-11-28 ENCOUNTER — Ambulatory Visit (HOSPITAL_BASED_OUTPATIENT_CLINIC_OR_DEPARTMENT_OTHER): Payer: Medicare Other | Admitting: Physical Therapy

## 2023-11-28 DIAGNOSIS — M545 Low back pain, unspecified: Secondary | ICD-10-CM

## 2023-11-28 DIAGNOSIS — G8929 Other chronic pain: Secondary | ICD-10-CM

## 2023-11-28 DIAGNOSIS — M25562 Pain in left knee: Secondary | ICD-10-CM | POA: Diagnosis not present

## 2023-11-28 DIAGNOSIS — M25561 Pain in right knee: Secondary | ICD-10-CM

## 2023-11-28 NOTE — Therapy (Signed)
OUTPATIENT PHYSICAL THERAPY THORACOLUMBAR TREATMENT      Patient Name: Courtney Waters MRN: [id number] DOB:03-04-1956, 68 y.o., female Today's Date: 11/28/2023  END OF SESSION:  PT End of Session - 11/28/23 1301     Visit Number 14    Number of Visits 18    Date for PT Re-Evaluation 12/12/23    Authorization Type MCR    PT Start Time 1302    PT Stop Time 1345    PT Time Calculation (min) 43 min    Activity Tolerance Patient tolerated treatment well    Behavior During Therapy [facility] for tasks assessed/performed                   Past Medical History:  Diagnosis Date   Acid reflux    Arthritis    hands and neck   Back pain 12/01/1998   compression fracture L1 from sledding accident   Bladder infection    Depression    Fibromyalgia    Hearing loss in right ear    Heart murmur    Hiatal hernia 11/2017   MVP (mitral valve prolapse)    Hx   SVD (spontaneous vaginal delivery)    x 2   Past Surgical History:  Procedure Laterality Date   BASAL CELL CARCINOMA EXCISION  12/2021   nose   BUNIONECTOMY Left 10/16/2014   Dr. 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   EYE SURGERY Bilateral 2022   cataract surgery L eye 09/23/21 R eye 10/07/21   INSERTION OF MESH N/A 12/28/2021   Procedure: INSERTION OF MESH;  Surgeon: Axel Filler, MD;  Location: [facility] OR;  Service: General;  Laterality: N/A;   LAPAROSCOPIC BILATERAL SALPINGO OOPHERECTOMY Bilateral 05/13/2014   Procedure: LAPAROSCOPIC BILATERAL SALPINGO OOPHORECTOMY;  Surgeon: Annamaria Boots, MD;  Location: [facility] ORS;  Service: Gynecology;  Laterality: Bilateral;   toe nail removal Left 03/2018   TONSILLECTOMY  age 36   WISDOM TOOTH EXTRACTION  age 72   XI ROBOTIC ASSISTED HIATAL HERNIA REPAIR N/A 12/28/2021   Procedure: XI ROBOTIC ASSISTED HIATAL HERNIA REPAIR WITH MESH AND FUNDOPLICATION;  Surgeon:  Axel Filler, MD;  Location[facility] OR;  Service: General;  Laterality: N/A;   Patient Active Problem List   Diagnosis Date Noted   Basal cell carcinoma (BCC) of dorsum of nose 02/04/2022   S/P Nissen fundoplication (without gastrostomy tube) procedure 12/28/2021   Gastroesophageal reflux disease with hiatal hernia 11/12/2021   S/P BSO (bilateral salpingo-oophorectomy) 08/12/2021   Hiatal hernia 08/12/2021   Primary osteoarthritis of both knees 07/25/2017   Screening for osteoporosis 07/25/2017   DDD (degenerative disc disease), lumbar 07/25/2017   Primary osteoarthritis of both hands 01/26/2017   Primary osteoarthritis of both feet 01/26/2017   Other idiopathic scoliosis, lumbar region 01/26/2017   Fibromyalgia 02/05/2015   Postmenopausal 08/07/2014   Enthesopathy of ankle and tarsus 08/14/2007   BUNIONS, BILATERAL 08/14/2007   UNEQUAL LEG LENGTH, ACQUIRED 08/14/2007   HEEL PAIN, LEFT 08/08/2007   Osteopenia 08/08/2007    PCP: Garlan Fillers, MD   REFERRING PROVIDER: Pollyann Savoy, MD   REFERRING DIAG:  Diagnosis  M22.41,M22.42 (ICD-10-CM) - Chondromalacia of both patellae  M17.0 (ICD-10-CM) - Primary osteoarthritis of both knees  M51.369 (ICD-10-CM) - DDD (degenerative disc disease), lumbar    Rationale for Evaluation and Treatment: Rehabilitation  THERAPY DIAG:  Pain,  lumbar region  Pain in joint of right knee  Chronic pain of left knee  ONSET DATE: 2+ years   SUBJECTIVE:                                                                                                                                                                                           SUBJECTIVE STATEMENT:  Pt states  back is slightly hurting her today from peeling potatoes. Felt alright after last time, can tell this is what she needs. Water is great but she doesn't get the same resistance.   Initial Subjective Pt has chronic pain from the neck down daily. Pt has history of fibro  as well as history of compression fractures. Pt's knee pain did not respond with any major changes to hyaluronic acid injections into the knee. Pt has had improvement in pain with "Pain with Merissa" at the [facility] on Monday, Wed, and Fri. Pt does have upper neck and shoulder pain on the R. Pt notes that the knee has previously "given" out before with standing but has not happened recently. Pt has pain in multiple joint sites, especially into L hand/thumb. Pt does find that stress does effect her pain. Denies red flags.   Pt has had not had balance issues or falls.   PERTINENT HISTORY:  Previous L1 compressions fracture 2001  PAIN:  Are you having pain? Yes: NPRS scale: 0/10 at rest today Pain location: knees Pain description: varying- can be dull, ache, sharp Aggravating factors: slightly bent/flexed position at back Relieving factors: aquatic exercise, voltaren, heat, massage gun   PRECAUTIONS: None  RED FLAGS: None   WEIGHT BEARING RESTRICTIONS: No  FALLS:  Has patient fallen in last 6 months? 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  DIAGNOSTIC FINDINGS:  L knee xray  Impression: These findings are consistent with moderate chondromalacia  patella .   R knee xray Impression: These findings are consistent with mild osteoarthritis and  severe chondromalacia patella.   PATIENT SURVEYS:  FOTO 53 61 @ DC for knee  FOTO 53 60 @ DC for lumbar  1/7  49% knee  56% lumbar    LUMBAR ROM:   AROM eval 1/7  Flexion 90% 90%  Extension 50% 75%  Right lateral flexion 50% 75%  Left lateral flexion 50% 50%  Right  rotation 75% 75%  Left  rotation 75% 75%   (Blank rows = not tested)  LOWER EXTREMITY ROM:     Active  Right eval Left eval Left 12/5 L 1/7 R 1/7  Hip flexion       Hip extension       Hip abduction       Hip adduction       Hip internal rotation       Hip external rotation 50 40     Knee flexion 125 125  125 125  Knee extension 0 -5 0 0 -2  Ankle dorsiflexion       Ankle plantarflexion       Ankle inversion       Ankle eversion        (Blank rows = not tested)  LOWER EXTREMITY MMT:    MMT Right eval Left eval Rt/Lt 12/5 R/L 1/7  Hip flexion 43.3 45.9    Hip extension      Hip abduction 38.1 33.3 39.0 39  Hip adduction      Hip internal rotation      Hip external rotation      Knee flexion      Knee extension 28.6 33.1 39.3/38.2 41.4/ 51.1  Ankle dorsiflexion      Ankle plantarflexion      Ankle inversion      Ankle eversion       (Blank rows = not tested)   GAIT: Distance walked: 20ft Assistive device utilized: None Level of assistance: Complete Independence Comments: mild flexed knee gait, no signficant deviations  TODAY'S TREATMENT:   11/28/23 Nustep Lvl 6, 6 min  Shuttle leg press  DL leg [id number] [id number] ea S/L shuttle press 31 lbs 3x10 Cybex leg press 70# 1 x 10, 75 1 x 10 Cable lat pull down 30# 2 x 10 Cable chop 10# 1 x 10   11/22/23 Nustep Lvl 5 6 min Shuttle leg press  DL leg [id number] 9B28 ea S/L shuttle press 31 lbs 2x10 Seated cable row 25# 2 x 10  Cable lat pull down 30# 2 x 10 Leg extension machine 15# 2 x 10   1/13  Nustep Lvl 5 6 min  Shuttle leg press  DL leg [id number] 0N02 ea S/L shuttle press 25lbs 2x10 SLS on airex 30s 3x; with toe tap x20 Chest supported row 3x8-10 20lbs  Gym HEP, machine set up, machine vs cable vs free weight usage  1/9  Nustep Lvl 5 6 min  Shuttle leg press  single leg 50lbs 2x10 ea Knee extension machine DL [id number] Seated T/S ext with towel roll 15x 5lbs farmer carry 40ft 3x laps  DL HR 3K74 Wall T/S ext with arms  opening 2x10   1/7  Exam findings, surgical vs conservative management of knee OA  Knee extension machine DL 2V95 [id number] Calf machine DL 6E33 Hamstring curl 2R51 DL 25 lbs  Machine set up, weight selection, graded exposure  11/10/23 Nustep Lvl 5 6 min Shuttle leg press  without band today 75lbs 3x10 Lateral step down 4 inch 2 x 10 bilateral  Tandem airex pad 30s 3x  5lbs farmer carry 22ft 2x laps  Lift/chops BTB 1 x 5 bilateral each SLS 2 x 30 second holds bilateral  Treatment                            12/26:  Nustep Lvl 5 6 min  Shuttle leg press  without band today 75lbs 3x10 Toe walk 3x laps in hallway 5lbs farmer carry 11ft 3x laps (focus on upright posture and "stacking) Use of paper tape or k-tape discussed for external cuing of posture as pt asks about shoulder bracing for posture 2" box step down 2x10 Tandem airex pad 30s 3x  YTB sidestepping at knees 43ft 2x laps   Treatment                            12/19:  Nustep Lvl 5 6 min  Shuttle leg press YTB at knees 75lbs 3x10 Shuttle leg press SL 25lbs 3x10  Tandem airex pad 30s 3x  YTB sidestepping at knees 80ft 2x laps   5lb LAQ 2x10 each  Treatment                            12/17:  Nustep Lvl 5 5 min  Shuttle leg press YTB at knees 75lbs 3x10 RTB row 2x10 RTB paloff 2x10 S/L clamshell 3s holds 2x10 each Bridge 2x10 YTB at knees Supine piriformis stretch 30s 3x                                                                                                            Treatment                            12/5:  Seated piriformis stretch Hooklying core engagement, + abd press into green tband Hooklying UE press into swiss ball Seated on swiss ball ab set, + bouncing Standing lat press into swiss ball Standing ragdoll over swiss ball, head turned Lt and breathing into Rt ribcage     PATIENT EDUCATION:  Education details: anatomy, exercise progression, DOMS expectations, muscle firing,  HEP,  POC Person educated: Patient Education method: Explanation, Demonstration, Tactile cues, Verbal cues, and Handouts Education comprehension: verbalized understanding, returned demonstration, verbal cues required, and tactile cues required  HOME EXERCISE PROGRAM: Land: [email or web address]  Access Code: [email or web address] URL: [email or web address] Date: 10/03/23 Prepared by: Geni Bers This aquatic home exercise program from [employer] utilizes pictures from land based exercises, but has been adapted prior to lamination and issuance.    ASSESSMENT:  CLINICAL IMPRESSION: Continued with strengthening exercises which are tolerated well. Continued education on dosing of exercises. Good mechanics today. Pt would benefit from continued skilled therapy in order to reach goals and maximize functional LE and trunk strength for prevention of further functional decline.   Initial Impression Patient is a 68 y.o. female who was seen today for physical therapy evaluation and treatment for c/c of LBP and knee pain. Pt's s/s appear consistent with mechanical LBP from history of fracture and surgery as well as significant knee OA. Pt's pain is moderately sensitive and irritable with movement. Pt's is more strength limited at this time. Plan to continue with developing aquatic HEP and land based strengthening at future sessions for knees  and "core."  Pt would benefit from continued skilled therapy in order to reach goals and maximize functional LE and trunk strength for prevention of further functional decline.   OBJECTIVE IMPAIRMENTS: Abnormal gait, decreased activity tolerance, decreased balance, decreased endurance, decreased knowledge of use of DME, decreased mobility, difficulty walking, decreased ROM, decreased strength, hypomobility, impaired flexibility, improper body mechanics, postural dysfunction, and pain.    ACTIVITY LIMITATIONS: carrying, lifting, bending, standing, squatting, stairs, transfers, bed  mobility, and locomotion level   PARTICIPATION LIMITATIONS: meal prep, cleaning, laundry, driving, shopping, and community activity and exercise   PERSONAL FACTORS: Age, Fitness, Time since onset of injury/illness/exacerbation, and 2+ co morbidities:    are also affecting patient's functional outcome.    REHAB POTENTIAL: Fair     CLINICAL DECISION MAKING: Evolving/moderate complexity   EVALUATION COMPLEXITY: Moderate     GOALS:     SHORT TERM GOALS: Target date: 10/25/2023      Pt will become independent with HEP in order to demonstrate synthesis of PT education.    Goal status: ongoing   2.  Pt will report at least 2 pt reduction on NPRS scale for pain in order to demonstrate functional improvement with household activity, self care, and ADL.    Goal status: MET   3.  Pt will be able to demonstrate/report ability to sit/stand/sleep for extended periods of time without pain in order to demonstrate functional improvement and tolerance to static positioning.    Goal status: MET       LONG TERM GOALS: Target date: 12/06/2023      Pt  will become independent with final HEP in order to demonstrate synthesis of PT education.    Goal status: ongoing   Pt will be able to demonstrate at least a 10lb increase in HHD testing for the LE in order to demonstrate improvement in LE strength for daily mobility.    Goal status: partially met   3.  Pt will be able to demonstrate/report ability to sit/stand/sleep for extended periods of time without pain in order to demonstrate functional improvement and tolerance to static positioning.     Goal status: MET   4.  Pt will score >/= 60 and 61 on FOTO to demonstrate improvement in perceived lumbar  and knee function.    Goal status: ongoing   5. Pt will be able to lift/squat/hold >25 lbs in order to demonstrate functional improvement in lumbopelvic strength for return ADL and exercise.  Goal status: ongoing     PLAN:   PT  FREQUENCY: 1-2x/week   PT DURATION: 12 weeks   PLANNED INTERVENTIONS: Therapeutic exercises, Therapeutic activity, Neuromuscular re-education, Balance training, Gait training, Patient/Family education, Self Care, Joint mobilization, Joint manipulation, Stair training, Prosthetic training, DME instructions, Aquatic Therapy, Dry Needling, Electrical stimulation, Spinal manipulation, Spinal mobilization, Moist heat, scar mobilization, Splintting, Taping, Vasopneumatic device, Traction, Ultrasound, Ionotophoresis 4mg /ml Dexamethasone, Manual therapy, and Re-evaluation   PLAN FOR NEXT SESSION:  Land based strengthening for lumbopelvic  and knee strength   Reola Mosher Ixel Boehning, PT 11/28/2023, 1:46 PM

## 2023-11-29 ENCOUNTER — Encounter (HOSPITAL_BASED_OUTPATIENT_CLINIC_OR_DEPARTMENT_OTHER): Payer: Self-pay | Admitting: Physical Therapy

## 2023-11-29 ENCOUNTER — Ambulatory Visit (HOSPITAL_BASED_OUTPATIENT_CLINIC_OR_DEPARTMENT_OTHER): Payer: Self-pay | Admitting: Physical Therapy

## 2023-11-29 DIAGNOSIS — M25561 Pain in right knee: Secondary | ICD-10-CM | POA: Diagnosis not present

## 2023-11-29 DIAGNOSIS — M545 Low back pain, unspecified: Secondary | ICD-10-CM

## 2023-11-29 DIAGNOSIS — G8929 Other chronic pain: Secondary | ICD-10-CM

## 2023-11-29 DIAGNOSIS — M25562 Pain in left knee: Secondary | ICD-10-CM | POA: Diagnosis not present

## 2023-11-29 NOTE — Therapy (Signed)
OUTPATIENT PHYSICAL THERAPY THORACOLUMBAR TREATMENT      Patient Name: Courtney Waters MRN: [id number] DOB:1955-12-18, 68 y.o., female Today's Date: 11/29/2023  END OF SESSION:  PT End of Session - 11/29/23 1452     Visit Number 15    Number of Visits 18    Date for PT Re-Evaluation 12/12/23    Authorization Type MCR    PT Start Time 1445    PT Stop Time 1519    PT Time Calculation (min) 34 min    Activity Tolerance Patient tolerated treatment well    Behavior During Therapy [facility] for tasks assessed/performed                    Past Medical History:  Diagnosis Date   Acid reflux    Arthritis    hands and neck   Back pain 12/01/1998   compression fracture L1 from sledding accident   Bladder infection    Depression    Fibromyalgia    Hearing loss in right ear    Heart murmur    Hiatal hernia 11/2017   MVP (mitral valve prolapse)    Hx   SVD (spontaneous vaginal delivery)    x 2   Past Surgical History:  Procedure Laterality Date   BASAL CELL CARCINOMA EXCISION  12/2021   nose   BUNIONECTOMY Left 10/16/2014   Dr. 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   EYE SURGERY Bilateral 2022   cataract surgery L eye 09/23/21 R eye 10/07/21   INSERTION OF MESH N/A 12/28/2021   Procedure: INSERTION OF MESH;  Surgeon: Courtney Filler, MD;  Location: [facility] OR;  Service: General;  Laterality: N/A;   LAPAROSCOPIC BILATERAL SALPINGO OOPHERECTOMY Bilateral 05/13/2014   Procedure: LAPAROSCOPIC BILATERAL SALPINGO OOPHORECTOMY;  Surgeon: Courtney Boots, MD;  Location: [facility] ORS;  Service: Gynecology;  Laterality: Bilateral;   toe nail removal Left 03/2018   TONSILLECTOMY  age 47   WISDOM TOOTH EXTRACTION  age 58   XI ROBOTIC ASSISTED HIATAL HERNIA REPAIR N/A 12/28/2021   Procedure: XI ROBOTIC ASSISTED HIATAL HERNIA REPAIR WITH MESH AND FUNDOPLICATION;  Surgeon:  Courtney Filler, MD;  Location: [facility] OR;  Service: General;  Laterality: N/A;   Patient Active Problem List   Diagnosis Date Noted   Basal cell carcinoma (BCC) of dorsum of nose 02/04/2022   S/P Nissen fundoplication (without gastrostomy tube) procedure 12/28/2021   Gastroesophageal reflux disease with hiatal hernia 11/12/2021   S/P BSO (bilateral salpingo-oophorectomy) 08/12/2021   Hiatal hernia 08/12/2021   Primary osteoarthritis of both knees 07/25/2017   Screening for osteoporosis 07/25/2017   DDD (degenerative disc disease), lumbar 07/25/2017   Primary osteoarthritis of both hands 01/26/2017   Primary osteoarthritis of both feet 01/26/2017   Other idiopathic scoliosis, lumbar region 01/26/2017   Fibromyalgia 02/05/2015   Postmenopausal 08/07/2014   Enthesopathy of ankle and tarsus 08/14/2007   BUNIONS, BILATERAL 08/14/2007   UNEQUAL LEG LENGTH, ACQUIRED 08/14/2007   HEEL PAIN, LEFT 08/08/2007   Osteopenia 08/08/2007    PCP: Courtney Fillers, MD   REFERRING PROVIDER: Pollyann Savoy, MD   REFERRING DIAG:  Diagnosis  M22.41,M22.42 (ICD-10-CM) - Chondromalacia of both patellae  M17.0 (ICD-10-CM) - Primary osteoarthritis of both knees  M51.369 (ICD-10-CM) - DDD (degenerative disc disease), lumbar    Rationale for Evaluation and Treatment: Rehabilitation  THERAPY DIAG:  Pain, lumbar region  Pain in joint of right knee  Chronic pain of left knee  ONSET DATE: 2+ years   SUBJECTIVE:                                                                                                                                                                                           SUBJECTIVE STATEMENT:  Pt states she was mildly sore into the leg more after last session but no increase in pain.   Initial Subjective Pt has chronic pain from the neck down daily. Pt has history of fibro as well as history of compression fractures. Pt's knee pain did not respond with any major  changes to hyaluronic acid injections into the knee. Pt has had improvement in pain with "Pain with Courtney Waters" at the [place] on Monday, Wed, and Fri. Pt does have upper neck and shoulder pain on the R. Pt notes that the knee has previously "given" out before with standing but has not happened recently. Pt has pain in multiple joint sites, especially into L hand/thumb. Pt does find that stress does effect her pain. Denies red flags.   Pt has had not had balance issues or falls.   PERTINENT HISTORY:  Previous L1 compressions fracture 2001  PAIN:  Are you having pain? Yes: NPRS scale: 0/10 at rest today Pain location: knees Pain description: varying- can be dull, ache, sharp Aggravating factors: slightly bent/flexed position at back Relieving factors: aquatic exercise, voltaren, heat, massage gun   PRECAUTIONS: None  RED FLAGS: None   WEIGHT BEARING RESTRICTIONS: No  FALLS:  Has patient fallen in last 6 months? 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  DIAGNOSTIC FINDINGS:  L knee xray  Impression: These findings are consistent with moderate chondromalacia  patella .   R knee xray Impression: These findings are consistent with mild osteoarthritis and  severe chondromalacia patella.   PATIENT SURVEYS:  FOTO 53 61 @ DC for knee  FOTO 53 60 @ DC for lumbar  1/7  49% knee  56% lumbar    LUMBAR ROM:   AROM eval 1/7  Flexion 90% 90%  Extension 50% 75%  Right lateral flexion 50% 75%  Left lateral flexion 50% 50%  Right rotation 75% 75%  Left rotation 75% 75%   (Blank rows = not tested)  LOWER EXTREMITY ROM:     Active  Right eval  Left eval Left 12/5 L 1/7 R 1/7  Hip flexion       Hip extension       Hip abduction       Hip adduction       Hip internal rotation       Hip external rotation 50 40     Knee flexion 125 125  125 125  Knee extension 0 -5 0 0 -2  Ankle dorsiflexion       Ankle plantarflexion       Ankle inversion       Ankle eversion        (Blank rows = not tested)  LOWER EXTREMITY MMT:    MMT Right eval Left eval Rt/Lt 12/5 R/L 1/7  Hip flexion 43.3 45.9    Hip extension      Hip abduction 38.1 33.3 39.0 39  Hip adduction      Hip internal rotation      Hip external rotation      Knee flexion      Knee extension 28.6 33.1 39.3/38.2 41.4/ 51.1  Ankle dorsiflexion      Ankle plantarflexion      Ankle inversion      Ankle eversion       (Blank rows = not tested)   GAIT: Distance walked: 12ft Assistive device utilized: None Level of assistance: Complete Independence Comments: mild flexed knee gait, no signficant deviations  TODAY'S TREATMENT:    1/22  Nustep Lvl 6, 6 min   Airex tandem balance 30s 3x each Airex march 2x20  Airex tandem with 5lb weight pass 3x10 each  GTB shoulder extension 3x10 GTB paloff 3x10  11/28/23 Nustep Lvl 6, 6 min  Shuttle leg press  DL leg [id number] ea S/L shuttle press 31 lbs 3x10 Cybex leg press 70# 1 x 10, 75 1 x 10 Cable lat pull down 30# 2 x 10 Cable chop 10# 1 x 10   11/22/23 Nustep Lvl 5 6 min Shuttle leg press  DL leg [id number] [id number] ea S/L shuttle press 31 lbs 2x10 Seated cable row 25# 2 x 10  Cable lat pull down 30# 2 x 10 Leg extension machine 15# 2 x 10   1/13  Nustep Lvl 5 6 min  Shuttle leg press  DL leg [id number] 4Q59 ea S/L shuttle press 25lbs 2x10 SLS on airex 30s 3x; with toe tap x20 Chest supported row 3x8-10 20lbs  Gym HEP, machine set up, machine vs cable vs free weight usage  1/9  Nustep Lvl 5 6 min  Shuttle leg press  single leg 50lbs 2x10 ea Knee extension machine DL [id number] Seated T/S ext with  towel roll 15x 5lbs farmer carry 64ft 3x laps  DL HR 3I95 Wall T/S ext with arms opening 2x10   1/7  Exam findings, surgical vs conservative management of knee OA  Knee extension machine DL 1O84 16SAY Calf machine DL 3K16 Hamstring curl 0F09 DL 25 lbs  Machine set up, weight selection, graded exposure  11/10/23 Nustep Lvl 5 6 min Shuttle leg press  without band today 75lbs 3x10 Lateral step down 4 inch 2 x 10 bilateral  Tandem airex pad 30s 3x  5lbs farmer carry 51ft 2x laps  Lift/chops BTB 1 x 5 bilateral each SLS 2 x 30 second holds bilateral  Treatment  12/26:  Nustep Lvl 5 6 min  Shuttle leg press  without band today 75lbs 3x10 Toe walk 3x laps in hallway 5lbs farmer carry 41ft 3x laps (focus on upright posture and "stacking) Use of paper tape or k-tape discussed for external cuing of posture as pt asks about shoulder bracing for posture 2" box step down 2x10 Tandem airex pad 30s 3x  YTB sidestepping at knees 29ft 2x laps   Treatment                            12/19:  Nustep Lvl 5 6 min  Shuttle leg press YTB at knees 75lbs 3x10 Shuttle leg press SL 25lbs 3x10  Tandem airex pad 30s 3x  YTB sidestepping at knees 97ft 2x laps   5lb LAQ 2x10 each  Treatment                            12/17:  Nustep Lvl 5 5 min  Shuttle leg press YTB at knees 75lbs 3x10 RTB row 2x10 RTB paloff 2x10 S/L clamshell 3s holds 2x10 each Bridge 2x10 YTB at knees Supine piriformis stretch 30s 3x                                                                                                            Treatment                            12/5:  Seated piriformis stretch Hooklying core engagement, + abd press into green tband Hooklying UE press into swiss ball Seated on swiss ball ab set, + bouncing Standing lat press into swiss ball Standing ragdoll over swiss ball, head turned Lt and breathing into Rt ribcage     PATIENT EDUCATION:  Education  details: anatomy, exercise progression, DOMS expectations, muscle firing,  HEP, POC Person educated: Patient Education method: Explanation, Demonstration, Tactile cues, Verbal cues, and Handouts Education comprehension: verbalized understanding, returned demonstration, verbal cues required, and tactile cues required  HOME EXERCISE PROGRAM: Land: [email or web address]  Access Code: [email or web address] URL: [email or web address] Date: 10/03/23 Prepared by: Geni Bers This aquatic home exercise program from [employer] utilizes pictures from land based exercises, but has been adapted prior to lamination and issuance.    ASSESSMENT:  CLINICAL IMPRESSION: Session today focused on progressing current home program for performance inside her home as pt has difficulty getting into the gym outside of her pool sessions. Pt able to progress with resistance of HEP as well as incorporate standing/stability based exercise. No pain noted during session but pt does fatigue quickly with added resistance. Plan to use last two sessions to finalize HEPs (1 gym, 1 home) so pt has exercise program options moving forward if she is in pain or fatigued after her pool exercise sessions.  Pt would benefit from continued skilled therapy in order to reach goals and maximize functional LE and trunk strength for prevention of further functional decline.  Initial Impression Patient is a 68 y.o. female who was seen today for physical therapy evaluation and treatment for c/c of LBP and knee pain. Pt's s/s appear consistent with mechanical LBP from history of fracture and surgery as well as significant knee OA. Pt's pain is moderately sensitive and irritable with movement. Pt's is more strength limited at this time. Plan to continue with developing aquatic HEP and land based strengthening at future sessions for knees and "core."  Pt would benefit from continued skilled therapy in order to reach goals and maximize functional LE and trunk  strength for prevention of further functional decline.   OBJECTIVE IMPAIRMENTS: Abnormal gait, decreased activity tolerance, decreased balance, decreased endurance, decreased knowledge of use of DME, decreased mobility, difficulty walking, decreased ROM, decreased strength, hypomobility, impaired flexibility, improper body mechanics, postural dysfunction, and pain.    ACTIVITY LIMITATIONS: carrying, lifting, bending, standing, squatting, stairs, transfers, bed mobility, and locomotion level   PARTICIPATION LIMITATIONS: meal prep, cleaning, laundry, driving, shopping, and community activity and exercise   PERSONAL FACTORS: Age, Fitness, Time since onset of injury/illness/exacerbation, and 2+ co morbidities:    are also affecting patient's functional outcome.    REHAB POTENTIAL: Fair     CLINICAL DECISION MAKING: Evolving/moderate complexity   EVALUATION COMPLEXITY: Moderate     GOALS:     SHORT TERM GOALS: Target date: 10/25/2023      Pt will become independent with HEP in order to demonstrate synthesis of PT education.    Goal status: ongoing   2.  Pt will report at least 2 pt reduction on NPRS scale for pain in order to demonstrate functional improvement with household activity, self care, and ADL.    Goal status: MET   3.  Pt will be able to demonstrate/report ability to sit/stand/sleep for extended periods of time without pain in order to demonstrate functional improvement and tolerance to static positioning.    Goal status: MET       LONG TERM GOALS: Target date: 12/06/2023      Pt  will become independent with final HEP in order to demonstrate synthesis of PT education.    Goal status: ongoing   Pt will be able to demonstrate at least a 10lb increase in HHD testing for the LE in order to demonstrate improvement in LE strength for daily mobility.    Goal status: partially met   3.  Pt will be able to demonstrate/report ability to sit/stand/sleep for extended  periods of time without pain in order to demonstrate functional improvement and tolerance to static positioning.     Goal status: MET   4.  Pt will score >/= 60 and 61 on FOTO to demonstrate improvement in perceived lumbar  and knee function.    Goal status: ongoing   5. Pt will be able to lift/squat/hold >25 lbs in order to demonstrate functional improvement in lumbopelvic strength for return ADL and exercise.  Goal status: ongoing     PLAN:   PT FREQUENCY: 1-2x/week   PT DURATION: 12 weeks   PLANNED INTERVENTIONS: Therapeutic exercises, Therapeutic activity, Neuromuscular re-education, Balance training, Gait training, Patient/Family education, Self Care, Joint mobilization, Joint manipulation, Stair training, Prosthetic training, DME instructions, Aquatic Therapy, Dry Needling, Electrical stimulation, Spinal manipulation, Spinal mobilization, Moist heat, scar mobilization, Splintting, Taping, Vasopneumatic device, Traction, Ultrasound, Ionotophoresis 4mg /ml Dexamethasone, Manual therapy, and Re-evaluation   PLAN FOR NEXT SESSION:  Land based strengthening for lumbopelvic  and knee strength   Zebedee Iba, PT 11/29/2023, 3:31 PM

## 2023-11-30 ENCOUNTER — Ambulatory Visit (HOSPITAL_BASED_OUTPATIENT_CLINIC_OR_DEPARTMENT_OTHER): Payer: Medicare Other | Admitting: Physical Therapy

## 2023-12-01 ENCOUNTER — Encounter (HOSPITAL_BASED_OUTPATIENT_CLINIC_OR_DEPARTMENT_OTHER): Payer: Medicare Other | Admitting: Physical Therapy

## 2023-12-04 ENCOUNTER — Encounter (HOSPITAL_BASED_OUTPATIENT_CLINIC_OR_DEPARTMENT_OTHER): Payer: Self-pay | Admitting: Obstetrics & Gynecology

## 2023-12-05 ENCOUNTER — Encounter (HOSPITAL_BASED_OUTPATIENT_CLINIC_OR_DEPARTMENT_OTHER): Payer: Self-pay | Admitting: Physical Therapy

## 2023-12-05 ENCOUNTER — Ambulatory Visit (HOSPITAL_BASED_OUTPATIENT_CLINIC_OR_DEPARTMENT_OTHER): Payer: Medicare Other | Admitting: Physical Therapy

## 2023-12-05 DIAGNOSIS — M25561 Pain in right knee: Secondary | ICD-10-CM | POA: Diagnosis not present

## 2023-12-05 DIAGNOSIS — M545 Low back pain, unspecified: Secondary | ICD-10-CM

## 2023-12-05 DIAGNOSIS — G8929 Other chronic pain: Secondary | ICD-10-CM | POA: Diagnosis not present

## 2023-12-05 DIAGNOSIS — M25562 Pain in left knee: Secondary | ICD-10-CM | POA: Diagnosis not present

## 2023-12-05 NOTE — Therapy (Signed)
OUTPATIENT PHYSICAL THERAPY THORACOLUMBAR TREATMENT      Patient Name: Courtney Waters MRN: [id number] DOB:January 25, 1956, 68 y.o., female Today's Date: 12/05/2023  END OF SESSION:  PT End of Session - 12/05/23 1351     Visit Number 16    Number of Visits 18    Date for PT Re-Evaluation 12/12/23    Authorization Type MCR    PT Start Time 1350    PT Stop Time 1431    PT Time Calculation (min) 41 min    Activity Tolerance Patient tolerated treatment well    Behavior During Therapy [facility] for tasks assessed/performed                    Past Medical History:  Diagnosis Date   Acid reflux    Arthritis    hands and neck   Back pain 12/01/1998   compression fracture L1 from sledding accident   Bladder infection    Depression    Fibromyalgia    Hearing loss in right ear    Heart murmur    Hiatal hernia 11/2017   MVP (mitral valve prolapse)    Hx   SVD (spontaneous vaginal delivery)    x 2   Past Surgical History:  Procedure Laterality Date   BASAL CELL CARCINOMA EXCISION  12/2021   nose   BUNIONECTOMY Left 10/16/2014   Dr. 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   EYE SURGERY Bilateral 2022   cataract surgery L eye 09/23/21 R eye 10/07/21   INSERTION OF MESH N/A 12/28/2021   Procedure: INSERTION OF MESH;  Surgeon: Axel Filler, MD;  Location: [facility] OR;  Service: General;  Laterality: N/A;   LAPAROSCOPIC BILATERAL SALPINGO OOPHERECTOMY Bilateral 05/13/2014   Procedure: LAPAROSCOPIC BILATERAL SALPINGO OOPHORECTOMY;  Surgeon: Annamaria Boots, MD;  Location: [facility] ORS;  Service: Gynecology;  Laterality: Bilateral;   toe nail removal Left 03/2018   TONSILLECTOMY  age 38   WISDOM TOOTH EXTRACTION  age 39   XI ROBOTIC ASSISTED HIATAL HERNIA REPAIR N/A 12/28/2021   Procedure: XI ROBOTIC ASSISTED HIATAL HERNIA REPAIR WITH MESH AND FUNDOPLICATION;  Surgeon:  Axel Filler, MD;  Location: [facility] OR;  Service: General;  Laterality: N/A;   Patient Active Problem List   Diagnosis Date Noted   Basal cell carcinoma (BCC) of dorsum of nose 02/04/2022   S/P Nissen fundoplication (without gastrostomy tube) procedure 12/28/2021   Gastroesophageal reflux disease with hiatal hernia 11/12/2021   S/P BSO (bilateral salpingo-oophorectomy) 08/12/2021   Hiatal hernia 08/12/2021   Primary osteoarthritis of both knees 07/25/2017   Screening for osteoporosis 07/25/2017   DDD (degenerative disc disease), lumbar 07/25/2017   Primary osteoarthritis of both hands 01/26/2017   Primary osteoarthritis of both feet 01/26/2017   Other idiopathic scoliosis, lumbar region 01/26/2017   Fibromyalgia 02/05/2015   Postmenopausal 08/07/2014   Enthesopathy of ankle and tarsus 08/14/2007   BUNIONS, BILATERAL 08/14/2007   UNEQUAL LEG LENGTH, ACQUIRED 08/14/2007   HEEL PAIN, LEFT 08/08/2007   Osteopenia 08/08/2007    PCP: Garlan Fillers, MD   REFERRING PROVIDER: Pollyann Savoy, MD   REFERRING DIAG:  Diagnosis  M22.41,M22.42 (ICD-10-CM) - Chondromalacia of both patellae  M17.0 (ICD-10-CM) - Primary osteoarthritis of both knees  M51.369 (ICD-10-CM) - DDD (degenerative disc disease), lumbar    Rationale for Evaluation and Treatment: Rehabilitation  THERAPY DIAG:  Pain, lumbar region  Pain in joint of right knee  Chronic pain of left knee  ONSET DATE: 2+ years   SUBJECTIVE:                                                                                                                                                                                           SUBJECTIVE STATEMENT:  Pt states she is feeling good today. Was sick toward the end of last week.   Initial Subjective Pt has chronic pain from the neck down daily. Pt has history of fibro as well as history of compression fractures. Pt's knee pain did not respond with any major changes to hyaluronic  acid injections into the knee. Pt has had improvement in pain with "Pain with Jaima" at the [place] on Monday, Wed, and Fri. Pt does have upper neck and shoulder pain on the R. Pt notes that the knee has previously "given" out before with standing but has not happened recently. Pt has pain in multiple joint sites, especially into L hand/thumb. Pt does find that stress does effect her pain. Denies red flags.   Pt has had not had balance issues or falls.   PERTINENT HISTORY:  Previous L1 compressions fracture 2001  PAIN:  Are you having pain? Yes: NPRS scale: 0/10 at rest today Pain location: knees Pain description: varying- can be dull, ache, sharp Aggravating factors: slightly bent/flexed position at back Relieving factors: aquatic exercise, voltaren, heat, massage gun   PRECAUTIONS: None  RED FLAGS: None   WEIGHT BEARING RESTRICTIONS: No  FALLS:  Has patient fallen in last 6 months? 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  DIAGNOSTIC FINDINGS:  L knee xray  Impression: These findings are consistent with moderate chondromalacia  patella .   R knee xray Impression: These findings are consistent with mild osteoarthritis and  severe chondromalacia patella.   PATIENT SURVEYS:  FOTO 53 61 @ DC for knee  FOTO 53 60 @ DC for lumbar  1/7  49% knee  56% lumbar    LUMBAR ROM:   AROM eval 1/7  Flexion 90% 90%  Extension 50% 75%  Right lateral flexion 50% 75%  Left lateral flexion 50% 50%  Right rotation 75% 75%  Left rotation 75% 75%   (Blank rows = not tested)  LOWER EXTREMITY  ROM:     Active  Right eval Left eval Left 12/5  L 1/7 R 1/7  Hip flexion       Hip extension       Hip abduction       Hip adduction       Hip internal rotation       Hip external rotation 50 40     Knee flexion 125 125  125 125  Knee extension 0 -5 0 0 -2  Ankle dorsiflexion       Ankle plantarflexion       Ankle inversion       Ankle eversion        (Blank rows = not tested)  LOWER EXTREMITY MMT:    MMT Right eval Left eval Rt/Lt 12/5 R/L 1/7  Hip flexion 43.3 45.9    Hip extension      Hip abduction 38.1 33.3 39.0 39  Hip adduction      Hip internal rotation      Hip external rotation      Knee flexion      Knee extension 28.6 33.1 39.3/38.2 41.4/ 51.1  Ankle dorsiflexion      Ankle plantarflexion      Ankle inversion      Ankle eversion       (Blank rows = not tested)   GAIT: Distance walked: 61ft Assistive device utilized: None Level of assistance: Complete Independence Comments: mild flexed knee gait, no signficant deviations  TODAY'S TREATMENT:   12/05/23 Shuttle leg press  DL leg [id number] [id number] ea S/L shuttle press 31 lbs 3x10 Seated cable row 25# 2 x 10  Cable lat pull down 30# 2 x 10 Leg extension machine 20# 2 x 10  Cable chop 10# 2x 10  Hamstring curl machine 25# 2 x 10   1/22  Nustep Lvl 6, 6 min   Airex tandem balance 30s 3x each Airex march 2x20  Airex tandem with 5lb weight pass 3x10 each  GTB shoulder extension 3x10 GTB paloff 3x10  11/28/23 Nustep Lvl 6, 6 min  Shuttle leg press  DL leg 130QMV 7Q46 ea S/L shuttle press 31 lbs 3x10 Cybex leg press 70# 1 x 10, 75 1 x 10 Cable lat pull down 30# 2 x 10 Cable chop 10# 1 x 10   11/22/23 Nustep Lvl 5 6 min Shuttle leg press  DL leg [id number] 8U13 ea S/L shuttle press 31 lbs 2x10 Seated cable row 25# 2 x 10  Cable lat pull down 30# 2 x 10 Leg extension machine 15# 2 x 10   1/13  Nustep Lvl 5 6 min  Shuttle leg press  DL leg 244WNU 2V25 ea S/L shuttle press 25lbs 2x10 SLS on airex 30s 3x; with toe tap x20 Chest supported row  3x8-10 20lbs  Gym HEP, machine set up, machine vs cable vs free weight usage  1/9  Nustep Lvl 5 6 min  Shuttle leg press  single leg 50lbs 2x10 ea Knee extension machine DL 3G64 40HKV Seated T/S ext with towel roll 15x 5lbs farmer carry 74ft 3x laps  DL HR 4Q59 Wall T/S ext with arms opening 2x10   1/7  Exam findings, surgical vs conservative management of knee OA  Knee extension machine DL 5G38 75IEP Calf machine DL 3I95 Hamstring curl 1O84 DL 25 lbs  Machine set up, weight selection, graded exposure  11/10/23 Nustep Lvl 5 6 min Shuttle leg press  without band today 75lbs 3x10 Lateral  step down 4 inch 2 x 10 bilateral  Tandem airex pad 30s 3x  5lbs farmer carry 78ft 2x laps  Lift/chops BTB 1 x 5 bilateral each SLS 2 x 30 second holds bilateral  Treatment                            12/26:  Nustep Lvl 5 6 min  Shuttle leg press  without band today 75lbs 3x10 Toe walk 3x laps in hallway 5lbs farmer carry 1ft 3x laps (focus on upright posture and "stacking) Use of paper tape or k-tape discussed for external cuing of posture as pt asks about shoulder bracing for posture 2" box step down 2x10 Tandem airex pad 30s 3x  YTB sidestepping at knees 1ft 2x laps   Treatment                            12/19:  Nustep Lvl 5 6 min  Shuttle leg press YTB at knees 75lbs 3x10 Shuttle leg press SL 25lbs 3x10  Tandem airex pad 30s 3x  YTB sidestepping at knees 66ft 2x laps   5lb LAQ 2x10 each  Treatment                            12/17:  Nustep Lvl 5 5 min  Shuttle leg press YTB at knees 75lbs 3x10 RTB row 2x10 RTB paloff 2x10 S/L clamshell 3s holds 2x10 each Bridge 2x10 YTB at knees Supine piriformis stretch 30s 3x                                                                                                            Treatment                            12/5:  Seated piriformis stretch Hooklying core engagement, + abd press into green tband Hooklying UE press into  swiss ball Seated on swiss ball ab set, + bouncing Standing lat press into swiss ball Standing ragdoll over swiss ball, head turned Lt and breathing into Rt ribcage     PATIENT EDUCATION:  Education details: anatomy, exercise progression, DOMS expectations, muscle firing,  HEP, POC Person educated: Patient Education method: Explanation, Demonstration, Tactile cues, Verbal cues, and Handouts Education comprehension: verbalized understanding, returned demonstration, verbal cues required, and tactile cues required  HOME EXERCISE PROGRAM: Land: [email or web address]  Access Code: [email or web address] URL: [email or web address] Date: 10/03/23 Prepared by: Geni Bers This aquatic home exercise program from Med[employer] utilizes pictures from land based exercises, but has been adapted prior to lamination and issuance.    ASSESSMENT:  CLINICAL IMPRESSION: Review of gym exercises and machines that patient would benefit from going forward. Patient tolerates this well and education/set up instructions provided throughout.  Pt would benefit from continued skilled therapy in order to reach goals and maximize functional LE and trunk strength for prevention of further functional  decline.   Initial Impression Patient is a 68 y.o. female who was seen today for physical therapy evaluation and treatment for c/c of LBP and knee pain. Pt's s/s appear consistent with mechanical LBP from history of fracture and surgery as well as significant knee OA. Pt's pain is moderately sensitive and irritable with movement. Pt's is more strength limited at this time. Plan to continue with developing aquatic HEP and land based strengthening at future sessions for knees and "core."  Pt would benefit from continued skilled therapy in order to reach goals and maximize functional LE and trunk strength for prevention of further functional decline.   OBJECTIVE IMPAIRMENTS: Abnormal gait, decreased activity tolerance, decreased  balance, decreased endurance, decreased knowledge of use of DME, decreased mobility, difficulty walking, decreased ROM, decreased strength, hypomobility, impaired flexibility, improper body mechanics, postural dysfunction, and pain.    ACTIVITY LIMITATIONS: carrying, lifting, bending, standing, squatting, stairs, transfers, bed mobility, and locomotion level   PARTICIPATION LIMITATIONS: meal prep, cleaning, laundry, driving, shopping, and community activity and exercise   PERSONAL FACTORS: Age, Fitness, Time since onset of injury/illness/exacerbation, and 2+ co morbidities:    are also affecting patient's functional outcome.    REHAB POTENTIAL: Fair     CLINICAL DECISION MAKING: Evolving/moderate complexity   EVALUATION COMPLEXITY: Moderate     GOALS:     SHORT TERM GOALS: Target date: 10/25/2023      Pt will become independent with HEP in order to demonstrate synthesis of PT education.    Goal status: ongoing   2.  Pt will report at least 2 pt reduction on NPRS scale for pain in order to demonstrate functional improvement with household activity, self care, and ADL.    Goal status: MET   3.  Pt will be able to demonstrate/report ability to sit/stand/sleep for extended periods of time without pain in order to demonstrate functional improvement and tolerance to static positioning.    Goal status: MET       LONG TERM GOALS: Target date: 12/06/2023      Pt  will become independent with final HEP in order to demonstrate synthesis of PT education.    Goal status: ongoing   Pt will be able to demonstrate at least a 10lb increase in HHD testing for the LE in order to demonstrate improvement in LE strength for daily mobility.    Goal status: partially met   3.  Pt will be able to demonstrate/report ability to sit/stand/sleep for extended periods of time without pain in order to demonstrate functional improvement and tolerance to static positioning.     Goal status: MET    4.  Pt will score >/= 60 and 61 on FOTO to demonstrate improvement in perceived lumbar  and knee function.    Goal status: ongoing   5. Pt will be able to lift/squat/hold >25 lbs in order to demonstrate functional improvement in lumbopelvic strength for return ADL and exercise.  Goal status: ongoing     PLAN:   PT FREQUENCY: 1-2x/week   PT DURATION: 12 weeks   PLANNED INTERVENTIONS: Therapeutic exercises, Therapeutic activity, Neuromuscular re-education, Balance training, Gait training, Patient/Family education, Self Care, Joint mobilization, Joint manipulation, Stair training, Prosthetic training, DME instructions, Aquatic Therapy, Dry Needling, Electrical stimulation, Spinal manipulation, Spinal mobilization, Moist heat, scar mobilization, Splintting, Taping, Vasopneumatic device, Traction, Ultrasound, Ionotophoresis 4mg /ml Dexamethasone, Manual therapy, and Re-evaluation   PLAN FOR NEXT SESSION:  Land based strengthening for lumbopelvic  and knee strength   Reola Mosher Case Vassell,  PT 12/05/2023, 2:32 PM

## 2023-12-07 ENCOUNTER — Ambulatory Visit (HOSPITAL_BASED_OUTPATIENT_CLINIC_OR_DEPARTMENT_OTHER): Payer: Medicare Other | Admitting: Physical Therapy

## 2023-12-07 ENCOUNTER — Encounter (HOSPITAL_BASED_OUTPATIENT_CLINIC_OR_DEPARTMENT_OTHER): Payer: Self-pay | Admitting: Physical Therapy

## 2023-12-07 DIAGNOSIS — M545 Low back pain, unspecified: Secondary | ICD-10-CM

## 2023-12-07 DIAGNOSIS — M25562 Pain in left knee: Secondary | ICD-10-CM | POA: Diagnosis not present

## 2023-12-07 DIAGNOSIS — M25561 Pain in right knee: Secondary | ICD-10-CM

## 2023-12-07 DIAGNOSIS — G8929 Other chronic pain: Secondary | ICD-10-CM | POA: Diagnosis not present

## 2023-12-07 NOTE — Therapy (Signed)
OUTPATIENT PHYSICAL THERAPY THORACOLUMBAR TREATMENT      Patient Name: Courtney Waters MRN: [id number] DOB:[birth year], 68 y.o., female Today's Date: 12/07/2023 PHYSICAL THERAPY DISCHARGE SUMMARY  Visits from Start of Care: 17  Current functional level related to goals / functional outcomes: See below   Remaining deficits: See below   Education / Equipment: See below   Patient agrees to discharge. Patient goals were met. Patient is being discharged due to meeting the stated rehab goals.   Progress Note   Reporting Period 09/22/24 to 12/07/23   See note below for Objective Data and Assessment of Progress/Goals   END OF SESSION:  PT End of Session - 12/07/23 1514     Visit Number 17    Number of Visits 18    Date for PT Re-Evaluation 12/12/23    Authorization Type MCR    PT Start Time 1513    PT Stop Time 1600    PT Time Calculation (min) 47 min    Activity Tolerance Patient tolerated treatment well    Behavior During Therapy [facility] for tasks assessed/performed                    Past Medical History:  Diagnosis Date   Acid reflux    Arthritis    hands and neck   Back pain 12/01/1998   compression fracture L1 from sledding accident   Bladder infection    Depression    Fibromyalgia    Hearing loss in right ear    Heart murmur    Hiatal hernia 11/2017   MVP (mitral valve prolapse)    Hx   SVD (spontaneous vaginal delivery)    x 2   Past Surgical History:  Procedure Laterality Date   BASAL CELL CARCINOMA EXCISION  12/2021   nose   BUNIONECTOMY Left 10/16/2014   Dr. 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   EYE SURGERY Bilateral 2022   cataract surgery L eye 09/23/21 R eye 10/07/21   INSERTION OF MESH N/A 12/28/2021   Procedure: INSERTION OF MESH;  Surgeon: Axel Filler, MD;  Location: [facility] OR;  Service: General;  Laterality: N/A;    LAPAROSCOPIC BILATERAL SALPINGO OOPHERECTOMY Bilateral 05/13/2014   Procedure: LAPAROSCOPIC BILATERAL SALPINGO OOPHORECTOMY;  Surgeon: Annamaria Boots, MD;  Location: [facility] ORS;  Service: Gynecology;  Laterality: Bilateral;   toe nail removal Left 03/2018   TONSILLECTOMY  age 30   WISDOM TOOTH EXTRACTION  age 83   XI ROBOTIC ASSISTED HIATAL HERNIA REPAIR N/A 12/28/2021   Procedure: XI ROBOTIC ASSISTED HIATAL HERNIA REPAIR WITH MESH AND FUNDOPLICATION;  Surgeon: Axel Filler, MD;  Location: [facility] OR;  Service: General;  Laterality: N/A;   Patient Active Problem List   Diagnosis Date Noted   Basal cell carcinoma (BCC) of dorsum of nose 02/04/2022   S/P Nissen fundoplication (without gastrostomy tube) procedure 12/28/2021   Gastroesophageal reflux disease with hiatal hernia 11/12/2021   S/P BSO (bilateral salpingo-oophorectomy) 08/12/2021   Hiatal hernia 08/12/2021   Primary osteoarthritis of both knees 07/25/2017   Screening for osteoporosis 07/25/2017   DDD (degenerative disc disease), lumbar 07/25/2017   Primary osteoarthritis of both hands 01/26/2017   Primary osteoarthritis of both feet 01/26/2017   Other idiopathic scoliosis, lumbar region 01/26/2017   Fibromyalgia 02/05/2015   Postmenopausal 08/07/2014   Enthesopathy of ankle and tarsus 08/14/2007  BUNIONS, BILATERAL 08/14/2007   UNEQUAL LEG LENGTH, ACQUIRED 08/14/2007   HEEL PAIN, LEFT 08/08/2007   Osteopenia 08/08/2007    PCP: Garlan Fillers, MD   REFERRING PROVIDER: Pollyann Savoy, MD   REFERRING DIAG:  Diagnosis  M22.41,M22.42 (ICD-10-CM) - Chondromalacia of both patellae  M17.0 (ICD-10-CM) - Primary osteoarthritis of both knees  M51.369 (ICD-10-CM) - DDD (degenerative disc disease), lumbar    Rationale for Evaluation and Treatment: Rehabilitation  THERAPY DIAG:  Pain, lumbar region  Pain in joint of right knee  Chronic pain of left knee  ONSET DATE: 2+ years   SUBJECTIVE:                                                                                                                                                                                            SUBJECTIVE STATEMENT:  Pt states HEP going well. Patient states she has had improvement in endurance but some days is limited by fibromyalgia issues. Patient states 70% improvement/functional status. More aware of posture, balance is better, knee pain is better. Remaining deficit is needing to continue with what she has learned. Feels comfortable with machines in the gym.   Initial Subjective Pt has chronic pain from the neck down daily. Pt has history of fibro as well as history of compression fractures. Pt's knee pain did not respond with any major changes to hyaluronic acid injections into the knee. Pt has had improvement in pain with "Pain with [name]" at the [facility] on Monday, Wed, and Fri. Pt does have upper neck and shoulder pain on the R. Pt notes that the knee has previously "given" out before with standing but has not happened recently. Pt has pain in multiple joint sites, especially into L hand/thumb. Pt does find that stress does effect her pain. Denies red flags.   Pt has had not had balance issues or falls.   PERTINENT HISTORY:  Previous L1 compressions fracture 2001  PAIN:  Are you having pain? Yes: NPRS scale: 0/10 at rest today Pain location: knees Pain description: varying- can be dull, ache, sharp Aggravating factors: slightly bent/flexed position at back Relieving factors: aquatic exercise, voltaren, heat, massage gun   PRECAUTIONS: None  RED FLAGS: None   WEIGHT BEARING RESTRICTIONS: No  FALLS:  Has patient fallen in last 6 months? 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  DIAGNOSTIC FINDINGS:  L knee xray  Impression: These findings are consistent with moderate chondromalacia  patella .   R knee xray Impression: These findings are consistent with mild osteoarthritis and  severe chondromalacia patella.   PATIENT SURVEYS:  FOTO 53 61 @ DC for knee  FOTO 53 60 @ DC for lumbar  1/7  49% knee  56% lumbar  12/07/23 63% knee  57 % lumbar   LUMBAR ROM:   AROM eval 1/7  Flexion 90% 90%  Extension 50% 75%  Right lateral flexion 50% 75%  Left lateral flexion 50% 50%  Right rotation 75% 75%  Left rotation 75% 75%   (Blank rows = not tested)  LOWER EXTREMITY ROM:     Active  Right eval Left eval Left 12/5 L 1/7 R 1/7  Hip flexion       Hip extension       Hip abduction       Hip adduction       Hip internal rotation       Hip external rotation 50 40     Knee flexion 125 125  125 125  Knee extension 0 -5 0 0 -2  Ankle dorsiflexion       Ankle plantarflexion       Ankle inversion       Ankle eversion        (Blank rows = not tested)  LOWER EXTREMITY MMT:    MMT Right eval Left eval Rt/Lt 12/5 R/L 1/7 R/L 12/07/23  Hip flexion 43.3 45.9     Hip extension       Hip abduction 38.1 33.3 39.0 39 54.8/48.9  Hip adduction       Hip internal rotation       Hip external rotation       Knee flexion       Knee extension 28.6 33.1 39.3/38.2 41.4/ 51.1 61.4/77.1  Ankle dorsiflexion       Ankle plantarflexion       Ankle inversion       Ankle eversion        (Blank rows = not tested)   GAIT: Distance walked: 85ft Assistive device utilized: None Level of assistance: Complete Independence Comments: mild flexed knee gait, no signficant deviations  TODAY'S TREATMENT:   12/07/23 Bike level 6, for dynamic warm up  Reassessment Review of HEP Able to squat and lift 25# KB 2x   12/05/23 Shuttle leg press  DL leg [id number]  ea S/L shuttle press 31 lbs 3x10 Seated cable row 25# 2 x 10  Cable lat pull down 30# 2 x 10 Leg extension machine 20# 2 x 10  Cable chop 10# 2x 10  Hamstring curl machine 25# 2 x 10   1/22  Nustep Lvl 6, 6 min   Airex tandem balance 30s 3x each Airex march 2x20  Airex tandem with 5lb weight pass 3x10 each  GTB shoulder extension 3x10 GTB paloff 3x10  11/28/23 Nustep Lvl 6, 6 min  Shuttle leg press  DL leg [id number] 1Y78 ea S/L shuttle press 31 lbs 3x10 Cybex leg press 70# 1 x 10, 75 1 x 10 Cable lat pull down 30# 2 x 10 Cable chop 10# 1 x 10   11/22/23 Nustep Lvl 5 6 min Shuttle leg press  DL leg [id number] 3Y86 ea S/L shuttle press 31 lbs 2x10 Seated cable row 25# 2 x 10  Cable lat pull down 30#  2 x 10 Leg extension machine 15# 2 x 10   1/13  Nustep Lvl 5 6 min  Shuttle leg press  DL leg [id number] 0J81 ea S/L shuttle press 25lbs 2x10 SLS on airex 30s 3x; with toe tap x20 Chest supported row 3x8-10 20lbs  Gym HEP, machine set up, machine vs cable vs free weight usage  1/9  Nustep Lvl 5 6 min  Shuttle leg press  single leg 50lbs 2x10 ea Knee extension machine DL 1B14 [id number] Seated T/S ext with towel roll 15x 5lbs farmer carry 83ft 3x laps  DL HR 6O13 Wall T/S ext with arms opening 2x10   1/7  Exam findings, surgical vs conservative management of knee OA  Knee extension machine DL 0Q65 78ION Calf machine DL 6E95 Hamstring curl 2W41 DL 25 lbs  Machine set up, weight selection, graded exposure  11/10/23 Nustep Lvl 5 6 min Shuttle leg press  without band today 75lbs 3x10 Lateral step down 4 inch 2 x 10 bilateral  Tandem airex pad 30s 3x  5lbs farmer carry 54ft 2x laps  Lift/chops BTB 1 x 5 bilateral each SLS 2 x 30 second holds bilateral  Treatment                            12/26:  Nustep Lvl 5 6 min  Shuttle leg press  without band today 75lbs 3x10 Toe walk 3x laps in hallway 5lbs farmer carry 52ft 3x laps (focus on upright posture and "stacking) Use  of paper tape or k-tape discussed for external cuing of posture as pt asks about shoulder bracing for posture 2" box step down 2x10 Tandem airex pad 30s 3x  YTB sidestepping at knees 76ft 2x laps   Treatment                            12/19:  Nustep Lvl 5 6 min  Shuttle leg press YTB at knees 75lbs 3x10 Shuttle leg press SL 25lbs 3x10  Tandem airex pad 30s 3x  YTB sidestepping at knees 21ft 2x laps   5lb LAQ 2x10 each  Treatment                            12/17:  Nustep Lvl 5 5 min  Shuttle leg press YTB at knees 75lbs 3x10 RTB row 2x10 RTB paloff 2x10 S/L clamshell 3s holds 2x10 each Bridge 2x10 YTB at knees Supine piriformis stretch 30s 3x                                                                                                            Treatment                            12/5:  Seated piriformis stretch Hooklying core engagement, + abd press into green tband Hooklying UE press into swiss ball Seated on swiss  ball ab set, + bouncing Standing lat press into swiss ball Standing ragdoll over swiss ball, head turned Lt and breathing into Rt ribcage     PATIENT EDUCATION:  Education details: anatomy, exercise progression, DOMS expectations, muscle firing,  HEP, POC Person educated: Patient Education method: Explanation, Demonstration, Tactile cues, Verbal cues, and Handouts Education comprehension: verbalized understanding, returned demonstration, verbal cues required, and tactile cues required  HOME EXERCISE PROGRAM: Land: [email or web address]  Access Code: [email or web address] URL: [email or web address] Date: 10/03/23 Prepared by: Geni Bers This aquatic home exercise program from [employer] utilizes pictures from land based exercises, but has been adapted prior to lamination and issuance.    ASSESSMENT:  CLINICAL IMPRESSION: Patient has met 3/3 short term goals and 4/5 long term goals with ability to complete HEP and improvement in symptoms, strength, ROM,  activity tolerance, gait, balance, and functional mobility. Remaining goals not met due to continued deficits in activity tolerance, functional mobility. Patient has made good progress toward remaining goals. Reviewed HEP with patient and updated with previously completed exercises. Patient instructed on returning to PT if needed. Patient discharged from PT at this time.     Initial Impression Patient is a 68 y.o. female who was seen today for physical therapy evaluation and treatment for c/c of LBP and knee pain. Pt's s/s appear consistent with mechanical LBP from history of fracture and surgery as well as significant knee OA. Pt's pain is moderately sensitive and irritable with movement. Pt's is more strength limited at this time. Plan to continue with developing aquatic HEP and land based strengthening at future sessions for knees and "core."  Pt would benefit from continued skilled therapy in order to reach goals and maximize functional LE and trunk strength for prevention of further functional decline.   OBJECTIVE IMPAIRMENTS: Abnormal gait, decreased activity tolerance, decreased balance, decreased endurance, decreased knowledge of use of DME, decreased mobility, difficulty walking, decreased ROM, decreased strength, hypomobility, impaired flexibility, improper body mechanics, postural dysfunction, and pain.    ACTIVITY LIMITATIONS: carrying, lifting, bending, standing, squatting, stairs, transfers, bed mobility, and locomotion level   PARTICIPATION LIMITATIONS: meal prep, cleaning, laundry, driving, shopping, and community activity and exercise   PERSONAL FACTORS: Age, Fitness, Time since onset of injury/illness/exacerbation, and 2+ co morbidities:    are also affecting patient's functional outcome.    REHAB POTENTIAL: Fair     CLINICAL DECISION MAKING: Evolving/moderate complexity   EVALUATION COMPLEXITY: Moderate     GOALS:     SHORT TERM GOALS: Target date: 10/25/2023      Pt  will become independent with HEP in order to demonstrate synthesis of PT education.    Goal status: ongoing   2.  Pt will report at least 2 pt reduction on NPRS scale for pain in order to demonstrate functional improvement with household activity, self care, and ADL.    Goal status: MET   3.  Pt will be able to demonstrate/report ability to sit/stand/sleep for extended periods of time without pain in order to demonstrate functional improvement and tolerance to static positioning.    Goal status: MET       LONG TERM GOALS: Target date: 12/06/2023      Pt  will become independent with final HEP in order to demonstrate synthesis of PT education.    Goal status: MET   Pt will be able to demonstrate at least a 10lb increase in HHD testing for the LE in order to demonstrate improvement in LE strength for daily mobility.  Goal status: MET   3.  Pt will be able to demonstrate/report ability to sit/stand/sleep for extended periods of time without pain in order to demonstrate functional improvement and tolerance to static positioning.     Goal status: MET   4.  Pt will score >/= 60 and 61 on FOTO to demonstrate improvement in perceived lumbar  and knee function.    Goal status: partially met   5. Pt will be able to lift/squat/hold >25 lbs in order to demonstrate functional improvement in lumbopelvic strength for return ADL and exercise.  Goal status: ongoing     PLAN:   PT FREQUENCY: 1-2x/week   PT DURATION: 12 weeks   PLANNED INTERVENTIONS: Therapeutic exercises, Therapeutic activity, Neuromuscular re-education, Balance training, Gait training, Patient/Family education, Self Care, Joint mobilization, Joint manipulation, Stair training, Prosthetic training, DME instructions, Aquatic Therapy, Dry Needling, Electrical stimulation, Spinal manipulation, Spinal mobilization, Moist heat, scar mobilization, Splintting, Taping, Vasopneumatic device, Traction, Ultrasound, Ionotophoresis  4mg /ml Dexamethasone, Manual therapy, and Re-evaluation   PLAN FOR NEXT SESSION:  N/a   Reola Mosher Dynasty Holquin, PT 12/07/2023, 4:00 PM

## 2024-01-04 DIAGNOSIS — H524 Presbyopia: Secondary | ICD-10-CM | POA: Diagnosis not present

## 2024-01-04 DIAGNOSIS — H52203 Unspecified astigmatism, bilateral: Secondary | ICD-10-CM | POA: Diagnosis not present

## 2024-01-04 DIAGNOSIS — H18513 Endothelial corneal dystrophy, bilateral: Secondary | ICD-10-CM | POA: Diagnosis not present

## 2024-01-04 DIAGNOSIS — Z961 Presence of intraocular lens: Secondary | ICD-10-CM | POA: Diagnosis not present

## 2024-01-07 ENCOUNTER — Other Ambulatory Visit: Payer: Self-pay | Admitting: Rheumatology

## 2024-01-08 NOTE — Telephone Encounter (Signed)
 Last Fill: 10/12/2023  Next Visit: 02/06/2024  Last Visit: 08/08/2023  Dx: Fibromyalgia   Current Dose per office note on 08/08/2023: Cymbalta 30 mg 1 capsule by mouth daily.   Okay to refill Cymbalta?

## 2024-01-23 NOTE — Progress Notes (Unsigned)
 Office Visit Note  Patient: Courtney Waters             Date of Birth: July 12, 1956           MRN: [id number]             PCP: Garlan Fillers, MD Referring: Garlan Fillers, MD Visit Date: 02/06/2024 Occupation: @GUAROCC @  Subjective:  Intermittent arthralgias   History of Present Illness: Courtney Waters is a 68 y.o. female with history of fibromyalgia and osteoarthritis.  Patient remains on Cymbalta 30 mg 1 capsule by mouth daily.  Patient states that in December she was initiated on sulindac 150 mg 1 tablet twice daily for pain relief.  She has been tolerating sulindac without side effects and is taking it with food.  She has noticed a significant improvement in her symptoms since starting sulindac.  Patient states that her knee pain and stiffness has been manageable.  Patient states that she went to physical therapy at [facility] well which she found to be helpful.  She continues to go to water aerobics 3 days a week.  Patient has upcoming routine visit scheduled with her PCP on Thursday.   Activities of Daily Living:  Patient reports morning stiffness for 1-1.5 hours.   Patient Denies nocturnal pain.  Difficulty dressing/grooming: Denies Difficulty climbing stairs: Denies Difficulty getting out of chair: Denies Difficulty using hands for taps, buttons, cutlery, and/or writing: Denies  Review of Systems  Constitutional:  Negative for fatigue.  HENT:  Negative for mouth sores and mouth dryness.   Eyes:  Positive for dryness.  Respiratory:  Negative for shortness of breath.   Cardiovascular:  Negative for chest pain and palpitations.  Gastrointestinal:  Negative for blood in stool, constipation and diarrhea.  Endocrine: Negative for increased urination.  Genitourinary:  Negative for involuntary urination.  Musculoskeletal:  Positive for joint pain, joint pain, morning stiffness and muscle tenderness. Negative for gait problem, joint swelling, myalgias, muscle weakness and  myalgias.  Skin:  Negative for color change, rash, hair loss and sensitivity to sunlight.  Allergic/Immunologic: Negative for susceptible to infections.  Neurological:  Negative for dizziness and headaches.  Hematological:  Negative for swollen glands.  Psychiatric/Behavioral:  Negative for depressed mood and sleep disturbance. The patient is not nervous/anxious.     PMFS History:  Patient Active Problem List   Diagnosis Date Noted   Basal cell carcinoma (BCC) of dorsum of nose 02/04/2022   S/P Nissen fundoplication (without gastrostomy tube) procedure 12/28/2021   Gastroesophageal reflux disease with hiatal hernia 11/12/2021   S/P BSO (bilateral salpingo-oophorectomy) 08/12/2021   Hiatal hernia 08/12/2021   Primary osteoarthritis of both knees 07/25/2017   Screening for osteoporosis 07/25/2017   DDD (degenerative disc disease), lumbar 07/25/2017   Primary osteoarthritis of both hands 01/26/2017   Primary osteoarthritis of both feet 01/26/2017   Other idiopathic scoliosis, lumbar region 01/26/2017   Fibromyalgia 02/05/2015   Postmenopausal 08/07/2014   Enthesopathy of ankle and tarsus 08/14/2007   BUNIONS, BILATERAL 08/14/2007   UNEQUAL LEG LENGTH, ACQUIRED 08/14/2007   HEEL PAIN, LEFT 08/08/2007   Osteopenia 08/08/2007    Past Medical History:  Diagnosis Date   Acid reflux    Arthritis    hands and neck   Back pain 12/01/1998   compression fracture L1 from sledding accident   Bladder infection    Corneal dystrophy    Depression    Fibromyalgia    Hearing loss in right ear    Heart  murmur    Hiatal hernia 11/2017   MVP (mitral valve prolapse)    Hx   SVD (spontaneous vaginal delivery)    x 2    Family History  Problem Relation Age of Onset   Diabetes Mother    Hearing loss Mother    Cancer Father        GI   Heart disease Sister    Hearing loss Brother    Breast cancer Maternal Aunt    Ovarian cancer Other        MGM, mid 9's   Past Surgical History:   Procedure Laterality Date   BASAL CELL CARCINOMA EXCISION  12/2021   nose   BUNIONECTOMY Left 10/16/2014   Dr. 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   EYE SURGERY Bilateral 2022   cataract surgery L eye 09/23/21 R eye 10/07/21   INSERTION OF MESH N/A 12/28/2021   Procedure: INSERTION OF MESH;  Surgeon: Axel Filler, MD;  Location: [facility] OR;  Service: General;  Laterality: N/A;   LAPAROSCOPIC BILATERAL SALPINGO OOPHERECTOMY Bilateral 05/13/2014   Procedure: LAPAROSCOPIC BILATERAL SALPINGO OOPHORECTOMY;  Surgeon: Annamaria Boots, MD;  Location: [facility] ORS;  Service: Gynecology;  Laterality: Bilateral;   toe nail removal Left 03/2018   TONSILLECTOMY  age 25   WISDOM TOOTH EXTRACTION  age 79   XI ROBOTIC ASSISTED HIATAL HERNIA REPAIR N/A 12/28/2021   Procedure: XI ROBOTIC ASSISTED HIATAL HERNIA REPAIR WITH MESH AND FUNDOPLICATION;  Surgeon: Axel Filler, MD;  Location: [facility] OR;  Service: General;  Laterality: N/A;   Social History   Social History Narrative   Not on file   Immunization History  Administered Date(s) Administered   Fluad Quad(high Dose 65+) 08/12/2021, 08/15/2022   Influenza,inj,Quad PF,6+ Mos 07/23/2019   PFIZER(Purple Top)SARS-COV-2 Vaccination 12/19/2019, 01/13/2020, 09/20/2020   Tdap 01/31/2017     Objective: Vital Signs: BP 131/85 (BP Location: Left Arm, Patient Position: Sitting, Cuff Size: Normal)   Pulse 78   Resp 14   Ht 5' 7.5" (1.715 m)   Wt 155 lb (70.3 kg)   LMP 11/07/2009   BMI 23.92 kg/m    Physical Exam Vitals and nursing note reviewed.  Constitutional:      Appearance: She is well-developed.  HENT:     Head: Normocephalic and atraumatic.  Eyes:     Conjunctiva/sclera: Conjunctivae normal.  Cardiovascular:     Rate and Rhythm: Normal rate and regular rhythm.     Heart sounds: Normal heart sounds.  Pulmonary:     Effort:  Pulmonary effort is normal.     Breath sounds: Normal breath sounds.  Abdominal:     General: Bowel sounds are normal.     Palpations: Abdomen is soft.  Musculoskeletal:     Cervical back: Normal range of motion.  Lymphadenopathy:     Cervical: No cervical adenopathy.  Skin:    General: Skin is warm and dry.     Capillary Refill: Capillary refill takes less than 2 seconds.  Neurological:     Mental Status: She is alert and oriented to person, place, and time.  Psychiatric:        Behavior: Behavior normal.      Musculoskeletal Exam: C-spine, thoracic spine, and lumbar spine good ROM.  Shoulder joints, elbow joints, wrist joints, MCPs, PIPs, and DIPs good ROM with no synovitis.  CMC joint  thickening and tenderness bilaterally.  PIP and DIP thickening consistent with osteoarthritis of both hands.  Hip joints have good ROM with no groin pain.  Knee joints have good ROM with no warmth or effusion.  Ankle joints have good ROM with no tenderness or joint swelling.   CDAI Exam: CDAI Score: -- Patient Global: --; Provider Global: -- Swollen: --; Tender: -- Joint Exam 02/06/2024   No joint exam has been documented for this visit   There is currently no information documented on the homunculus. Go to the Rheumatology activity and complete the homunculus joint exam.  Investigation: No additional findings.  Imaging: No results found.  Recent Labs: Lab Results  Component Value Date   WBC 6.4 12/22/2021   HGB 11.5 (L) 12/22/2021   PLT 347 12/22/2021   NA 137 12/29/2021   K 4.0 12/29/2021   CL 106 12/29/2021   CO2 22 12/29/2021   GLUCOSE 134 (H) 12/29/2021   BUN 8 12/29/2021   CREATININE 0.93 12/29/2021   BILITOT 0.4 05/22/2019   ALKPHOS 49 05/22/2019   AST 19 05/22/2019   ALT 11 05/22/2019   PROT 6.1 05/22/2019   ALBUMIN 4.1 05/22/2019   CALCIUM 8.7 (L) 12/29/2021   GFRAA 74 05/22/2019    Speciality Comments: No specialty comments available.  Procedures:  No  procedures performed Allergies: Nsaids   Assessment / Plan:     Visit Diagnoses: Fibromyalgia: She has intermittent myalgias and muscle tenderness due to fibromyalgia.  She remains on Cymbalta 30 mg 1 capsule by mouth daily.  She has been going to water aerobics 3 days a week and completed physical therapy at [facility] [place].  Her lower back pain and discomfort in both knees have been more manageable since completing physical therapy.  She has also been taking sulindac 150 mg BID for pain relief since December 2024 which has been helpful at managing her myalgias and arthralgias. She will follow-up in the office in 6 months or sooner if needed.  Greater trochanteric bursitis of right hip: Patient has tenderness over the right trochanteric bursa on examination today.  Overall her symptoms have been manageable.  Other fatigue - Chronic, stable. Patient requested to have thyroid panel updated--she has an echo appointment on Thursday with her PCP at which time she plans on having updated lab work.  Plan: Thyroid Panel With TSH  Primary osteoarthritis of both hands: She has severe CMC joint prominence and thickening bilaterally.  Tenderness over both CMC joints, left greater than right noted today.  PIP and DIP thickening consistent with osteoarthritic changes.  No synovitis on exam.  Discussed the importance of joint protection and muscle strengthening.  Chondromalacia of both patellae: She will notify us when and if she would like to reapply for viscosupplementation for both knees.  Primary osteoarthritis of both knees - s/p orthovisc bilateral knees 10/2022.  Overall the discomfort in her knee joints has been manageable. No warmth or effusion noted.  She has been taking sulindac 150 mg 1 tablet twice daily for pain relief.  She will notify us when and if she would like to reapply for viscosupplementation for both knees.  Primary osteoarthritis of both feet: She is not experiencing any increased discomfort in  her feet at this time.   Degeneration of intervertebral disc of lumbar region without discogenic back pain or lower extremity pain: Intermittent discomfort.  Patient completed physical [facility] at [facility].  Other idiopathic scoliosis, lumbar region: Patient completed physical therapy at [facility].  Osteopenia of multiple sites:  DEXA updated on 08/20/20:The BMD measured at Femur Neck Left is 0.855 g/cm2 with a T-score of -1.3.  Followed by Dr. Hyacinth Meeker.  She is taking vitamin D daily.    Other medical conditions are listed as follows:   History of gastroesophageal reflux (GERD): She is taking omeprazole 20 mg daily.  Facial basal cell cancer  Hiatal hernia    Orders: Orders Placed This Encounter  Procedures   CBC with Differential/Platelet   Comprehensive metabolic panel with GFR   HgB W0J   Lipid panel   Thyroid Panel With TSH   No orders of the defined types were placed in this encounter.    Follow-Up Instructions: Return in about 6 months (around 08/07/2024) for Osteoarthritis, Fibromyalgia.   Gearldine Bienenstock, PA-C  Note - This record has been created using Dragon software.  Chart creation errors have been sought, but may not always  have been located. Such creation errors do not reflect on  the standard of medical care.

## 2024-02-01 DIAGNOSIS — K219 Gastro-esophageal reflux disease without esophagitis: Secondary | ICD-10-CM | POA: Diagnosis not present

## 2024-02-01 DIAGNOSIS — D649 Anemia, unspecified: Secondary | ICD-10-CM | POA: Diagnosis not present

## 2024-02-01 DIAGNOSIS — Z1212 Encounter for screening for malignant neoplasm of rectum: Secondary | ICD-10-CM | POA: Diagnosis not present

## 2024-02-01 DIAGNOSIS — R5382 Chronic fatigue, unspecified: Secondary | ICD-10-CM | POA: Diagnosis not present

## 2024-02-01 DIAGNOSIS — M797 Fibromyalgia: Secondary | ICD-10-CM | POA: Diagnosis not present

## 2024-02-01 LAB — COMPREHENSIVE METABOLIC PANEL WITH GFR: EGFR (Non-African Amer.): 49.1

## 2024-02-06 ENCOUNTER — Ambulatory Visit: Payer: BLUE CROSS/BLUE SHIELD | Attending: Physician Assistant | Admitting: Physician Assistant

## 2024-02-06 ENCOUNTER — Encounter: Payer: Self-pay | Admitting: Physician Assistant

## 2024-02-06 VITALS — BP 131/85 | HR 78 | Resp 14 | Ht 67.5 in | Wt 155.0 lb

## 2024-02-06 DIAGNOSIS — M19071 Primary osteoarthritis, right ankle and foot: Secondary | ICD-10-CM | POA: Diagnosis not present

## 2024-02-06 DIAGNOSIS — M19042 Primary osteoarthritis, left hand: Secondary | ICD-10-CM | POA: Insufficient documentation

## 2024-02-06 DIAGNOSIS — M2242 Chondromalacia patellae, left knee: Secondary | ICD-10-CM | POA: Diagnosis not present

## 2024-02-06 DIAGNOSIS — M19072 Primary osteoarthritis, left ankle and foot: Secondary | ICD-10-CM | POA: Insufficient documentation

## 2024-02-06 DIAGNOSIS — Z8719 Personal history of other diseases of the digestive system: Secondary | ICD-10-CM | POA: Diagnosis not present

## 2024-02-06 DIAGNOSIS — Z13220 Encounter for screening for lipoid disorders: Secondary | ICD-10-CM | POA: Insufficient documentation

## 2024-02-06 DIAGNOSIS — M7061 Trochanteric bursitis, right hip: Secondary | ICD-10-CM | POA: Diagnosis not present

## 2024-02-06 DIAGNOSIS — C44310 Basal cell carcinoma of skin of unspecified parts of face: Secondary | ICD-10-CM | POA: Diagnosis not present

## 2024-02-06 DIAGNOSIS — M8589 Other specified disorders of bone density and structure, multiple sites: Secondary | ICD-10-CM | POA: Diagnosis not present

## 2024-02-06 DIAGNOSIS — Z5181 Encounter for therapeutic drug level monitoring: Secondary | ICD-10-CM | POA: Diagnosis not present

## 2024-02-06 DIAGNOSIS — M4126 Other idiopathic scoliosis, lumbar region: Secondary | ICD-10-CM | POA: Diagnosis not present

## 2024-02-06 DIAGNOSIS — K449 Diaphragmatic hernia without obstruction or gangrene: Secondary | ICD-10-CM | POA: Diagnosis not present

## 2024-02-06 DIAGNOSIS — M170 Bilateral primary osteoarthritis of knee: Secondary | ICD-10-CM | POA: Insufficient documentation

## 2024-02-06 DIAGNOSIS — M19041 Primary osteoarthritis, right hand: Secondary | ICD-10-CM | POA: Insufficient documentation

## 2024-02-06 DIAGNOSIS — M797 Fibromyalgia: Secondary | ICD-10-CM | POA: Diagnosis not present

## 2024-02-06 DIAGNOSIS — M51369 Other intervertebral disc degeneration, lumbar region without mention of lumbar back pain or lower extremity pain: Secondary | ICD-10-CM | POA: Diagnosis not present

## 2024-02-06 DIAGNOSIS — M2241 Chondromalacia patellae, right knee: Secondary | ICD-10-CM | POA: Diagnosis not present

## 2024-02-06 DIAGNOSIS — R5383 Other fatigue: Secondary | ICD-10-CM | POA: Insufficient documentation

## 2024-02-07 ENCOUNTER — Telehealth: Payer: Self-pay | Admitting: *Deleted

## 2024-02-07 NOTE — Telephone Encounter (Signed)
 Labs received from:[facility]  Drawn on: 02/01/2024  Reviewed by: Dr. Pollyann Savoy   Labs drawn: CMP, CBC, Lipid, Iron and TIBC, Ferritin  Results: Total Protein 5.6    BUN 29    RBC 4.1   Hgb 11.5   Hct 35.7   Triglycerides 119  Patient is on Cymbalta 30 mg po daily.

## 2024-02-08 DIAGNOSIS — Z1339 Encounter for screening examination for other mental health and behavioral disorders: Secondary | ICD-10-CM | POA: Diagnosis not present

## 2024-02-08 DIAGNOSIS — M797 Fibromyalgia: Secondary | ICD-10-CM | POA: Diagnosis not present

## 2024-02-08 DIAGNOSIS — K219 Gastro-esophageal reflux disease without esophagitis: Secondary | ICD-10-CM | POA: Diagnosis not present

## 2024-02-08 DIAGNOSIS — R5383 Other fatigue: Secondary | ICD-10-CM | POA: Diagnosis not present

## 2024-02-08 DIAGNOSIS — R1319 Other dysphagia: Secondary | ICD-10-CM | POA: Diagnosis not present

## 2024-02-08 DIAGNOSIS — M545 Low back pain, unspecified: Secondary | ICD-10-CM | POA: Diagnosis not present

## 2024-02-08 DIAGNOSIS — Z1331 Encounter for screening for depression: Secondary | ICD-10-CM | POA: Diagnosis not present

## 2024-02-08 DIAGNOSIS — G8929 Other chronic pain: Secondary | ICD-10-CM | POA: Diagnosis not present

## 2024-02-08 DIAGNOSIS — N1831 Chronic kidney disease, stage 3a: Secondary | ICD-10-CM | POA: Diagnosis not present

## 2024-02-08 DIAGNOSIS — R5382 Chronic fatigue, unspecified: Secondary | ICD-10-CM | POA: Diagnosis not present

## 2024-02-08 DIAGNOSIS — Z0000 Encounter for general adult medical examination without abnormal findings: Secondary | ICD-10-CM | POA: Diagnosis not present

## 2024-02-08 DIAGNOSIS — R82998 Other abnormal findings in urine: Secondary | ICD-10-CM | POA: Diagnosis not present

## 2024-02-09 LAB — TSH: TSH: 1.24 (ref 0.41–5.90)

## 2024-02-09 LAB — T4, FREE: Free T4: 1 ng/dL

## 2024-02-23 ENCOUNTER — Encounter: Payer: Self-pay | Admitting: Internal Medicine

## 2024-02-27 ENCOUNTER — Encounter: Payer: Self-pay | Admitting: Internal Medicine

## 2024-03-13 DIAGNOSIS — Z961 Presence of intraocular lens: Secondary | ICD-10-CM | POA: Diagnosis not present

## 2024-03-13 DIAGNOSIS — H18513 Endothelial corneal dystrophy, bilateral: Secondary | ICD-10-CM | POA: Diagnosis not present

## 2024-04-08 ENCOUNTER — Other Ambulatory Visit: Payer: Self-pay | Admitting: *Deleted

## 2024-04-08 MED ORDER — DULOXETINE HCL 30 MG PO CPEP: 30.0000 mg | ORAL_CAPSULE | Freq: Every day | ORAL | 0 refills | Status: DC

## 2024-04-08 NOTE — Telephone Encounter (Signed)
 Last Fill: 01/08/2024  Next Visit: 08/20/2024  Last Visit: 02/06/2024  Dx: Fibromyalgia   Current Dose per office note on 02/06/2024: Cymbalta  30 mg 1 capsule by mouth daily.   Okay to refill Cymbalta ?

## 2024-04-17 DIAGNOSIS — Z961 Presence of intraocular lens: Secondary | ICD-10-CM | POA: Diagnosis not present

## 2024-04-17 DIAGNOSIS — H18513 Endothelial corneal dystrophy, bilateral: Secondary | ICD-10-CM | POA: Diagnosis not present

## 2024-04-17 DIAGNOSIS — L718 Other rosacea: Secondary | ICD-10-CM | POA: Diagnosis not present

## 2024-06-24 DIAGNOSIS — L299 Pruritus, unspecified: Secondary | ICD-10-CM | POA: Diagnosis not present

## 2024-06-27 ENCOUNTER — Other Ambulatory Visit: Payer: Self-pay | Admitting: Internal Medicine

## 2024-06-27 DIAGNOSIS — Z1231 Encounter for screening mammogram for malignant neoplasm of breast: Secondary | ICD-10-CM

## 2024-07-04 ENCOUNTER — Other Ambulatory Visit: Payer: Self-pay | Admitting: Physician Assistant

## 2024-07-04 NOTE — Telephone Encounter (Signed)
 Last Fill: 04/08/2024  Next Visit: 08/20/2024  Last Visit: 02/06/2024  DX: Fibromyalgia   Current Dose per office note on 02/06/2024: Cymbalta  30 mg 1 capsule by mouth daily.   Okay to refill cymbalta ?

## 2024-07-26 DIAGNOSIS — Z961 Presence of intraocular lens: Secondary | ICD-10-CM | POA: Diagnosis not present

## 2024-07-26 DIAGNOSIS — L718 Other rosacea: Secondary | ICD-10-CM | POA: Diagnosis not present

## 2024-07-26 DIAGNOSIS — Z01818 Encounter for other preprocedural examination: Secondary | ICD-10-CM | POA: Diagnosis not present

## 2024-07-26 DIAGNOSIS — H18513 Endothelial corneal dystrophy, bilateral: Secondary | ICD-10-CM | POA: Diagnosis not present

## 2024-07-30 ENCOUNTER — Ambulatory Visit
Admission: RE | Admit: 2024-07-30 | Discharge: 2024-07-30 | Disposition: A | Discharge status: Home / Self Care | Source: Ambulatory Visit | Attending: Internal Medicine | Admitting: Internal Medicine

## 2024-07-30 DIAGNOSIS — Z1231 Encounter for screening mammogram for malignant neoplasm of breast: Secondary | ICD-10-CM

## 2024-07-31 NOTE — Progress Notes (Signed)
 Ophthalmology Department Clinical Visit Note     CHIEF COMPLAINT Patient presents for Post-op Exam   HISTORY OF PRESENT ILLNESS: Courtney Waters is a 68 y.o. female who presents to the clinic today for a post op evaluation s/p DMEK OS 08/01/2024. Referred by Dr. Patrcia. The patient has a history of Fuchs' Dystrophy OU and pseudophakia OU per Dr. Patrcia.  [name] reports her vision is improving since her last post op visit.   HPI     Post-op Exam   Laterality: S/p DMEK left eye.  Characterized as stable.  Associated symptoms include floaters (Since surgery ).  The patient is on eye meds.      Last edited by Wells Pellant Dollarhite, COA on 08/05/2024 11:22 AM.      HISTORICAL INFORMATION:   CURRENT MEDICATIONS: Current Outpatient Medications (Ophthalmic Drugs)  Medication Sig  . moxifloxacin (VIGAMOX) 0.5 % ophthalmic solution Administer 1 drop into left eye 4 (four) times a day.  . prednisoLONE acetate (PRED FORTE) 1 % ophthalmic suspension Administer 1 drop into left eye 4 (four) times a day.   No current facility-administered medications for this visit. (Ophthalmic Drugs)   Current Outpatient Medications (Other)  Medication Sig  . acetaminophen  (Tylenol  8 Hour) 650 mg ER tablet Take 1,300 mg by mouth every morning.  . cetirizine (ZyrTEC) 10 mg tablet Take 10 mg by mouth daily.  . cholecalciferol, vitD3,/vit K2 (VITAMIN D3-VITAMIN K2 ORAL) Take 1 tablet by mouth daily.  . cimetidine (TAGAMET) 200 mg tab tablet Take 200 mg by mouth 2 (two) times a day.  SABRA desonide (DESOWEN) 0.05 % cream Apply topically 2 (two) times a day.  . diphenhydrAMINE  (BENADRYL ) 25 mg capsule Take 25 mg by mouth nightly.  . DULoxetine  (CYMBALTA ) 30 mg capsule Take 30 mg by mouth daily.  SABRA MAGNESIUM ORAL Take 2 capsules by mouth daily.  SABRA omeprazole (PriLOSEC) 20 mg DR capsule Take 20 mg by mouth every morning before breakfast.  . sulindac (CLINORIL, SULIN) 150 mg tablet Take 150 mg by mouth 2  (two) times a day with meals. Take with food.   No current facility-administered medications for this visit. (Other)    ALLERGIES Allergies  Allergen Reactions  . Adhesive Other (See Comments)  . Nsaids (Non-Steroidal Anti-Inflammatory Drug) Other (See Comments)    Pt states she had a gastric ulcer.       PAST MEDICAL HISTORY Past Medical History:  Diagnosis Date  . Basal cell carcinoma (BCC) of dorsum of nose 02/04/2022  . DDD (degenerative disc disease), lumbar 07/25/2017  . Fibromyalgia 02/05/2015  . Fuchs' corneal dystrophy of both eyes 04/17/2024  . Gastroesophageal reflux disease with hiatal hernia 11/12/2021  . MVP (mitral valve prolapse)    per EMR  . Osteopenia 08/08/2007   Qualifier: Diagnosis of   By: Georgina CMA, [place]    . Other idiopathic scoliosis, lumbar region 01/26/2017  . Primary osteoarthritis of both feet 01/26/2017  . Primary osteoarthritis of both hands 01/26/2017  . Primary osteoarthritis of both knees 07/25/2017  . Pseudophakia of both eyes   . S/P BSO (bilateral salpingo-oophorectomy) 08/12/2021   Past Surgical History:  Procedure Laterality Date  . CATARACT EXTRACTION Bilateral 2022   PCIOL OU Dr. Patrcia [place]  . DESCEMETS STRIPPING AUTOMATED ENDOTHELIAL KERATOPLASTY Left 08/01/2024   DMEK performed by Donnice Sickles, MD at [facility] OR  . EXTERNAL EAR SURGERY  1959   as a child  . MOHS SURGERY     nose  .  NISSEN FUNDOPLICATION  2023  . OOPHORECTOMY  2015  . TONSILLECTOMY      FAMILY HISTORY Family History  Problem Relation Name Age of Onset  . Blindness Maternal Aunt    . Macular degeneration Maternal Aunt    . Fuchs' dystrophy Maternal Aunt    . Glaucoma Neg Hx      SOCIAL HISTORY Social History   Tobacco Use  . Smoking status: Never  . Smokeless tobacco: Never  Substance Use Topics  . Alcohol  use: Not on file         OPHTHALMIC EXAM:   Base Eye Exam     Visual Acuity (Snellen - Linear)       Right Left   Dist [place]  20/25 -1 20/200   Dist ph [place] NI 20/70 -2         Neuro/Psych     Oriented x3: Yes   Mood/Affect: Normal           Slit Lamp and Fundus Exam     Slit Lamp Exam       Right Left   Lids/Lashes  MGD UL/LL   Conjunctiva/Sclera  White and quiet   Cornea  DMEK 100% attached with temporal edema   Anterior Chamber  10% gas bubble   Iris  Round and reactive   Lens  PCIOL   Anterior Vitreous  Normal            IMAGING AND PROCEDURES:    ASSESSMENT/PLAN:  1. Status post DMEK of left eye      2. Fuchs' corneal dystrophy of right eye      3. Ocular rosacea      4. Pseudophakia of both eyes        S/p DMEK OS 08/01/2024 - Doing well, graft 100% attached with temporal edema - 10% bubble remaining - Cont PF/Vig QID - Ointment PRN if scratchy - Activity restrictions discussed - Allergic reaction to surgery tape, start Desowen cream BID to affected area and use over-the-counter patch with elastic band to patch eye  Fuchs' Dystrophy OD - Discussed etiology with patient  - Muro 128 ung qhs/gtts QID - pt reports no improvement and burning  Mild Ocular Rosacea  - Discussed etiology with patient - WC/LH QD  Pseudophakia OU per Dr. Patrcia - Stable   RTC - 08/09/2024 post op Return for As scheduled.   Patient Instructions  DROP INSTRUCTIONS FOR THE LEFT EYE Pred Forte (Prednisolone Acetate 1%) (white or pink top) - drops in surgical eye four times per day until the next visit  Vigamox (Moxifloxacin) (tan top) - drops in surgical eye four times per day until Friday 08/09/2024, then discontinue.  Separate drops by 5 minutes Erythromycin ointment at night or if scratchy Shield at night and glasses during the day TAKE IT EASY Shower with eyes closed No eye makeup, dirty work or swimming this week Call if decreased vision, increased pain, or increased redness or as needed Call [phone] or [phone]    Ophthalmic Meds Ordered this visit:  There  were no meds ordered this visit.    Explained the diagnoses, plan, and follow up with the patient and they expressed understanding.  Patient expressed understanding of the importance of proper follow up care.    Abbreviations: M myopia (nearsighted); A astigmatism; H hyperopia (farsighted); P presbyopia; Mrx spectacle prescription;  CTL contact lenses; OD right eye; OS left eye; OU both eyes  XT exotropia; ET esotropia; PEK punctate epithelial keratitis;  PEE punctate epithelial erosions; DES dry eye syndrome; MGD meibomian gland dysfunction; ATs artificial tears; PFAT's preservative free artificial tears; NSC nuclear sclerotic cataract; PSC posterior subcapsular cataract; ERM epi-retinal membrane; PVD posterior vitreous detachment; RD retinal detachment; DM diabetes mellitus; DR diabetic retinopathy; NPDR non-proliferative diabetic retinopathy; PDR proliferative diabetic retinopathy; CSME clinically significant macular edema; DME diabetic macular edema; dbh dot blot hemorrhages; CWS cotton wool spot; POAG primary open angle glaucoma; C/D cup-to-disc ratio; HVF humphrey visual field; GVF goldmann visual field; OCT optical coherence tomography; IOP intraocular pressure; BRVO Branch retinal vein occlusion; CRVO central retinal vein occlusion; CRAO central retinal artery occlusion; BRAO branch retinal artery occlusion; RT retinal tear; SB scleral buckle; PPV pars plana vitrectomy; VH Vitreous hemorrhage; PRP panretinal laser photocoagulation; IVK intravitreal kenalog ; VMT vitreomacular traction; MH Macular hole;  NVD neovascularization of the disc; NVE neovascularization elsewhere; AREDS age related eye disease study; ARMD age related macular degeneration; POAG primary open angle glaucoma; EBMD epithelial/anterior basement membrane dystrophy; ACIOL anterior chamber intraocular lens; IOL intraocular lens; PCIOL posterior chamber intraocular lens; Phaco/IOL phacoemulsification with intraocular lens placement; PRK  photorefractive keratectomy; LASIK laser assisted in situ keratomileusis; HTN hypertension; DM diabetes mellitus; COPD chronic obstructive pulmonary disease   This document serves as a record of services personally performed by Donnice Sickles, MD. It was created on their behalf by Jinnie Caldron, Scribe, a trained medical scribe. The creation of this record is the provider's dictation and/or activities during the visit.

## 2024-08-01 DIAGNOSIS — H18512 Endothelial corneal dystrophy, left eye: Secondary | ICD-10-CM | POA: Diagnosis not present

## 2024-08-01 DIAGNOSIS — I341 Nonrheumatic mitral (valve) prolapse: Secondary | ICD-10-CM | POA: Diagnosis not present

## 2024-08-02 DIAGNOSIS — Z961 Presence of intraocular lens: Secondary | ICD-10-CM | POA: Diagnosis not present

## 2024-08-02 DIAGNOSIS — H18511 Endothelial corneal dystrophy, right eye: Secondary | ICD-10-CM | POA: Diagnosis not present

## 2024-08-02 DIAGNOSIS — L718 Other rosacea: Secondary | ICD-10-CM | POA: Diagnosis not present

## 2024-08-02 DIAGNOSIS — Z947 Corneal transplant status: Secondary | ICD-10-CM | POA: Diagnosis not present

## 2024-08-05 DIAGNOSIS — Z947 Corneal transplant status: Secondary | ICD-10-CM | POA: Diagnosis not present

## 2024-08-05 DIAGNOSIS — L718 Other rosacea: Secondary | ICD-10-CM | POA: Diagnosis not present

## 2024-08-05 DIAGNOSIS — H18511 Endothelial corneal dystrophy, right eye: Secondary | ICD-10-CM | POA: Diagnosis not present

## 2024-08-05 DIAGNOSIS — Z961 Presence of intraocular lens: Secondary | ICD-10-CM | POA: Diagnosis not present

## 2024-08-07 NOTE — Progress Notes (Signed)
 Office Visit Note  Patient: Courtney Waters             Date of Birth: Aug 21, 1956           MRN: [id number]             PCP: Yolande Toribio MATSU, MD Referring: Yolande Toribio MATSU, MD Visit Date: 08/20/2024 Occupation: Data Unavailable  Subjective:  Pain in multiple joints  History of Present Illness: Courtney Waters is a 68 y.o. female with osteoarthritis and fibromyalgia syndrome.  She returns today after her last visit in April 2025.  She states she had left corneal transplant in September which went well.  She will need right corneal transplant in the future.  She continues to have pain and stiffness in her hands especially the CMC joints.  She also has ongoing pain and discomfort in her knee joints.  She has some generalized aches and pain from fibromyalgia.  She states she has been taking sulindac 150 mg twice daily as needed.  She had been going to water aerobics on a regular basis but she could not go recently due to corneal transplant.    Activities of Daily Living:  Patient reports morning stiffness for 1-2 hours.   Patient Reports nocturnal pain.  Difficulty dressing/grooming: Denies Difficulty climbing stairs: Reports Difficulty getting out of chair: Reports Difficulty using hands for taps, buttons, cutlery, and/or writing: Reports  Review of Systems  Constitutional:  Positive for fatigue.  HENT:  Negative for mouth sores and mouth dryness.   Eyes:  Positive for pain, itching and dryness.  Respiratory:  Positive for shortness of breath. Negative for wheezing.        On exertion   Cardiovascular:  Negative for chest pain and palpitations.  Gastrointestinal:  Positive for constipation. Negative for blood in stool and diarrhea.  Endocrine: Negative for increased urination.  Genitourinary:  Negative for involuntary urination.  Musculoskeletal:  Positive for joint pain, joint pain, myalgias, muscle weakness, morning stiffness, muscle tenderness and myalgias. Negative  for gait problem and joint swelling.  Skin:  Negative for color change, rash, hair loss and sensitivity to sunlight.  Allergic/Immunologic: Negative for susceptible to infections.  Neurological:  Negative for dizziness and headaches.  Hematological:  Negative for swollen glands.  Psychiatric/Behavioral:  Negative for depressed mood and sleep disturbance. The patient is not nervous/anxious.     PMFS History:  Patient Active Problem List   Diagnosis Date Noted   Basal cell carcinoma (BCC) of dorsum of nose 02/04/2022   S/P Nissen fundoplication (without gastrostomy tube) procedure 12/28/2021   Gastroesophageal reflux disease with hiatal hernia 11/12/2021   S/P BSO (bilateral salpingo-oophorectomy) 08/12/2021   Hiatal hernia 08/12/2021   Primary osteoarthritis of both knees 07/25/2017   Screening for osteoporosis 07/25/2017   DDD (degenerative disc disease), lumbar 07/25/2017   Primary osteoarthritis of both hands 01/26/2017   Primary osteoarthritis of both feet 01/26/2017   Other idiopathic scoliosis, lumbar region 01/26/2017   Fibromyalgia 02/05/2015   Postmenopausal 08/07/2014   Enthesopathy of ankle and tarsus 08/14/2007   BUNIONS, BILATERAL 08/14/2007   UNEQUAL LEG LENGTH, ACQUIRED 08/14/2007   HEEL PAIN, LEFT 08/08/2007   Osteopenia 08/08/2007    Past Medical History:  Diagnosis Date   Acid reflux    Arthritis    hands and neck   Back pain 12/01/1998   compression fracture L1 from sledding accident   Bladder infection    Corneal dystrophy    Depression    Fibromyalgia  Hearing loss in right ear    Heart murmur    Hiatal hernia 11/2017   MVP (mitral valve prolapse)    Hx   SVD (spontaneous vaginal delivery)    x 2    Family History  Problem Relation Age of Onset   Diabetes Mother    Hearing loss Mother    Cancer Father        GI   Heart disease Sister    Hearing loss Brother    Breast cancer Maternal Aunt    Ovarian cancer Other        MGM, mid 59's    Past Surgical History:  Procedure Laterality Date   BASAL CELL CARCINOMA EXCISION  12/2021   nose   BUNIONECTOMY Left 10/16/2014   Dr. 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   EYE SURGERY Bilateral 2022   cataract surgery L eye 09/23/21 R eye 10/07/21   INSERTION OF MESH N/A 12/28/2021   Procedure: INSERTION OF MESH;  Surgeon: Rubin Calamity, MD;  Location[facility];  Service: General;  Laterality: N/A;   LAPAROSCOPIC BILATERAL SALPINGO OOPHERECTOMY Bilateral 05/13/2014   Procedure: LAPAROSCOPIC BILATERAL SALPINGO OOPHORECTOMY;  Surgeon: Ronal Elvie Pinal, MD;  Location: [facility] ORS;  Service: Gynecology;  Laterality: Bilateral;   toe nail removal Left 03/2018   TONSILLECTOMY  age 26   WISDOM TOOTH EXTRACTION  age 34   XI ROBOTIC ASSISTED HIATAL HERNIA REPAIR N/A 12/28/2021   Procedure: XI ROBOTIC ASSISTED HIATAL HERNIA REPAIR WITH MESH AND FUNDOPLICATION;  Surgeon: Rubin Calamity, MD;  Location: [facility] OR;  Service: General;  Laterality: N/A;   Social History   Tobacco Use   Smoking status: Never    Passive exposure: Never   Smokeless tobacco: Never  Vaping Use   Vaping status: Never Used  Substance Use Topics   Alcohol  use: Yes    Alcohol /week: 0.0 - 1.0 standard drinks of alcohol     Comment: occasionally   Drug use: No   Social History   Social History Narrative   Not on file     Immunization History  Administered Date(s) Administered   Fluad Quad(high Dose 65+) 08/12/2021, 08/15/2022   Influenza,inj,Quad PF,6+ Mos 07/23/2019   PFIZER(Purple Top)SARS-COV-2 Vaccination 12/19/2019, 01/13/2020, 09/20/2020   Tdap 01/31/2017     Objective: Vital Signs: BP 136/89   Pulse 86   Temp 98.4 F (36.9 C)   Resp 14   Ht 5' 7.5 (1.715 m)   Wt 156 lb (70.8 kg)   LMP 11/07/2009   BMI 24.07 kg/m    Physical Exam Vitals and  nursing note reviewed.  Constitutional:      Appearance: She is well-developed.  HENT:     Head: Normocephalic and atraumatic.  Eyes:     Conjunctiva/sclera: Conjunctivae normal.  Cardiovascular:     Rate and Rhythm: Normal rate and regular rhythm.     Heart sounds: Normal heart sounds.  Pulmonary:     Effort: Pulmonary effort is normal.     Breath sounds: Normal breath sounds.  Abdominal:     General: Bowel sounds are normal.     Palpations: Abdomen is soft.  Musculoskeletal:     Cervical back: Normal range of motion.  Lymphadenopathy:     Cervical: No cervical adenopathy.  Skin:    General: Skin is warm and dry.  Capillary Refill: Capillary refill takes less than 2 seconds.  Neurological:     Mental Status: She is alert and oriented to person, place, and time.  Psychiatric:        Behavior: Behavior normal.      Musculoskeletal Exam: Cervical, thoracic and lumbar spine were in good range of motion.  There was no SI joint tenderness.  Shoulder joints, elbow joints, wrist joints, MCPs, PIPs and DIPs were in good range of motion with no synovitis.  Bilateral CMC subluxation was noted.  Hip joints and knee joints were in good range of motion without any warmth swelling or effusion.  She has crepitus in her bilateral knee joints.  No warmth swelling or effusion was noted.  There was no tenderness over ankles or MTPs.   CDAI Exam: CDAI Score: -- Patient Global: --; Provider Global: -- Swollen: --; Tender: -- Joint Exam 08/20/2024   No joint exam has been documented for this visit   There is currently no information documented on the homunculus. Go to the Rheumatology activity and complete the homunculus joint exam.  Investigation: No additional findings.  Imaging: MM 3D SCREENING MAMMOGRAM BILATERAL BREAST Result Date: 07/31/2024 CLINICAL DATA:  Screening. EXAM: DIGITAL SCREENING BILATERAL MAMMOGRAM WITH TOMOSYNTHESIS AND CAD TECHNIQUE: Bilateral screening digital  craniocaudal and mediolateral oblique mammograms were obtained. Bilateral screening digital breast tomosynthesis was performed. The images were evaluated with computer-aided detection. COMPARISON:  Previous exam(s). ACR Breast Density Category d: The breasts are extremely dense, which lowers the sensitivity of mammography. FINDINGS: There are no findings suspicious for malignancy. IMPRESSION: No mammographic evidence of malignancy. A result letter of this screening mammogram will be mailed directly to the patient. RECOMMENDATION: Screening mammogram in one year. (Code:SM-B-01Y) BI-RADS CATEGORY  1: Negative. Electronically Signed   By: Curtistine Noble   On: 07/31/2024 14:24    Recent Labs: Lab Results  Component Value Date   WBC 6.4 12/22/2021   HGB 11.5 (L) 12/22/2021   PLT 347 12/22/2021   NA 137 12/29/2021   K 4.0 12/29/2021   CL 106 12/29/2021   CO2 22 12/29/2021   GLUCOSE 134 (H) 12/29/2021   BUN 8 12/29/2021   CREATININE 0.93 12/29/2021   BILITOT 0.4 05/22/2019   ALKPHOS 49 05/22/2019   AST 19 05/22/2019   ALT 11 05/22/2019   PROT 6.1 05/22/2019   ALBUMIN 4.1 05/22/2019   CALCIUM 8.7 (L) 12/29/2021   GFRAA 74 05/22/2019    Speciality Comments: No specialty comments available.  Procedures:  No procedures performed Allergies: Nsaids   Assessment / Plan:     Visit Diagnoses: Primary osteoarthritis of both hands-she continues to have pain and stiffness in her hands.  Bilateral CMC subluxation was noted.  Joint protection muscle strengthening was discussed.  A handout on hand exercises given.  Natural anti-inflammatory supplements and their side effects were discussed.  Greater trochanteric bursitis of right hip-she reports intermittent pain.  Primary osteoarthritis of both knees - s/p orthovisc bilateral knees 10/2022.  She has constant discomfort in her knee joints.  No warmth swelling or effusion was noted.  Chondromalacia of both patellae-she had crepitus in her knee  joints.  She feels instability in her knee joints at times.  I will refer her to orthopedics.  A handout on lower extremity exercise was given.  Primary osteoarthritis of both feet-fitting shoes were advised.  Degeneration of intervertebral disc of lumbar region without discogenic back pain or lower extremity pain-chronic discomfort.  Other idiopathic scoliosis, lumbar region  Osteopenia of multiple sites - DEXA updated on 08/20/20:The BMD measured at Femur Neck Left is 0.855 g/cm2 with a T-score of -1.3.  Followed by Dr. Cleotilde.  Fibromyalgia -she continues to have generalized pain and discomfort.  Need for regular exercise and stretching was discussed.  Cymbalta  30 mg 1 capsule by mouth daily.sulindac 150 mg BID as needed.  Water aerobics 3 days a week and completed physical therapy at [facility].  Status post corneal transplant - Left, August 01, 2024.  Patient states she was diagnosed with Fuchs dystrophy.  She will require right corneal transplant in the future.  Other fatigue-related to fibromyalgia.  History of gastroesophageal reflux (GERD)  Hiatal hernia  Facial basal cell cancer  Orders: No orders of the defined types were placed in this encounter.  No orders of the defined types were placed in this encounter.  Follow-Up Instructions: Return in about 6 months (around 02/18/2025) for Osteoarthritis.   Maya Nash, MD  Note - This record has been created using Animal nutritionist.  Chart creation errors have been sought, but may not always  have been located. Such creation errors do not reflect on  the standard of medical care.

## 2024-08-09 DIAGNOSIS — L718 Other rosacea: Secondary | ICD-10-CM | POA: Diagnosis not present

## 2024-08-09 DIAGNOSIS — Z947 Corneal transplant status: Secondary | ICD-10-CM | POA: Diagnosis not present

## 2024-08-09 DIAGNOSIS — H18511 Endothelial corneal dystrophy, right eye: Secondary | ICD-10-CM | POA: Diagnosis not present

## 2024-08-09 DIAGNOSIS — Z961 Presence of intraocular lens: Secondary | ICD-10-CM | POA: Diagnosis not present

## 2024-08-11 DIAGNOSIS — H5789 Other specified disorders of eye and adnexa: Secondary | ICD-10-CM | POA: Diagnosis not present

## 2024-08-11 DIAGNOSIS — R6889 Other general symptoms and signs: Secondary | ICD-10-CM | POA: Diagnosis not present

## 2024-08-20 ENCOUNTER — Ambulatory Visit: Attending: Rheumatology | Admitting: Rheumatology

## 2024-08-20 ENCOUNTER — Encounter: Payer: Self-pay | Admitting: Rheumatology

## 2024-08-20 VITALS — BP 136/89 | HR 86 | Temp 98.4°F | Resp 14 | Ht 67.5 in | Wt 156.0 lb

## 2024-08-20 DIAGNOSIS — M7061 Trochanteric bursitis, right hip: Secondary | ICD-10-CM | POA: Insufficient documentation

## 2024-08-20 DIAGNOSIS — Z947 Corneal transplant status: Secondary | ICD-10-CM | POA: Diagnosis not present

## 2024-08-20 DIAGNOSIS — M51369 Other intervertebral disc degeneration, lumbar region without mention of lumbar back pain or lower extremity pain: Secondary | ICD-10-CM | POA: Diagnosis not present

## 2024-08-20 DIAGNOSIS — C44310 Basal cell carcinoma of skin of unspecified parts of face: Secondary | ICD-10-CM | POA: Insufficient documentation

## 2024-08-20 DIAGNOSIS — K449 Diaphragmatic hernia without obstruction or gangrene: Secondary | ICD-10-CM | POA: Diagnosis present

## 2024-08-20 DIAGNOSIS — M19071 Primary osteoarthritis, right ankle and foot: Secondary | ICD-10-CM | POA: Diagnosis not present

## 2024-08-20 DIAGNOSIS — R5383 Other fatigue: Secondary | ICD-10-CM | POA: Diagnosis not present

## 2024-08-20 DIAGNOSIS — M19072 Primary osteoarthritis, left ankle and foot: Secondary | ICD-10-CM | POA: Insufficient documentation

## 2024-08-20 DIAGNOSIS — M19042 Primary osteoarthritis, left hand: Secondary | ICD-10-CM | POA: Insufficient documentation

## 2024-08-20 DIAGNOSIS — Z8719 Personal history of other diseases of the digestive system: Secondary | ICD-10-CM | POA: Diagnosis not present

## 2024-08-20 DIAGNOSIS — M170 Bilateral primary osteoarthritis of knee: Secondary | ICD-10-CM | POA: Diagnosis not present

## 2024-08-20 DIAGNOSIS — M4126 Other idiopathic scoliosis, lumbar region: Secondary | ICD-10-CM | POA: Insufficient documentation

## 2024-08-20 DIAGNOSIS — M19041 Primary osteoarthritis, right hand: Secondary | ICD-10-CM | POA: Diagnosis not present

## 2024-08-20 DIAGNOSIS — M2241 Chondromalacia patellae, right knee: Secondary | ICD-10-CM | POA: Insufficient documentation

## 2024-08-20 DIAGNOSIS — M8589 Other specified disorders of bone density and structure, multiple sites: Secondary | ICD-10-CM | POA: Diagnosis not present

## 2024-08-20 DIAGNOSIS — M797 Fibromyalgia: Secondary | ICD-10-CM | POA: Insufficient documentation

## 2024-08-20 DIAGNOSIS — M2242 Chondromalacia patellae, left knee: Secondary | ICD-10-CM | POA: Diagnosis present

## 2024-08-20 NOTE — Patient Instructions (Addendum)
 Exercises for Chronic Knee Pain Chronic knee pain is pain that lasts longer than 3 months. For most people with chronic knee pain, exercise and weight loss is an important part of treatment. Your health care provider may want you to focus on: Making the muscles that support your knee stronger. This can take pressure off your knee and reduce pain. Preventing knee stiffness. How far you can move your knee, keeping it there or making it farther. Losing weight (if this applies) to take pressure off your knee, lower your risk for injury, and make it easier for you to exercise. Your provider will help you make an exercise program that fits your needs and physical abilities. Below are simple, low-impact exercises you can do at home. Ask your provider or physical therapist how often you should do your exercise program and how many times to repeat each exercise. General safety tips  Get your provider's approval before doing any exercises. Start slowly and stop any time you feel pain. Do not exercise if your knee pain is flaring up. Warm up first. Stretching a cold muscle can cause an injury. Do 5-10 minutes of easy movement or light stretching before beginning your exercises. Do 5-10 minutes of low-impact activity (like walking or cycling) before starting strengthening exercises. Contact your provider any time you have pain during or after exercising. Exercise can cause discomfort but should not be painful. It is normal to be a little stiff or sore after exercising. Stretching and range-of-motion exercises Front thigh stretch  Stand up straight and support your body by holding on to a chair or resting one hand on a wall. With your legs straight and close together, bend one knee to lift your heel up toward your butt. Using one hand for support, grab your ankle with your free hand. Pull your foot up closer toward your butt to feel the stretch in front of your thigh. Hold the stretch for 30  seconds. Repeat __________ times. Complete this exercise __________ times a day. Back thigh stretch  Sit on the floor with your back straight and your legs out straight in front of you. Place the palms of your hands on the floor and slide them toward your feet as you bend at the hip. Try to touch your nose to your knees and feel the stretch in the back of your thighs. Hold for 30 seconds. Repeat __________ times. Complete this exercise __________ times a day. Calf stretch  Stand facing a wall. Place the palms of your hands flat against the wall, arms extended, and lean slightly against the wall. Get into a lunge position with one leg bent at the knee and the other leg stretched out straight behind you. Keep both feet facing the wall and increase the bend in your knee while keeping the heel of the other leg flat on the ground. You should feel the stretch in your calf. Hold for 30 seconds. Repeat __________ times. Complete this exercise __________ times a day. Strengthening exercises Straight leg lift  Lie on your back with one knee bent and the other leg out straight. Slowly lift the straight leg without bending the knee. Lift until your foot is about 12 inches (30 cm) off the floor. Hold for 3-5 seconds and slowly lower your leg. Repeat __________ times. Complete this exercise __________ times a day. Single leg dip  Stand between two chairs and put both hands on the backs of the chairs for support. Extend one leg out straight with your body  weight resting on the heel of the standing leg. Slowly bend your standing knee to dip your body to the level that is comfortable for you. Hold for 3-5 seconds. Repeat __________ times. Complete this exercise __________ times a day. Hamstring curls  Stand straight, knees close together, facing the back of a chair. Hold on to the back of a chair with both hands. Keep one leg straight. Bend the other knee while bringing the heel up toward the butt  until the knee is bent at a 90-degree angle (right angle). Hold for 3-5 seconds. Repeat __________ times. Complete this exercise __________ times a day. Wall squat  Stand straight with your back, hips, and head against a wall. Step forward one foot at a time with your back still against the wall. Your feet should be 2 feet (61 cm) from the wall at shoulder width. Keeping your back, hips, and head against the wall, slide down the wall to as close to a sitting position as you can get. Hold for 5-10 seconds, then slowly slide back up. Repeat __________ times. Complete this exercise __________ times a day. Step-ups  Stand in front of a sturdy platform or stool that is about 6 inches (15 cm) high. Slowly step up with your left / right foot, keeping your knee in line with your hip and foot. Do not let your knee bend so far that you cannot see your toes. Hold on to a chair for balance, but do not use it for support. Slowly unlock your knee and lower yourself to the starting position. Repeat __________ times. Complete this exercise __________ times a day. Contact a health care provider if: Your exercises cause pain. Your pain is worse after you exercise. Your pain prevents you from doing your exercises. This information is not intended to replace advice given to you by your health care provider. Make sure you discuss any questions you have with your health care provider. Document Revised: 11/08/2022 Document Reviewed: 11/08/2022 Elsevier Patient Education  2024 [employer]  Hand Exercises Hand exercises can be helpful for almost anyone. They can strengthen your hands and improve flexibility and movement. The exercises can also increase blood flow to the hands. These results can make your work and daily tasks easier for you. Hand exercises can be especially helpful for people who have joint pain from arthritis or nerve damage from using their hands over and over. These exercises can also help  people who injure a hand. Exercises Most of these hand exercises are gentle stretching and motion exercises. It is usually safe to do them often throughout the day. Warming up your hands before exercise may help reduce stiffness. You can do this with gentle massage or by placing your hands in warm water for 10-15 minutes. It is normal to feel some stretching, pulling, tightness, or mild discomfort when you begin new exercises. In time, this will improve. Remember to always be careful and stop right away if you feel sudden, very bad pain or your pain gets worse. You want to get better and be safe. Ask your health care provider which exercises are safe for you. Do exercises exactly as told by your provider and adjust them as told. Do not begin these exercises until told by your provider. Knuckle bend or "claw" fist  Stand or sit with your arm, hand, and all five fingers pointed straight up. Make sure to keep your wrist straight. Gently bend your fingers down toward your palm until the tips of your  fingers are touching your palm. Keep your big knuckle straight and only bend the small knuckles in your fingers. Hold this position for 10 seconds. Straighten your fingers back to your starting position. Repeat this exercise 5-10 times with each hand. Full finger fist  Stand or sit with your arm, hand, and all five fingers pointed straight up. Make sure to keep your wrist straight. Gently bend your fingers into your palm until the tips of your fingers are touching the middle of your palm. Hold this position for 10 seconds. Extend your fingers back to your starting position, stretching every joint fully. Repeat this exercise 5-10 times with each hand. Straight fist  Stand or sit with your arm, hand, and all five fingers pointed straight up. Make sure to keep your wrist straight. Gently bend your fingers at the big knuckle, where your fingers meet your hand, and at the middle knuckle. Keep the knuckle at  the tips of your fingers straight and try to touch the bottom of your palm. Hold this position for 10 seconds. Extend your fingers back to your starting position, stretching every joint fully. Repeat this exercise 5-10 times with each hand. Tabletop  Stand or sit with your arm, hand, and all five fingers pointed straight up. Make sure to keep your wrist straight. Gently bend your fingers at the big knuckle, where your fingers meet your hand, as far down as you can. Keep the small knuckles in your fingers straight. Think of forming a tabletop with your fingers. Hold this position for 10 seconds. Extend your fingers back to your starting position, stretching every joint fully. Repeat this exercise 5-10 times with each hand. Finger spread  Place your hand flat on a table with your palm facing down. Make sure your wrist stays straight. Spread your fingers and thumb apart from each other as far as you can until you feel a gentle stretch. Hold this position for 10 seconds. Bring your fingers and thumb tight together again. Hold this position for 10 seconds. Repeat this exercise 5-10 times with each hand. Making circles  Stand or sit with your arm, hand, and all five fingers pointed straight up. Make sure to keep your wrist straight. Make a circle by touching the tip of your thumb to the tip of your index finger. Hold for 10 seconds. Then open your hand wide. Repeat this motion with your thumb and each of your fingers. Repeat this exercise 5-10 times with each hand. Thumb motion  Sit with your forearm resting on a table and your wrist straight. Your thumb should be facing up toward the ceiling. Keep your fingers relaxed as you move your thumb. Lift your thumb up as high as you can toward the ceiling. Hold for 10 seconds. Bend your thumb across your palm as far as you can, reaching the tip of your thumb for the small finger (pinkie) side of your palm. Hold for 10 seconds. Repeat this exercise  5-10 times with each hand. Grip strengthening  Hold a stress ball or other soft ball in the middle of your hand. Slowly increase the pressure, squeezing the ball as much as you can without causing pain. Think of bringing the tips of your fingers into the middle of your palm. All of your finger joints should bend when doing this exercise. Hold your squeeze for 10 seconds, then relax. Repeat this exercise 5-10 times with each hand. Contact a health care provider if: Your hand pain or discomfort gets much worse when  you do an exercise. Your hand pain or discomfort does not improve within 2 hours after you exercise. If you have either of these problems, stop doing these exercises right away. Do not do them again unless your provider says that you can. Get help right away if: You develop sudden, severe hand pain or swelling. If this happens, stop doing these exercises right away. Do not do them again unless your provider says that you can. This information is not intended to replace advice given to you by your health care provider. Make sure you discuss any questions you have with your health care provider. Document Revised: 11/08/2022 Document Reviewed: 11/08/2022 Elsevier Patient Education  2024 [employer].

## 2024-08-22 DIAGNOSIS — L299 Pruritus, unspecified: Secondary | ICD-10-CM | POA: Diagnosis not present

## 2024-08-22 DIAGNOSIS — L821 Other seborrheic keratosis: Secondary | ICD-10-CM | POA: Diagnosis not present

## 2024-08-29 ENCOUNTER — Ambulatory Visit (HOSPITAL_BASED_OUTPATIENT_CLINIC_OR_DEPARTMENT_OTHER): Payer: Medicare Other | Admitting: Obstetrics & Gynecology

## 2024-08-30 DIAGNOSIS — Z961 Presence of intraocular lens: Secondary | ICD-10-CM | POA: Diagnosis not present

## 2024-08-30 DIAGNOSIS — H18511 Endothelial corneal dystrophy, right eye: Secondary | ICD-10-CM | POA: Diagnosis not present

## 2024-08-30 DIAGNOSIS — Z947 Corneal transplant status: Secondary | ICD-10-CM | POA: Diagnosis not present

## 2024-08-30 DIAGNOSIS — L718 Other rosacea: Secondary | ICD-10-CM | POA: Diagnosis not present

## 2024-09-04 DIAGNOSIS — L821 Other seborrheic keratosis: Secondary | ICD-10-CM | POA: Diagnosis not present

## 2024-09-04 DIAGNOSIS — L814 Other melanin hyperpigmentation: Secondary | ICD-10-CM | POA: Diagnosis not present

## 2024-09-04 DIAGNOSIS — L299 Pruritus, unspecified: Secondary | ICD-10-CM | POA: Diagnosis not present

## 2024-09-04 DIAGNOSIS — D2272 Melanocytic nevi of left lower limb, including hip: Secondary | ICD-10-CM | POA: Diagnosis not present

## 2024-09-04 DIAGNOSIS — Z85828 Personal history of other malignant neoplasm of skin: Secondary | ICD-10-CM | POA: Diagnosis not present

## 2024-09-24 ENCOUNTER — Ambulatory Visit (HOSPITAL_BASED_OUTPATIENT_CLINIC_OR_DEPARTMENT_OTHER): Admitting: Obstetrics & Gynecology

## 2024-10-06 ENCOUNTER — Other Ambulatory Visit: Payer: Self-pay | Admitting: Physician Assistant

## 2024-10-07 NOTE — Telephone Encounter (Signed)
 Last Fill: 07/04/2024  Next Visit: 02/18/2025  Last Visit: 08/20/2024  Dx: Fibromyalgia   Current Dose per office note on 08/20/2024: Cymbalta  30 mg 1 capsule by mouth daily   Okay to refill Cymbalta ?

## 2024-10-09 DIAGNOSIS — Z947 Corneal transplant status: Secondary | ICD-10-CM | POA: Diagnosis not present

## 2024-10-09 DIAGNOSIS — L718 Other rosacea: Secondary | ICD-10-CM | POA: Diagnosis not present

## 2024-10-09 DIAGNOSIS — Z961 Presence of intraocular lens: Secondary | ICD-10-CM | POA: Diagnosis not present

## 2024-10-09 DIAGNOSIS — H18511 Endothelial corneal dystrophy, right eye: Secondary | ICD-10-CM | POA: Diagnosis not present

## 2024-10-16 ENCOUNTER — Ambulatory Visit (HOSPITAL_BASED_OUTPATIENT_CLINIC_OR_DEPARTMENT_OTHER): Admitting: Obstetrics & Gynecology

## 2024-11-08 ENCOUNTER — Telehealth: Payer: Self-pay | Admitting: *Deleted

## 2024-11-08 NOTE — Telephone Encounter (Signed)
 Patient contacted the office and left message stating she was in the office during the fall and was having trouble with her knees. Patient states she is calling about a referral. Per last office note: She feels instability in her knee joints at times. I will refer her to orthopedics.   Okay to place referral? Where do you want to refer her to?

## 2024-11-10 NOTE — Telephone Encounter (Signed)
 Okay to refer her to Ortho care or an orthopedic surgeon of her choice.

## 2024-11-11 NOTE — Telephone Encounter (Signed)
 Attempted to contact the patient and left message for patient to call the office.

## 2024-11-12 NOTE — Telephone Encounter (Signed)
 Patient spoke with Reena and she will decide which ortho office she would like to go to and will call the office to let us  know.

## 2024-12-03 ENCOUNTER — Encounter (HOSPITAL_BASED_OUTPATIENT_CLINIC_OR_DEPARTMENT_OTHER): Payer: Self-pay | Admitting: Obstetrics & Gynecology

## 2024-12-03 ENCOUNTER — Ambulatory Visit (INDEPENDENT_AMBULATORY_CARE_PROVIDER_SITE_OTHER): Admitting: Obstetrics & Gynecology

## 2024-12-03 ENCOUNTER — Other Ambulatory Visit (HOSPITAL_COMMUNITY)
Admission: RE | Admit: 2024-12-03 | Discharge: 2024-12-03 | Disposition: A | Discharge status: Home / Self Care | Source: Ambulatory Visit | Attending: Obstetrics & Gynecology | Admitting: Obstetrics & Gynecology

## 2024-12-03 VITALS — BP 127/81 | HR 102 | Ht 67.5 in | Wt 161.4 lb

## 2024-12-03 DIAGNOSIS — Z1331 Encounter for screening for depression: Secondary | ICD-10-CM

## 2024-12-03 DIAGNOSIS — R232 Flushing: Secondary | ICD-10-CM

## 2024-12-03 DIAGNOSIS — Z79899 Other long term (current) drug therapy: Secondary | ICD-10-CM

## 2024-12-03 DIAGNOSIS — M85852 Other specified disorders of bone density and structure, left thigh: Secondary | ICD-10-CM

## 2024-12-03 DIAGNOSIS — Z01419 Encounter for gynecological examination (general) (routine) without abnormal findings: Secondary | ICD-10-CM | POA: Diagnosis not present

## 2024-12-03 DIAGNOSIS — Z124 Encounter for screening for malignant neoplasm of cervix: Secondary | ICD-10-CM

## 2024-12-03 DIAGNOSIS — Z90722 Acquired absence of ovaries, bilateral: Secondary | ICD-10-CM | POA: Diagnosis not present

## 2024-12-03 DIAGNOSIS — M85851 Other specified disorders of bone density and structure, right thigh: Secondary | ICD-10-CM

## 2024-12-03 DIAGNOSIS — Z9079 Acquired absence of other genital organ(s): Secondary | ICD-10-CM

## 2024-12-03 DIAGNOSIS — K625 Hemorrhage of anus and rectum: Secondary | ICD-10-CM

## 2024-12-03 LAB — HEPATIC FUNCTION PANEL
ALT: 15 [IU]/L (ref 0–32)
AST: 23 [IU]/L (ref 0–40)
Albumin: 4.7 g/dL (ref 3.8–4.8)
Alkaline Phosphatase: 82 [IU]/L (ref 49–135)
Bilirubin Total: 0.6 mg/dL (ref 0.0–1.2)
Bilirubin, Direct: 0.19 mg/dL (ref 0.00–0.40)
Total Protein: 7.2 g/dL (ref 6.0–8.5)

## 2024-12-03 NOTE — Patient Instructions (Signed)
.  msmmm

## 2024-12-03 NOTE — Progress Notes (Signed)
 "  Breast and Pelvic Exam.   Patient name: Courtney Waters MRN [id number]  Date of birth: [birth year] Chief Complaint:   Gynecologic Exam  History of Present Illness:   Courtney Waters is a 69 y.o. G50P2002 Caucasian female being seen today for breast and pelvic exam.  Denies vaginal bleeding.  Frustrated with weight.  Exercising.  Diagnosed with corneal dystrophy in February of last year. 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 Right eye is scheduled for March 12th.   She also started taking new medication Sulindac in February for Fibromyalgia. In July she started itching both hands, no rash. She was prescribed a regimen of medications, including Benadryl  that she has since stopped taking due to concerns with links to Dementia. She is taking Zytrec to help with itchy hands now.   Biggest frustration is hot flashes.  She has tried gabapentin  in the past but didn't like the side effects.  Veozah discussed.  Baseline liver enzymes recommended.    Seeing Dr. Lyndle physical therapist due to knee pain/issues.  She stopped tumeric but has restarted and it is improved now that she has restarted the tumeric.    She's having bright red bleeding with bowel movements.  H/o hemorrhoids.  She's noticed this every day for the past 10 days.  She left a message for Dr. Nola office yesterday.  Patient's last menstrual period was 11/07/2009.  Last pap 08/15/2022. Results were: NILM w/ HRHPV not done. H/O abnormal pap: no Last mammogram: 07/30/2024. Results were: normal. Family h/o breast cancer: no Last colonoscopy: 07/10/2019. Results were: abnormal 1 polyp. Family h/o colorectal cancer: no.  Was due last year.   DEXA:  08/20/2020. T score -1.3     08/15/2022    1:57 PM 08/12/2021    2:59 PM  Depression screen PHQ 2/9  Decreased Interest 0 0  Down, Depressed, Hopeless 0 0  PHQ - 2 Score 0 0    Review of Systems:   Pertinent items are noted in HPI Denies any pelvic pain, urinary or  bowel changes.   Pertinent History Reviewed:  Reviewed past medical,surgical, social and family history.  Reviewed problem list, medications and allergies. Physical Assessment:   Vitals:   12/03/24 1327  BP: 127/81  Pulse: (!) 102  SpO2: 97%  Weight: 161 lb 6.4 oz (73.2 kg)  Height: 5' 7.5 (1.715 m)  Body mass index is 24.91 kg/m.        Physical Examination:   General appearance - well appearing, and in no distress  Mental status - alert, oriented to person, place, and time  Psych:  She has a normal mood and affect  Skin - warm and dry, normal color, no suspicious lesions noted  Chest - effort normal, all lung fields clear to auscultation bilaterally  Heart - normal rate and regular rhythm  Neck:  midline trachea, no thyromegaly or nodules  Breasts - breasts appear normal, no suspicious masses, no skin or nipple changes or  axillary nodes  Abdomen - soft, nontender, nondistended, no masses or organomegaly  Pelvic - VULVA: normal appearing vulva with no masses, tenderness or lesions   VAGINA: normal appearing vagina with normal color and discharge, no lesions   CERVIX: normal appearing cervix without discharge or lesions, no CMT  Thin prep pap is obtained today  UTERUS: uterus is felt to be normal size, shape, consistency and nontender   ADNEXA: No adnexal masses or tenderness noted.  Rectal - normal rectal, good sphincter tone,  no masses felt  Extremities:  No swelling or varicosities noted  Chaperone present for exam  No results found for this or any previous visit (from the past 24 hours).  Assessment & Plan:  1. Encntr for gyn exam (general) (routine) w/o abn findings (Primary) - Pap smear updated today - Mammogram 07/2024 - Colonoscopy 2020.  Pt thinks her colonoscopy was due last year.  She has already called Dr. Nola office - Bone mineral density ordered today - lab work done with PCP, Dr. Jakie - vaccines reviewed/updated  2. Cervical cancer screening -  Cytology - PAP( [place]) - PR OBTAINING SCREEN PAP SMEAR  3. High risk medication use - Hepatic function panel  4. Osteopenia of necks of both femurs - taking Vit D - Repeat DEXA ordered to be done this year - DG Bone Density; Future  5. Rectal bleeding - pt has already called her GI for appt  6. S/P BSO (bilateral salpingo-oophorectomy)  7. Hot flashes - will try veozah.  Samples given.  She will give update in two weeks.     Orders Placed This Encounter  Procedures   DG Bone Density   Hepatic function panel   PR OBTAINING SCREEN PAP SMEAR    Meds: No orders of the defined types were placed in this encounter.   Follow-up: Return in about 1 year (around 12/03/2025).  Ronal GORMAN Pinal, MD 12/03/2024 2:05 PM "

## 2024-12-04 ENCOUNTER — Ambulatory Visit (HOSPITAL_BASED_OUTPATIENT_CLINIC_OR_DEPARTMENT_OTHER): Payer: Self-pay | Admitting: Obstetrics & Gynecology

## 2024-12-04 LAB — CYTOLOGY - PAP: Diagnosis: NEGATIVE

## 2025-01-02 ENCOUNTER — Other Ambulatory Visit (HOSPITAL_BASED_OUTPATIENT_CLINIC_OR_DEPARTMENT_OTHER)

## 2025-02-18 ENCOUNTER — Ambulatory Visit: Admitting: Physician Assistant

## 2025-02-19 ENCOUNTER — Ambulatory Visit (HOSPITAL_BASED_OUTPATIENT_CLINIC_OR_DEPARTMENT_OTHER): Admitting: Obstetrics & Gynecology
